# Patient Record
Sex: Male | Born: 1945 | Race: White | Hispanic: No | Marital: Single | Smoking: Current some day smoker
Health system: Southern US, Community
[De-identification: ages and names within clinical notes are randomized; demographics above are authoritative.]

## PROBLEM LIST (undated history)

## (undated) DIAGNOSIS — C801 Malignant (primary) neoplasm, unspecified: Secondary | ICD-10-CM

## (undated) DIAGNOSIS — R29898 Other symptoms and signs involving the musculoskeletal system: Secondary | ICD-10-CM

## (undated) DIAGNOSIS — I7121 Aneurysm of the ascending aorta, without rupture: Secondary | ICD-10-CM

## (undated) DIAGNOSIS — I10 Essential (primary) hypertension: Secondary | ICD-10-CM

## (undated) DIAGNOSIS — I5022 Chronic systolic (congestive) heart failure: Secondary | ICD-10-CM

## (undated) DIAGNOSIS — I4891 Unspecified atrial fibrillation: Secondary | ICD-10-CM

## (undated) DIAGNOSIS — J392 Other diseases of pharynx: Secondary | ICD-10-CM

## (undated) DIAGNOSIS — I429 Cardiomyopathy, unspecified: Secondary | ICD-10-CM

## (undated) DIAGNOSIS — I4892 Unspecified atrial flutter: Secondary | ICD-10-CM

## (undated) DIAGNOSIS — I712 Thoracic aortic aneurysm, without rupture: Secondary | ICD-10-CM

## (undated) DIAGNOSIS — I6521 Occlusion and stenosis of right carotid artery: Secondary | ICD-10-CM

## (undated) DIAGNOSIS — C61 Malignant neoplasm of prostate: Secondary | ICD-10-CM

## (undated) DIAGNOSIS — R1310 Dysphagia, unspecified: Secondary | ICD-10-CM

## (undated) DIAGNOSIS — G459 Transient cerebral ischemic attack, unspecified: Secondary | ICD-10-CM

## (undated) DIAGNOSIS — C760 Malignant neoplasm of head, face and neck: Secondary | ICD-10-CM

## (undated) DIAGNOSIS — IMO0002 Reserved for concepts with insufficient information to code with codable children: Secondary | ICD-10-CM

## (undated) HISTORY — DX: Cardiomyopathy, unspecified: I42.9

## (undated) HISTORY — DX: Malignant neoplasm of prostate: C61

## (undated) HISTORY — DX: Aneurysm of the ascending aorta, without rupture: I71.21

## (undated) HISTORY — DX: Transient cerebral ischemic attack, unspecified: G45.9

## (undated) HISTORY — DX: Chronic systolic (congestive) heart failure: I50.22

## (undated) HISTORY — DX: Dysphagia, unspecified: R13.10

## (undated) HISTORY — DX: Occlusion and stenosis of right carotid artery: I65.21

## (undated) HISTORY — DX: Malignant neoplasm of head, face and neck: C76.0

## (undated) HISTORY — DX: Other diseases of pharynx: J39.2

## (undated) HISTORY — DX: Other symptoms and signs involving the musculoskeletal system: R29.898

## (undated) HISTORY — DX: Thoracic aortic aneurysm, without rupture: I71.2

---

## 2000-12-09 DIAGNOSIS — C61 Malignant neoplasm of prostate: Secondary | ICD-10-CM

## 2000-12-09 HISTORY — PX: PROSTATECTOMY: SHX69

## 2000-12-09 HISTORY — DX: Malignant neoplasm of prostate: C61

## 2011-09-09 DIAGNOSIS — IMO0001 Reserved for inherently not codable concepts without codable children: Secondary | ICD-10-CM

## 2011-09-09 HISTORY — DX: Reserved for inherently not codable concepts without codable children: IMO0001

## 2014-10-25 ENCOUNTER — Encounter (HOSPITAL_COMMUNITY): Payer: Self-pay | Admitting: Emergency Medicine

## 2014-10-25 ENCOUNTER — Emergency Department (HOSPITAL_COMMUNITY): Payer: Medicare Other

## 2014-10-25 ENCOUNTER — Emergency Department (HOSPITAL_COMMUNITY)
Admission: EM | Admit: 2014-10-25 | Discharge: 2014-10-25 | Disposition: A | Discharge status: Home / Self Care | Payer: Medicare Other | Attending: Emergency Medicine | Admitting: Emergency Medicine

## 2014-10-25 DIAGNOSIS — Z859 Personal history of malignant neoplasm, unspecified: Secondary | ICD-10-CM | POA: Insufficient documentation

## 2014-10-25 DIAGNOSIS — I10 Essential (primary) hypertension: Secondary | ICD-10-CM | POA: Diagnosis not present

## 2014-10-25 DIAGNOSIS — Z79899 Other long term (current) drug therapy: Secondary | ICD-10-CM | POA: Insufficient documentation

## 2014-10-25 DIAGNOSIS — I4892 Unspecified atrial flutter: Secondary | ICD-10-CM | POA: Diagnosis not present

## 2014-10-25 DIAGNOSIS — Z880 Allergy status to penicillin: Secondary | ICD-10-CM | POA: Insufficient documentation

## 2014-10-25 DIAGNOSIS — R000 Tachycardia, unspecified: Secondary | ICD-10-CM | POA: Diagnosis present

## 2014-10-25 HISTORY — DX: Essential (primary) hypertension: I10

## 2014-10-25 HISTORY — DX: Unspecified atrial fibrillation: I48.91

## 2014-10-25 HISTORY — DX: Unspecified atrial flutter: I48.92

## 2014-10-25 HISTORY — DX: Reserved for concepts with insufficient information to code with codable children: IMO0002

## 2014-10-25 HISTORY — DX: Malignant (primary) neoplasm, unspecified: C80.1

## 2014-10-25 LAB — COMPREHENSIVE METABOLIC PANEL
ALT: 28 U/L (ref 0–53)
ANION GAP: 15 (ref 5–15)
AST: 34 U/L (ref 0–37)
Albumin: 3.6 g/dL (ref 3.5–5.2)
Alkaline Phosphatase: 79 U/L (ref 39–117)
BUN: 10 mg/dL (ref 6–23)
CHLORIDE: 100 meq/L (ref 96–112)
CO2: 24 mEq/L (ref 19–32)
CREATININE: 0.82 mg/dL (ref 0.50–1.35)
Calcium: 9.1 mg/dL (ref 8.4–10.5)
GFR calc Af Amer: >90 mL/min (ref >90)
GFR, EST NON AFRICAN AMERICAN: 89 mL/min — AB (ref >90)
GLUCOSE: 94 mg/dL (ref 70–99)
Potassium: 4.1 mEq/L (ref 3.7–5.3)
Sodium: 139 mEq/L (ref 137–147)
Total Bilirubin: 0.8 mg/dL (ref 0.3–1.2)
Total Protein: 6.7 g/dL (ref 6.0–8.3)

## 2014-10-25 LAB — CBC WITH DIFFERENTIAL/PLATELET
Basophils Absolute: 0 10*3/uL (ref 0.0–0.1)
Basophils Relative: 0 % (ref 0–1)
Eosinophils Absolute: 0.1 10*3/uL (ref 0.0–0.7)
Eosinophils Relative: 1 % (ref 0–5)
HEMATOCRIT: 45.1 % (ref 39.0–52.0)
HEMOGLOBIN: 15.5 g/dL (ref 13.0–17.0)
LYMPHS ABS: 0.8 10*3/uL (ref 0.7–4.0)
LYMPHS PCT: 13 % (ref 12–46)
MCH: 35.1 pg — ABNORMAL HIGH (ref 26.0–34.0)
MCHC: 34.4 g/dL (ref 30.0–36.0)
MCV: 102.3 fL — ABNORMAL HIGH (ref 78.0–100.0)
MONO ABS: 0.9 10*3/uL (ref 0.1–1.0)
MONOS PCT: 14 % — AB (ref 3–12)
NEUTROS ABS: 4.3 10*3/uL (ref 1.7–7.7)
Neutrophils Relative %: 72 % (ref 43–77)
Platelets: 127 10*3/uL — ABNORMAL LOW (ref 150–400)
RBC: 4.41 MIL/uL (ref 4.22–5.81)
RDW: 12.7 % (ref 11.5–15.5)
WBC: 6 10*3/uL (ref 4.0–10.5)

## 2014-10-25 LAB — I-STAT TROPONIN, ED: Troponin i, poc: 0.01 ng/mL (ref 0.00–0.08)

## 2014-10-25 MED ORDER — SODIUM CHLORIDE 0.9 % IV BOLUS (SEPSIS)
1000.0000 mL | Freq: Once | INTRAVENOUS | Status: DC
Start: 2014-10-25 — End: 2014-10-25

## 2014-10-25 MED ORDER — METOPROLOL TARTRATE 25 MG PO TABS: 25.0000 mg | ORAL_TABLET | Freq: Once | ORAL | Status: DC

## 2014-10-25 MED ORDER — METOPROLOL TARTRATE 25 MG PO TABS
12.5000 mg | ORAL_TABLET | Freq: Once | ORAL | Status: AC
  Administered 2014-10-25: 12.5 mg via ORAL
  Filled 2014-10-25: qty 1

## 2014-10-25 MED ORDER — METOPROLOL TARTRATE 25 MG PO TABS: 12.5000 mg | ORAL_TABLET | Freq: Every day | ORAL | Status: DC

## 2014-10-25 NOTE — Discharge Instructions (Signed)
Atrial Flutter °Atrial flutter is a heart rhythm that can cause the heart to beat very fast (tachycardia). It originates in the upper chambers of the heart (atria). In atrial flutter, the top chambers of the heart (atria) often beat much faster than the bottom chambers of the heart (ventricles). Atrial flutter has a regular "saw toothed" appearance in an EKG readout. An EKG is a test that records the electrical activity of the heart. Atrial flutter can cause the heart to beat up to 150 beats per minute (BPM). Atrial flutter can either be short lived (paroxysmal) or permanent.  °CAUSES  °Causes of atrial flutter can be many. Some of these include: °· Heart related issues: °¨ Heart attack (myocardial infarction). °¨ Heart failure. °¨ Heart valve problems. °¨ Poorly controlled high blood pressure (hypertension). °¨ After open heart surgery. °· Lung related issues: °¨ A blood clot in the lungs (pulmonary embolism). °¨ Chronic obstructive pulmonary disease (COPD). Medications used to treat COPD can attribute to atrial flutter. °· Other related causes: °¨ Hyperthyroidism. °¨ Caffeine. °¨ Some decongestant cold medications. °¨ Low electrolyte levels such as potassium or magnesium. °¨ Cocaine. °SYMPTOMS °· An awareness of your heart beating rapidly (palpitations). °· Shortness of breath. °· Chest pain. °· Low blood pressure (hypotension). °· Dizziness or fainting. °DIAGNOSIS  °Different tests can be performed to diagnose atrial flutter.  °· An EKG. °· Holter monitor. This is a 24-hour recording of your heart rhythm. You will also be given a diary. Write down all symptoms that you have and what you were doing at the time you experienced symptoms. °· Cardiac event monitor. This small device can be worn for up to 30 days. When you have heart symptoms, you will push a button on the device. This will then record your heart rhythm. °· Echocardiogram. This is an imaging test to look at your heart. Your caregiver will look at your  heart valves and the ventricles. °· Stress test. This test can help determine if the atrial flutter is related to exercise or if coronary artery disease is present. °· Laboratory studies will look at certain blood levels like: °¨ Complete blood count (CBC). °¨ Potassium. °¨ Magnesium. °¨ Thyroid function. °TREATMENT  °Treatment of atrial flutter varies. A combination of therapies may be used or sometimes atrial flutter may need only 1 type of treatment.  °Lab work: °If your blood work, such as your electrolytes (potassium, magnesium) or your thyroid function tests, are abnormal, your caregiver will treat them accordingly.  °Medication:  °There are several different types of medications that can convert your heart to a normal rhythm and prevent atrial flutter from reoccurring.  °Nonsurgical procedures: °Nonsurgical techniques may be used to control atrial flutter. Some examples include: °· Cardioversion. This technique uses either drugs or an electrical shock to restore a normal heart rhythm: °¨ Cardioversion drugs may be given through an intravenous (IV) line to help "reset" the heart rhythm. °¨ In electrical cardioversion, your caregiver shocks your heart with electrical energy. This helps to reset the heartbeat to a normal rhythm. °· Ablation. If atrial flutter is a persistent problem, an ablation may be needed. This procedure is done under mild sedation. High frequency radio-wave energy is used to destroy the area of heart tissue responsible for atrial flutter. °SEEK IMMEDIATE MEDICAL CARE IF:  °You have: °· Dizziness. °· Near fainting or fainting. °· Shortness of breath. °· Chest pain or pressure. °· Sudden nausea or vomiting. °· Profuse sweating. °If you have the above symptoms,   call your local emergency service immediately! Do not drive yourself to the hospital. °MAKE SURE YOU:  °· Understand these instructions. °· Will watch your condition. °· Will get help right away if you are not doing well or get  worse. °Document Released: 04/13/2009 Document Revised: 04/11/2014 Document Reviewed: 04/13/2009 °[employer]® Patient Information ©2015 [employer]. This information is not intended to replace advice given to you by your health care provider. Make sure you discuss any questions you have with your health care provider. ° °

## 2014-10-25 NOTE — ED Provider Notes (Signed)
CSN: [id number]     Arrival date & time 10/25/14  1515 History   First MD Initiated Contact with Patient 10/25/14 1517     Chief Complaint  Patient presents with  . Tachycardia     (Consider location/radiation/quality/duration/timing/severity/associated sxs/prior Treatment) Patient is a 68 y.o. male presenting with dizziness. The history is provided by the patient. No language interpreter was used.  Dizziness Quality:  Lightheadedness Severity:  Moderate Onset quality:  Gradual Duration:  3 days Timing:  Constant Progression:  Resolved Chronicity:  Recurrent Context: bending over   Relieved by:  None tried Worsened by:  Standing up Ineffective treatments:  None tried Associated symptoms: no blood in stool, no chest pain, no headaches, no palpitations, no shortness of breath, no vision changes and no weakness   Risk factors: no hx of vertigo   Risk factors comment:  Missed metoprolol x 3 days   Past Medical History  Diagnosis Date  . A-fib   . Atrial flutter   . Hypertension   . Cancer     behind L ear  . Radiation    History reviewed. No pertinent past surgical history. History reviewed. No pertinent family history. History  Substance Use Topics  . Smoking status: Never Smoker   . Smokeless tobacco: Never Used  . Alcohol Use: Not on file    Review of Systems  Respiratory: Negative for shortness of breath.   Cardiovascular: Negative for chest pain and palpitations.  Gastrointestinal: Negative for blood in stool.  Neurological: Positive for dizziness. Negative for headaches.      Allergies  Penicillins  Home Medications   Prior to Admission medications   Medication Sig Start Date End Date Taking? Authorizing Provider  metoprolol tartrate (LOPRESSOR) 25 MG tablet Take 25 mg by mouth 2 (two) times daily.   Yes Historical Provider, MD   BP 117/78 mmHg  Pulse 73  Temp(Src) 98.3 F (36.8 C) (Oral)  SpO2 99% Physical Exam  Constitutional: He is oriented  to person, place, and time. He appears well-developed and well-nourished. No distress.  HENT:  Head: Normocephalic and atraumatic.  Eyes: EOM are normal. Pupils are equal, round, and reactive to light.  Neck: No JVD present.  Cardiovascular: Normal rate, regular rhythm, normal heart sounds and intact distal pulses.   Pulmonary/Chest: Effort normal and breath sounds normal.  Abdominal: Soft. He exhibits no distension. There is no tenderness.  Musculoskeletal: He exhibits no edema.  Neurological: He is alert and oriented to person, place, and time.  Skin: Skin is warm.  Vitals reviewed.   ED Course  Procedures (including critical care time) Labs Review Labs Reviewed  CBC WITH DIFFERENTIAL - Abnormal; Notable for the following:    MCV 102.3 (*)    MCH 35.1 (*)    Platelets 127 (*)    Monocytes Relative 14 (*)    All other components within normal limits  COMPREHENSIVE METABOLIC PANEL - Abnormal; Notable for the following:    GFR calc non Af Amer 89 (*)    All other components within normal limits  Randolm Idol, ED    Imaging Review Dg Chest Port 1 View  10/25/2014   CLINICAL DATA:  68 year old male with syncope, shortness of breath, fever and productive cough.  EXAM: PORTABLE CHEST - 1 VIEW  COMPARISON:  None  FINDINGS: Metallic artifact overlies the chest in the formal multiple cardiac leads. Borderline cardiomegaly. Trace atherosclerotic calcification in the transverse aorta. No focal airspace opacity. Left retrocardiac region not well seen. No  evidence of pulmonary edema, pleural effusion or pneumothorax. No acute osseous abnormality.  IMPRESSION: Borderline cardiomegaly.  Aortic atherosclerosis.  No definite focal airspace consolidation. Recommend dedicated PA and lateral chest x-ray when the patient is able in order to clear the left retrocardiac region.   Electronically Signed   By: Jacqulynn Cadet M.D.   On: 10/25/2014 17:01     EKG Interpretation   Date/Time:   Tuesday October 25 2014 15:22:36 EST Ventricular Rate:  77 PR Interval:  207 QRS Duration: 94 QT Interval:  420 QTC Calculation: 475 R Axis:   -14 Text Interpretation:  Sinus rhythm Multiple premature complexes, vent   RSR' in V1 or V2, right VCD or RVH Borderline prolonged QT interval No  previous tracing Confirmed by POLLINA  MD, CHRISTOPHER [id number]) on  10/25/2014 3:41:52 PM      MDM   Final diagnoses:  Episodic atrial flutter    68 y/o male with h/o atrial flutter presenting with episode of dizziness causing him to fall forward. EKG by EMS showing atrial flutter w/ rate 150s. Now resolved to sinus rhythm. Pt now asymptomatic. No chest pain, SOB. Also denies cough, fever, sputum production. He did not hit head and caught self with hands. No wrist/hand pain. Well appearing. Labs reassuring. No recurrence of symptoms or tachycardia. He had been out of metoprolol x 3-4 days. Will give home dose here and provide refill. Will F/u with Cardiology.     Amparo Bristol, MD 10/25/14 0881  Orpah Greek, MD 10/26/14 [phone]

## 2014-10-25 NOTE — Care Management (Addendum)
ED CM received referral from [facility] concerning medication assistance. Reviewed record patient coverage is [employer], No PCP listed  In record. Met with patient at bedside, confirmed information. Patient states, he wants to know will he he received an ED bill or will his insurance company received this bill. Explained that bill can take over 30 days but, he can contact the account payable office for more detailed information on the billing process, and  discussed the process for finding a PCP with [facility].  Patient verbalized understanding and appreciation for the assistance. Updated Courtney, and no further CM needs identified.

## 2014-10-25 NOTE — ED Notes (Signed)
Per EMS pt was at grocery store felt really dizzy while he was walking and fell. Denies LOC & hitting head. EMS report run of SVT en route and converted w/o medicine. Pt has hx of afib/aflutter.

## 2014-10-25 NOTE — ED Provider Notes (Signed)
Patient presented to the ER with sudden onset of dizziness. Patient brought to the emergency department by ambulance. Patient had an episode of atrial fibrillation during transport, resolved prior to arrival. Patient now without symptoms.  Face to face Exam: HEENT - PERRLA Lungs - CTAB Heart - RRR, no M/R/G Abd - S/NT/ND Neuro - alert, oriented x3  Plan: Basic labs, monitor for recurrence of tachycardia.  Orpah Greek, MD 10/25/14 [phone]

## 2015-08-15 ENCOUNTER — Ambulatory Visit: Payer: Self-pay | Admitting: Cardiovascular Disease

## 2016-11-06 ENCOUNTER — Encounter (HOSPITAL_COMMUNITY): Payer: Self-pay | Admitting: Emergency Medicine

## 2016-11-06 ENCOUNTER — Ambulatory Visit (HOSPITAL_COMMUNITY)
Admission: EM | Admit: 2016-11-06 | Discharge: 2016-11-06 | Disposition: A | Discharge status: Home / Self Care | Payer: Medicare Other | Attending: Family Medicine | Admitting: Family Medicine

## 2016-11-06 DIAGNOSIS — H9201 Otalgia, right ear: Secondary | ICD-10-CM | POA: Diagnosis not present

## 2016-11-06 DIAGNOSIS — H6123 Impacted cerumen, bilateral: Secondary | ICD-10-CM

## 2016-11-06 DIAGNOSIS — K047 Periapical abscess without sinus: Secondary | ICD-10-CM

## 2016-11-06 MED ORDER — CLINDAMYCIN HCL 300 MG PO CAPS: 300.0000 mg | ORAL_CAPSULE | Freq: Three times a day (TID) | ORAL | 0 refills | Status: AC

## 2016-11-06 MED ORDER — ATORVASTATIN CALCIUM 20 MG PO TABS: 20.0000 mg | ORAL_TABLET | Freq: Every day | ORAL | 0 refills | Status: AC

## 2016-11-06 MED ORDER — IBUPROFEN 800 MG PO TABS: 800.0000 mg | ORAL_TABLET | Freq: Three times a day (TID) | ORAL | 0 refills | Status: DC

## 2016-11-06 MED ORDER — METOPROLOL SUCCINATE ER 25 MG PO TB24: 12.5000 mg | ORAL_TABLET | Freq: Every day | ORAL | 0 refills | Status: DC

## 2016-11-06 MED ORDER — LISINOPRIL 2.5 MG PO TABS: 2.5000 mg | ORAL_TABLET | Freq: Every day | ORAL | 0 refills | Status: DC

## 2016-11-06 MED ORDER — ATORVASTATIN CALCIUM 20 MG PO TABS: 20.0000 mg | ORAL_TABLET | Freq: Every day | ORAL | 0 refills | Status: DC

## 2016-11-06 NOTE — ED Triage Notes (Signed)
PT pulled his own tooth on the right side a few days ago. PT's hearing on right side has worsened since. PT also reports decreased hearing on left side. PT also report pain in ear

## 2016-11-06 NOTE — ED Provider Notes (Signed)
CSN: [id number]     Arrival date & time 11/06/16  1706 History   First MD Initiated Contact with Patient 11/06/16 1833     Chief Complaint  Patient presents with  . Ear Fullness   (Consider location/radiation/quality/duration/timing/severity/associated sxs/prior Treatment) Patient presents today for right ear pain, right ear feeling full and right side dental pain that radiates up. Patient pulled his own tooth 10 days ago because "it was getting loose" and noticed the ear pain came on 8 days ago. He also reports hearing loss and right ear feels full. Patient denies fever. He endorses difficulty hearing. He denies tinnitus.       Past Medical History:  Diagnosis Date  . A-fib ([place])   . Atrial flutter ([place])   . Cancer ([place])    behind L ear  . Hypertension   . Radiation    History reviewed. No pertinent surgical history. No family history on file. Social History  Substance Use Topics  . Smoking status: Current Some Day Smoker  . Smokeless tobacco: Never Used  . Alcohol use Yes     Comment: 2-3 drinks per day    Review of Systems  Constitutional: Negative for chills, fatigue and fever.  HENT: Positive for dental problem and ear pain. Negative for congestion, ear discharge and tinnitus.        +hearing loss  Respiratory: Negative for cough and shortness of breath.   Cardiovascular: Negative for chest pain, palpitations and leg swelling.  Gastrointestinal: Negative for abdominal pain, nausea and vomiting.  Neurological: Negative for dizziness and headaches.    Allergies  Fish allergy and Penicillins  Home Medications   Prior to Admission medications   Medication Sig Start Date End Date Taking? Authorizing Provider  atorvastatin (LIPITOR) 20 MG tablet Take 1 tablet (20 mg total) by mouth daily. 11/06/16 02/04/17  Barry Dienes, NP  b complex vitamins tablet Take 1 tablet by mouth daily.    Historical Provider, MD  clindamycin (CLEOCIN) 300 MG capsule Take 1 capsule (300 mg  total) by mouth 3 (three) times daily. 11/06/16 11/13/16  Barry Dienes, NP  clonazePAM (KLONOPIN) 0.5 MG tablet Take 0.5 mg by mouth 2 (two) times daily as needed for anxiety.    Historical Provider, MD  ibuprofen (ADVIL,MOTRIN) 800 MG tablet Take 1 tablet (800 mg total) by mouth 3 (three) times daily. 11/06/16   Barry Dienes, NP  lisinopril (ZESTRIL) 2.5 MG tablet Take 1 tablet (2.5 mg total) by mouth daily. 11/06/16 02/04/17  Barry Dienes, NP  metoprolol succinate (TOPROL-XL) 25 MG 24 hr tablet Take 0.5 tablets (12.5 mg total) by mouth daily. 11/06/16 02/04/17  Barry Dienes, NP  metoprolol tartrate (LOPRESSOR) 25 MG tablet Take 0.5 tablets (12.5 mg total) by mouth daily. 10/25/14   Amparo Bristol, MD   Meds Ordered and Administered this Visit  Medications - No data to display  BP (!) 106/54   Pulse 100   Temp 98.6 F (37 C) (Temporal)   Resp 16   Ht 5\' 10"  (1.778 m)   Wt 180 lb (81.6 kg)   SpO2 95%   BMI 25.83 kg/m  No data found.   Physical Exam  Constitutional: He appears well-developed and well-nourished.  HENT:  Has several missing teeth on right lower side. Right lower gumline is red, inflamed and tender to palpate. Has no facial swelling. Cerumen impaction noted bilaterally  Cardiovascular: Normal rate, regular rhythm and normal heart sounds.   Pulmonary/Chest: Effort normal and breath sounds normal.  Abdominal:  Soft. Bowel sounds are normal.  Nursing note and vitals reviewed.   Urgent Care Course   Clinical Course     Procedures (including critical care time)  Labs Review Labs Reviewed - No data to display  Imaging Review No results found.   MDM   1. Dental infection   2. Right ear pain   3. Bilateral impacted cerumen    1) We cleaned your left ear out but unable to clean your right ear out, this is why you feel the fullness in your ear. You need to see an ENT doctor for ear wax removal  2)  You ear pain is most likely coming from your dental pain. I doubt you have  an ear infection.   3) Start taking the antibiotic for the dental infection. Please follow up with your dentist. Take ibuprofen for pain.   4) All you other medications have been refilled per your request.     Barry Dienes, NP 11/06/16 1921

## 2016-11-06 NOTE — Discharge Instructions (Signed)
1) We cleaned your left ear out but unable to clean your right ear out, this is why you feel the fullness in your ear. You need to see an ENT doctor for ear wax removal  2)  You ear pain is most likely coming from your dental pain. I doubt you have an ear infection.   3) Start taking the antibiotic for the dental infection. Please follow up with your dentist  4) All you other medications have been refilled.

## 2016-11-25 ENCOUNTER — Ambulatory Visit (HOSPITAL_COMMUNITY): Payer: Self-pay | Admitting: Dentistry

## 2016-11-25 ENCOUNTER — Encounter (HOSPITAL_COMMUNITY): Payer: Self-pay | Admitting: Dentistry

## 2016-11-25 VITALS — BP 77/57 | HR 77 | Temp 97.5°F

## 2016-11-25 DIAGNOSIS — K0530 Chronic periodontitis, unspecified: Secondary | ICD-10-CM

## 2016-11-25 DIAGNOSIS — Z923 Personal history of irradiation: Secondary | ICD-10-CM | POA: Diagnosis not present

## 2016-11-25 DIAGNOSIS — H9210 Otorrhea, unspecified ear: Secondary | ICD-10-CM | POA: Diagnosis not present

## 2016-11-25 DIAGNOSIS — K0889 Other specified disorders of teeth and supporting structures: Secondary | ICD-10-CM

## 2016-11-25 DIAGNOSIS — K045 Chronic apical periodontitis: Secondary | ICD-10-CM

## 2016-11-25 DIAGNOSIS — K08409 Partial loss of teeth, unspecified cause, unspecified class: Secondary | ICD-10-CM

## 2016-11-25 DIAGNOSIS — K029 Dental caries, unspecified: Secondary | ICD-10-CM

## 2016-11-25 DIAGNOSIS — K06020 Generalized gingival recession, unspecified: Secondary | ICD-10-CM

## 2016-11-25 DIAGNOSIS — K1379 Other lesions of oral mucosa: Secondary | ICD-10-CM

## 2016-11-25 DIAGNOSIS — C801 Malignant (primary) neoplasm, unspecified: Secondary | ICD-10-CM

## 2016-11-25 DIAGNOSIS — Z8589 Personal history of malignant neoplasm of other organs and systems: Secondary | ICD-10-CM | POA: Diagnosis not present

## 2016-11-25 DIAGNOSIS — K083 Retained dental root: Secondary | ICD-10-CM

## 2016-11-25 DIAGNOSIS — K0401 Reversible pulpitis: Secondary | ICD-10-CM

## 2016-11-25 DIAGNOSIS — M264 Malocclusion, unspecified: Secondary | ICD-10-CM

## 2016-11-25 DIAGNOSIS — Z9221 Personal history of antineoplastic chemotherapy: Secondary | ICD-10-CM

## 2016-11-25 DIAGNOSIS — K036 Deposits [accretions] on teeth: Secondary | ICD-10-CM

## 2016-11-26 NOTE — Progress Notes (Signed)
DENTAL CONSULTATION  Date of Consultation:  11/25/2016 Patient Name:   Tyler Holland Date of Birth:   28-Jul-1946 Medical Record Number: [id number]  VITALS: BP (!) 77/57 (BP Location: Left Arm, Cuff Size: Normal)   Pulse 77   Temp 97.5 F (36.4 C) (Oral)   CHIEF COMPLAINT: Patient referred by Dr. Constance Holster for a dental consultation.   HPI: Tyler Holland is a 70 year old male referred by Dr. Constance Holster for evaluation of the lower right swelling.  Patient indicates that he does have a history of acute pulpitis symptoms involving the lower right molar area. Patient points to the area of #32, although there are no teeth noted clinically. This acute pain has been occurring off and on for the past several weeks. Patient indicates that it causes sharp pain that reaches intensity of 9 out of 10. Pain last for hours at a time. Patient indicates it is currently 2 out of 10 in intensity. Patient also gives a history of having pulled a lower right tooth by himself approximately 3-4 weeks ago. Prior to that, the patient indicates that he had three teeth pulled from the lower left quadrant approximately 4 months ago. This was with Dr. Jerold Coombe. Dr. Dale [place] office was contacted and this treatment was actually found to have been done on 10/05/2014.  Patient indicates that he no longer has partial dentures. Last partial dentures were worn approximately 5 years ago. 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 This was approximately 5 years ago. This was evaluated and treated with chemoradiation therapy in [place], [place]. Patient indicates that he had 7 weeks of radiation therapy with 2 cycles of chemotherapy.  Patient indicates that he was treated by Dr. Guinevere Scarlet (Medical Oncology) at the [facility] in [place], [place] and is associated with the [facility] center. Release of information was obtained today to attempt to find records for this head and neck cancer and the treatment provided.  PROBLEM LIST: Patient Active Problem List   Diagnosis Date Noted  . Otorrhea 11/28/2016  o  PMH: Past Medical History:  Diagnosis Date  . A-fib ([place])   . Atrial flutter ([place])   . Cancer ([place])    behind L ear  . Hypertension   . Prostate cancer ([place]) 2002  . Status post radiation 09/2011    PSH: Past Surgical History:  Procedure Laterality Date  . PROSTATECTOMY  2002    ALLERGIES: Allergies  Allergen Reactions  . Fish Allergy Swelling    Fresh water fish caused throat to swell (child)  . Penicillins Other (See Comments)    Unknown childhood allergic reaction    MEDICATIONS: Current Outpatient Prescriptions  Medication Sig Dispense Refill  . atorvastatin (LIPITOR) 20 MG tablet Take 1 tablet (20 mg total) by mouth daily. 90 tablet 0  . b complex vitamins tablet Take 1 tablet by mouth daily.    . clonazePAM (KLONOPIN) 0.5 MG tablet Take 0.5 mg by mouth 2 (two) times daily as needed for anxiety.    Marland Kitchen lisinopril (ZESTRIL) 2.5 MG tablet Take 1 tablet (2.5 mg total) by mouth daily. 90 tablet 0  . metoprolol succinate (TOPROL-XL) 25 MG 24 hr tablet Take 0.5 tablets (12.5 mg total) by mouth daily. 45 tablet 0  . ibuprofen (ADVIL,MOTRIN) 800 MG tablet Take 1 tablet (800 mg total)  by mouth 3 (three) times daily. (Patient not taking: Reported on 11/25/2016) 21 tablet 0  . metoprolol tartrate (LOPRESSOR) 25 MG tablet Take 0.5 tablets (12.5 mg total) by mouth daily. 30 tablet 0   No current facility-administered medications for this visit.     LABS: Lab Results  Component Value Date   WBC 6.0 10/25/2014   HGB 15.5 10/25/2014   HCT 45.1 10/25/2014   MCV 102.3 (H) 10/25/2014   PLT 127 (L) 10/25/2014      Component Value Date/Time   NA 139 10/25/2014 1531   K 4.1 10/25/2014 1531   CL 100 10/25/2014 1531   CO2 24  10/25/2014 1531   GLUCOSE 94 10/25/2014 1531   BUN 10 10/25/2014 1531   CREATININE 0.82 10/25/2014 1531   CALCIUM 9.1 10/25/2014 1531   GFRNONAA 89 (L) 10/25/2014 1531   GFRAA >90 10/25/2014 1531   No results found for: INR, PROTIME No results found for: PTT  SOCIAL HISTORY: Social History   Social History  . Marital status: Single    Spouse name: N/A  . Number of children: 0  . Years of education: N/A   Occupational History  . Not on file.   Social History Main Topics  . Smoking status: Current Some Day Smoker  . Smokeless tobacco: Never Used  . Alcohol use Yes     Comment: 2-3 drinks per day  . Drug use: No  . Sexual activity: Not on file   Other Topics Concern  . Not on file   Social History Narrative  . No narrative on file    FAMILY HISTORY: History reviewed. No pertinent family history.  REVIEW OF SYSTEMS: Reviewed with the patient has for history of present illness.  Psych: Patient indicates that he does have dental phobia.  DENTAL HISTORY: CHIEF COMPLAINT: Patient referred by Dr. Constance Holster for a dental consultation.   HPI: Tyler Holland is a 70 year old male referred by Dr. Constance Holster for evaluation of the lower right swelling.  Patient indicates that he does have a history of acute pulpitis symptoms involving the lower right molar area. Patient points to the area of #32, although there are no teeth noted clinically. This acute pain has been occurring off and on for the past several weeks. Patient indicates that it causes sharp pain that reaches intensity of 9 out of 10. Pain last for hours at a time. Patient indicates it is currently 2 out of 10 in intensity. Patient also gives a history of having pulled a lower right tooth by himself approximately 3-4 weeks ago. Prior to that, the patient indicates that he had three teeth pulled from the lower left quadrant approximately 4 months ago. This was with Dr. Jerold Coombe. Dr. Dale [place] office was contacted and this  treatment was actually found to have been done on 10/05/2014.  Patient indicates that he no longer has partial dentures. Last partial dentures were worn approximately 5 years ago. 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 This was approximately 5 years ago. This was evaluated and treated with chemoradiation therapy in [place], [place]. Patient indicates that he had 7 weeks of radiation therapy with 2 cycles of chemotherapy.  Patient indicates that he was treated by Dr. Guinevere Scarlet (Medical Oncology) at the [facility] in [place], [place] and is associated with the [facility]. Release of information was obtained today to attempt to find records for this head and neck cancer and the treatment provided.  DENTAL EXAMINATION: GENERAL: The patient well-developed, well-nourished male in no acute distress. HEAD AND NECK: There is a fullness to the right neck area with probable lymphadenopathy. No left neck lymphadenopathy is palpated. INTRAORAL EXAM: Patient has normal saliva. There appears to be an oral mass involving the lower right quadrant from area numbers 17 and extending anteriorly to the #24/25 area on the lingual. Patient indicates that this area was not biopsied by anyone recently. DENTITION: Patient is missing tooth numbers 1-3, 7,8,13-20, 24, and 30-32 clinically. There are retained root segments in the area of tooth numbers 9, 10, 12, 23, and 25. Patient was unable to stand steady for the orthopantogram. Patient would not tolerate the full series of intraoral radiographs today. PERIODONTAL: Patient has chronic periodontitis with plaque and calculus Q relations, generalized gingival recession, generalized tooth mobility as per dental charting form. There is moderate to severe bone loss noted. DENTAL CARIES/SUBOPTIMAL  RESTORATIONS: Patient has multiple dental caries noted as per dental charting form. ENDODONTIC: Patient has a history of acute pulpitis symptoms. Patient has multiple areas of periapical pathology and radiolucency. CROWN AND BRIDGE: There are no crown or bridge restorations noted. PROSTHODONTIC: Patient does not have partial dentures at this time. Patient does have a history of previous partial denture fabrication proximally 5 years ago. OCCLUSION: Patient has a poor occlusal scheme secondary to multiple missing teeth, multiple retained root segments, and lack of replacement of missing teeth with dental prostheses.  RADIOGRAPHIC INTERPRETATION: An orthopantogram was unable to be obtained. Patient would not allow full series of dental radiographs. 4 periapical radiographs were obtained. There are multiple missing teeth. There is moderate to severe bone loss. There multiple areas of periapical pathology and radiolucency. Multiple dental caries are noted. Multiple retained root segments are noted.   ASSESSMENTS: 1. Otorrhea with current evaluation and treatment by Dr. Constance Holster 2. History of head and neck cancer in 2012 by patient report. 3. Status post chemoradiation therapy by patient report in 2012.  4. History of acute pulpitis symptoms 5. Chronic apical periodontitis 6. Dental caries 7. Multiple retained root segments 8. Chronic periodontitis with bone loss 9. Tooth mobility 10. Gingival recession 11. Accretions  12. Multiple missing teeth 13. Malocclusion 14. Oral mass involving lower right quadrant.  PLAN/RECOMMENDATIONS: 1. I discussed the risks, benefits, and complications of various treatment options with the patient in relationship to his medical and dental conditions. We discussed various treatment options to include referral to [facility] for biopsy of oral mass, referral for CT scan as needed, no treatment, multiple extractions with alveoloplasty, pre-prosthetic surgery as indicated,  periodontal therapy, dental restorations, root canal therapy, crown and bridge therapy, implant therapy, and replacement of missing teeth as indicated. The patient currently wishes to think about his options. He currently is adamant about not proceeding with referral back to Dr. Constance Holster for the oral biopsy and CT scanning studies as needed.  Patient is aware that ideally patient needs extraction of remaining teeth with alveoloplasty in the operating room with general anesthesia. Patient is aware that previous radiation doses need to be known prior to these dental extractions. If water as scheduled, he will also need cardiac clearance. Patient alternatively can be referred to an oral surgeon for treatment as indicated. Patient currently refuses all these options at this time and will contact dental medicine when he is ready to proceed  with any kind of dental treatment.   2. Discussion of findings with medical team and coordination of future medical and dental care as needed.  I'll attempt to determine if Dr. Janeice Robinson office has sent for the previous head and neck cancer treatment records from Dr. Barrington Ellison in [place].  I spent in excess of  120 minutes during the conduct of this consultation and >50% of this time involved direct face-to-face encounter for counseling and/or coordination of the patient's care.    Lenn Cal, DDS

## 2016-11-26 NOTE — Patient Instructions (Signed)
The patient was instructed to contact Dental Medicine when he makes up his mind on the plan of care he wishes to proceed with.  Dr. Enrique Sack

## 2016-11-28 ENCOUNTER — Encounter (HOSPITAL_COMMUNITY): Payer: Self-pay | Admitting: Dentistry

## 2016-11-28 DIAGNOSIS — H9210 Otorrhea, unspecified ear: Secondary | ICD-10-CM | POA: Insufficient documentation

## 2016-12-09 DIAGNOSIS — G459 Transient cerebral ischemic attack, unspecified: Secondary | ICD-10-CM

## 2016-12-09 HISTORY — DX: Transient cerebral ischemic attack, unspecified: G45.9

## 2016-12-31 ENCOUNTER — Emergency Department (HOSPITAL_COMMUNITY): Payer: Medicare Other

## 2016-12-31 ENCOUNTER — Encounter (HOSPITAL_COMMUNITY): Payer: Self-pay | Admitting: Emergency Medicine

## 2016-12-31 ENCOUNTER — Inpatient Hospital Stay (HOSPITAL_COMMUNITY)
Admission: EM | Admit: 2016-12-31 | Discharge: 2017-01-08 | DRG: 069 | Disposition: A | Discharge status: Skilled Nursing Facility | Payer: Medicare Other | Attending: Internal Medicine | Admitting: Internal Medicine

## 2016-12-31 ENCOUNTER — Ambulatory Visit (HOSPITAL_COMMUNITY)
Admission: EM | Admit: 2016-12-31 | Discharge: 2016-12-31 | Disposition: A | Discharge status: Home / Self Care | Payer: Medicare Other | Attending: Family Medicine | Admitting: Family Medicine

## 2016-12-31 ENCOUNTER — Encounter (HOSPITAL_COMMUNITY): Payer: Self-pay | Admitting: *Deleted

## 2016-12-31 DIAGNOSIS — R1310 Dysphagia, unspecified: Secondary | ICD-10-CM

## 2016-12-31 DIAGNOSIS — Y92099 Unspecified place in other non-institutional residence as the place of occurrence of the external cause: Secondary | ICD-10-CM

## 2016-12-31 DIAGNOSIS — I959 Hypotension, unspecified: Secondary | ICD-10-CM | POA: Diagnosis present

## 2016-12-31 DIAGNOSIS — W19XXXA Unspecified fall, initial encounter: Secondary | ICD-10-CM | POA: Diagnosis present

## 2016-12-31 DIAGNOSIS — R531 Weakness: Secondary | ICD-10-CM

## 2016-12-31 DIAGNOSIS — Z880 Allergy status to penicillin: Secondary | ICD-10-CM

## 2016-12-31 DIAGNOSIS — E860 Dehydration: Secondary | ICD-10-CM | POA: Diagnosis present

## 2016-12-31 DIAGNOSIS — E871 Hypo-osmolality and hyponatremia: Secondary | ICD-10-CM | POA: Diagnosis present

## 2016-12-31 DIAGNOSIS — K0401 Reversible pulpitis: Secondary | ICD-10-CM | POA: Diagnosis present

## 2016-12-31 DIAGNOSIS — I10 Essential (primary) hypertension: Secondary | ICD-10-CM | POA: Diagnosis present

## 2016-12-31 DIAGNOSIS — I420 Dilated cardiomyopathy: Secondary | ICD-10-CM | POA: Diagnosis present

## 2016-12-31 DIAGNOSIS — Z66 Do not resuscitate: Secondary | ICD-10-CM | POA: Diagnosis present

## 2016-12-31 DIAGNOSIS — Z91013 Allergy to seafood: Secondary | ICD-10-CM

## 2016-12-31 DIAGNOSIS — R296 Repeated falls: Secondary | ICD-10-CM | POA: Diagnosis present

## 2016-12-31 DIAGNOSIS — R29898 Other symptoms and signs involving the musculoskeletal system: Secondary | ICD-10-CM

## 2016-12-31 DIAGNOSIS — I7121 Aneurysm of the ascending aorta, without rupture: Secondary | ICD-10-CM | POA: Diagnosis present

## 2016-12-31 DIAGNOSIS — I4891 Unspecified atrial fibrillation: Secondary | ICD-10-CM | POA: Diagnosis present

## 2016-12-31 DIAGNOSIS — J392 Other diseases of pharynx: Secondary | ICD-10-CM | POA: Diagnosis present

## 2016-12-31 DIAGNOSIS — I429 Cardiomyopathy, unspecified: Secondary | ICD-10-CM

## 2016-12-31 DIAGNOSIS — I471 Supraventricular tachycardia: Secondary | ICD-10-CM | POA: Diagnosis present

## 2016-12-31 DIAGNOSIS — G459 Transient cerebral ischemic attack, unspecified: Secondary | ICD-10-CM | POA: Diagnosis not present

## 2016-12-31 DIAGNOSIS — I6521 Occlusion and stenosis of right carotid artery: Secondary | ICD-10-CM | POA: Diagnosis present

## 2016-12-31 DIAGNOSIS — R251 Tremor, unspecified: Secondary | ICD-10-CM | POA: Diagnosis present

## 2016-12-31 DIAGNOSIS — Y92009 Unspecified place in unspecified non-institutional (private) residence as the place of occurrence of the external cause: Principal | ICD-10-CM

## 2016-12-31 DIAGNOSIS — I4892 Unspecified atrial flutter: Secondary | ICD-10-CM | POA: Diagnosis present

## 2016-12-31 DIAGNOSIS — F1020 Alcohol dependence, uncomplicated: Secondary | ICD-10-CM | POA: Diagnosis present

## 2016-12-31 DIAGNOSIS — E43 Unspecified severe protein-calorie malnutrition: Secondary | ICD-10-CM | POA: Diagnosis present

## 2016-12-31 DIAGNOSIS — R0989 Other specified symptoms and signs involving the circulatory and respiratory systems: Secondary | ICD-10-CM

## 2016-12-31 DIAGNOSIS — Z9221 Personal history of antineoplastic chemotherapy: Secondary | ICD-10-CM

## 2016-12-31 DIAGNOSIS — I110 Hypertensive heart disease with heart failure: Secondary | ICD-10-CM | POA: Diagnosis present

## 2016-12-31 DIAGNOSIS — I714 Abdominal aortic aneurysm, without rupture: Secondary | ICD-10-CM | POA: Diagnosis present

## 2016-12-31 DIAGNOSIS — Z23 Encounter for immunization: Secondary | ICD-10-CM

## 2016-12-31 DIAGNOSIS — I5043 Acute on chronic combined systolic (congestive) and diastolic (congestive) heart failure: Secondary | ICD-10-CM | POA: Diagnosis present

## 2016-12-31 DIAGNOSIS — I712 Thoracic aortic aneurysm, without rupture: Secondary | ICD-10-CM | POA: Diagnosis present

## 2016-12-31 DIAGNOSIS — K045 Chronic apical periodontitis: Secondary | ICD-10-CM | POA: Diagnosis present

## 2016-12-31 DIAGNOSIS — F17210 Nicotine dependence, cigarettes, uncomplicated: Secondary | ICD-10-CM | POA: Diagnosis present

## 2016-12-31 DIAGNOSIS — E785 Hyperlipidemia, unspecified: Secondary | ICD-10-CM | POA: Diagnosis present

## 2016-12-31 DIAGNOSIS — Z923 Personal history of irradiation: Secondary | ICD-10-CM

## 2016-12-31 DIAGNOSIS — C099 Malignant neoplasm of tonsil, unspecified: Secondary | ICD-10-CM | POA: Diagnosis present

## 2016-12-31 DIAGNOSIS — C760 Malignant neoplasm of head, face and neck: Secondary | ICD-10-CM

## 2016-12-31 DIAGNOSIS — C779 Secondary and unspecified malignant neoplasm of lymph node, unspecified: Secondary | ICD-10-CM | POA: Diagnosis present

## 2016-12-31 DIAGNOSIS — K029 Dental caries, unspecified: Secondary | ICD-10-CM | POA: Diagnosis present

## 2016-12-31 DIAGNOSIS — Z79899 Other long term (current) drug therapy: Secondary | ICD-10-CM

## 2016-12-31 DIAGNOSIS — Z8546 Personal history of malignant neoplasm of prostate: Secondary | ICD-10-CM

## 2016-12-31 LAB — COMPREHENSIVE METABOLIC PANEL
ALK PHOS: 75 U/L (ref 38–126)
ALT: 15 U/L — AB (ref 17–63)
AST: 22 U/L (ref 15–41)
Albumin: 3.2 g/dL — ABNORMAL LOW (ref 3.5–5.0)
Anion gap: 11 (ref 5–15)
BILIRUBIN TOTAL: 1.3 mg/dL — AB (ref 0.3–1.2)
BUN: 12 mg/dL (ref 6–20)
CALCIUM: 9.3 mg/dL (ref 8.9–10.3)
CO2: 26 mmol/L (ref 22–32)
CREATININE: 0.88 mg/dL (ref 0.61–1.24)
Chloride: 97 mmol/L — ABNORMAL LOW (ref 101–111)
GFR calc non Af Amer: >60 mL/min (ref >60)
Glucose, Bld: 87 mg/dL (ref 65–99)
Potassium: 4.3 mmol/L (ref 3.5–5.1)
Sodium: 134 mmol/L — ABNORMAL LOW (ref 135–145)
Total Protein: 6.8 g/dL (ref 6.5–8.1)

## 2016-12-31 LAB — CBC
HCT: 50.1 % (ref 39.0–52.0)
Hemoglobin: 17.1 g/dL — ABNORMAL HIGH (ref 13.0–17.0)
MCH: 34.2 pg — ABNORMAL HIGH (ref 26.0–34.0)
MCHC: 34.1 g/dL (ref 30.0–36.0)
MCV: 100.2 fL — ABNORMAL HIGH (ref 78.0–100.0)
PLATELETS: 174 10*3/uL (ref 150–400)
RBC: 5 MIL/uL (ref 4.22–5.81)
RDW: 12.9 % (ref 11.5–15.5)
WBC: 8.6 10*3/uL (ref 4.0–10.5)

## 2016-12-31 LAB — I-STAT TROPONIN, ED: Troponin i, poc: 0.02 ng/mL (ref 0.00–0.08)

## 2016-12-31 LAB — CK: Total CK: 80 U/L (ref 49–397)

## 2016-12-31 LAB — TROPONIN I: Troponin I: 0.03 ng/mL (ref <0.03)

## 2016-12-31 LAB — LIPASE, BLOOD: Lipase: 19 U/L (ref 11–51)

## 2016-12-31 LAB — I-STAT CG4 LACTIC ACID, ED: Lactic Acid, Venous: 1.76 mmol/L (ref 0.5–1.9)

## 2016-12-31 MED ORDER — SODIUM CHLORIDE 0.9 % IV BOLUS (SEPSIS)
1000.0000 mL | Freq: Once | INTRAVENOUS | Status: AC
  Administered 2016-12-31: 1000 mL via INTRAVENOUS

## 2016-12-31 MED ORDER — DILTIAZEM HCL 100 MG IV SOLR
5.0000 mg/h | Freq: Once | INTRAVENOUS | Status: AC
  Administered 2016-12-31: 5 mg/h via INTRAVENOUS
  Filled 2016-12-31: qty 100

## 2016-12-31 MED ORDER — DILTIAZEM HCL 25 MG/5ML IV SOLN
10.0000 mg | Freq: Once | INTRAVENOUS | Status: AC
  Administered 2016-12-31: 10 mg via INTRAVENOUS
  Filled 2016-12-31: qty 5

## 2016-12-31 MED ORDER — SODIUM CHLORIDE 0.9 % IV SOLN
INTRAVENOUS | Status: DC
  Administered 2017-01-01: 05:00:00 via INTRAVENOUS

## 2016-12-31 MED ORDER — IOPAMIDOL (ISOVUE-300) INJECTION 61%
INTRAVENOUS | Status: AC
  Administered 2016-12-31: 75 mL
  Filled 2016-12-31: qty 75

## 2016-12-31 MED ORDER — SODIUM CHLORIDE 0.9 % IV BOLUS (SEPSIS)
1000.0000 mL | Freq: Once | INTRAVENOUS | Status: AC
  Administered 2017-01-01: 1000 mL via INTRAVENOUS

## 2016-12-31 MED ORDER — SODIUM CHLORIDE 0.9 % IV BOLUS (SEPSIS): 1000.0000 mL | Freq: Once | INTRAVENOUS | Status: DC

## 2016-12-31 NOTE — ED Notes (Signed)
Pt returned to room A2 from [place]; pts friend/family member Antony Haste) updated per patients request

## 2016-12-31 NOTE — ED Triage Notes (Addendum)
The patient said he fell on Saturday twice.  He did not have a way to get to the doctors so he waited for his neice to bring him.  The patient said he tried to catch himself with his left hand and injured his wrist.  He rates his pain 2/10.  He went to his doctor and he suggested he come to the ED to be evaluated for hypotension.  The patient did say he did have diarrhea today.

## 2016-12-31 NOTE — Discharge Instructions (Signed)
Go to the emergency room for further evaluation and work up of your condition.

## 2016-12-31 NOTE — ED Notes (Signed)
Confirmed with Doctor Cardizem order.

## 2016-12-31 NOTE — ED Triage Notes (Addendum)
Patient states that he went to the dentist last week and BP was less than 90 so dentist appointment was canceled. States that on Saturday he fell twice, left leg gave out and he fell. Patient has multiple complaints during triage. BP at this time noted to be 105/65, patient is weak with walking but this is not new for patients. Patient does take BP medication. Does not have PCP.

## 2016-12-31 NOTE — ED Provider Notes (Signed)
CSN: [id number]     Arrival date & time 12/31/16  1116 History   First MD Initiated Contact with Patient 12/31/16 1245     Chief Complaint  Patient presents with  . Fall   (Consider location/radiation/quality/duration/timing/severity/associated sxs/prior Treatment) 71 year old male presents to clinic with chief complaint of low blood pressure and weakness. He states he had a dental appointment 10 days ago and his blood pressure at that time was 88/66. He has been weak and falling at home. He has not been drinking much water, denies recent illness, has been unable to care for himself. He does not have a primary care provider but has been taking various BP medications. He is weak when standing or sitting upright.   The history is provided by the patient.  Fall     Past Medical History:  Diagnosis Date  . A-fib ([place])   . Atrial flutter ([place])   . Cancer ([place])    behind L ear  . Hypertension   . Prostate cancer ([place]) 2002  . Status post radiation 09/2011   Past Surgical History:  Procedure Laterality Date  . PROSTATECTOMY  2002   History reviewed. No pertinent family history. Social History  Substance Use Topics  . Smoking status: Current Some Day Smoker  . Smokeless tobacco: Never Used  . Alcohol use Yes     Comment: 2-3 drinks per day    Review of Systems  Reason unable to perform ROS: as covered in HPI.  All other systems reviewed and are negative.   Allergies  Fish allergy and Penicillins  Home Medications   Prior to Admission medications   Medication Sig Start Date End Date Taking? Authorizing Provider  atorvastatin (LIPITOR) 20 MG tablet Take 1 tablet (20 mg total) by mouth daily. 11/06/16 02/04/17 Yes Barry Dienes, NP  lisinopril (ZESTRIL) 2.5 MG tablet Take 1 tablet (2.5 mg total) by mouth daily. 11/06/16 02/04/17 Yes Barry Dienes, NP  metoprolol succinate (TOPROL-XL) 25 MG 24 hr tablet Take 0.5 tablets (12.5 mg total) by mouth daily. 11/06/16 02/04/17 Yes Barry Dienes,  NP  b complex vitamins tablet Take 1 tablet by mouth daily.    Historical Provider, MD  clonazePAM (KLONOPIN) 0.5 MG tablet Take 0.5 mg by mouth 2 (two) times daily as needed for anxiety.    Historical Provider, MD  ibuprofen (ADVIL,MOTRIN) 800 MG tablet Take 1 tablet (800 mg total) by mouth 3 (three) times daily. Patient not taking: Reported on 11/25/2016 11/06/16   Barry Dienes, NP  metoprolol tartrate (LOPRESSOR) 25 MG tablet Take 0.5 tablets (12.5 mg total) by mouth daily. 10/25/14   Amparo Bristol, MD   Meds Ordered and Administered this Visit  Medications - No data to display  BP 106/65 (BP Location: Left Arm)   Pulse 69   Temp 98 F (36.7 C) (Oral)   Resp 17   SpO2 100%  No data found.   Physical Exam  Constitutional: He appears well-developed.  Ill, appearing unkempt and in poor health  HENT:  Head: Normocephalic and atraumatic.  Neck: No JVD present.  Cardiovascular: Normal rate, regular rhythm and normal heart sounds.   Pulmonary/Chest: Effort normal and breath sounds normal.  Abdominal: Soft. Bowel sounds are normal.  Neurological: He is alert.  Skin: Skin is dry. Capillary refill takes less than 2 seconds. There is pallor.  Psychiatric: He has a normal mood and affect.  Nursing note and vitals reviewed.   Urgent Care Course     Procedures (including  critical care time)  Labs Review Labs Reviewed - No data to display  Imaging Review No results found.   Visual Acuity Review  Right Eye Distance:   Left Eye Distance:   Bilateral Distance:    Right Eye Near:   Left Eye Near:    Bilateral Near:         MDM   1. Fall in home, initial encounter   2. Hypotension, unspecified hypotension type   I believe this patient exceeds scope and care of the urgent care and with consultation of Dr. Juventino Slovak recommend discharge to the emergency department for further evaluation and management.     Barnet Glasgow, NP 12/31/16 1310

## 2016-12-31 NOTE — ED Notes (Signed)
Patient transported to CT 

## 2016-12-31 NOTE — ED Notes (Signed)
Pt reminded that urine sample needed; urinal within reach

## 2017-01-01 ENCOUNTER — Observation Stay (HOSPITAL_BASED_OUTPATIENT_CLINIC_OR_DEPARTMENT_OTHER): Payer: Medicare Other

## 2017-01-01 ENCOUNTER — Observation Stay (HOSPITAL_COMMUNITY): Payer: Medicare Other

## 2017-01-01 ENCOUNTER — Encounter (HOSPITAL_COMMUNITY): Payer: Self-pay

## 2017-01-01 DIAGNOSIS — I110 Hypertensive heart disease with heart failure: Secondary | ICD-10-CM | POA: Diagnosis present

## 2017-01-01 DIAGNOSIS — G451 Carotid artery syndrome (hemispheric): Secondary | ICD-10-CM | POA: Diagnosis not present

## 2017-01-01 DIAGNOSIS — R531 Weakness: Secondary | ICD-10-CM | POA: Diagnosis present

## 2017-01-01 DIAGNOSIS — I6521 Occlusion and stenosis of right carotid artery: Secondary | ICD-10-CM

## 2017-01-01 DIAGNOSIS — E43 Unspecified severe protein-calorie malnutrition: Secondary | ICD-10-CM | POA: Diagnosis present

## 2017-01-01 DIAGNOSIS — I5043 Acute on chronic combined systolic (congestive) and diastolic (congestive) heart failure: Secondary | ICD-10-CM | POA: Diagnosis present

## 2017-01-01 DIAGNOSIS — I712 Thoracic aortic aneurysm, without rupture: Secondary | ICD-10-CM

## 2017-01-01 DIAGNOSIS — I471 Supraventricular tachycardia: Secondary | ICD-10-CM | POA: Diagnosis present

## 2017-01-01 DIAGNOSIS — R251 Tremor, unspecified: Secondary | ICD-10-CM | POA: Insufficient documentation

## 2017-01-01 DIAGNOSIS — C099 Malignant neoplasm of tonsil, unspecified: Secondary | ICD-10-CM | POA: Diagnosis present

## 2017-01-01 DIAGNOSIS — I4891 Unspecified atrial fibrillation: Secondary | ICD-10-CM

## 2017-01-01 DIAGNOSIS — R29898 Other symptoms and signs involving the musculoskeletal system: Secondary | ICD-10-CM

## 2017-01-01 DIAGNOSIS — E785 Hyperlipidemia, unspecified: Secondary | ICD-10-CM | POA: Diagnosis present

## 2017-01-01 DIAGNOSIS — I714 Abdominal aortic aneurysm, without rupture: Secondary | ICD-10-CM | POA: Diagnosis present

## 2017-01-01 DIAGNOSIS — I7121 Aneurysm of the ascending aorta, without rupture: Secondary | ICD-10-CM | POA: Diagnosis present

## 2017-01-01 DIAGNOSIS — I420 Dilated cardiomyopathy: Secondary | ICD-10-CM | POA: Diagnosis present

## 2017-01-01 DIAGNOSIS — K0401 Reversible pulpitis: Secondary | ICD-10-CM | POA: Diagnosis present

## 2017-01-01 DIAGNOSIS — R202 Paresthesia of skin: Secondary | ICD-10-CM

## 2017-01-01 DIAGNOSIS — W19XXXA Unspecified fall, initial encounter: Secondary | ICD-10-CM | POA: Diagnosis not present

## 2017-01-01 DIAGNOSIS — J392 Other diseases of pharynx: Secondary | ICD-10-CM | POA: Diagnosis present

## 2017-01-01 DIAGNOSIS — E860 Dehydration: Secondary | ICD-10-CM | POA: Diagnosis present

## 2017-01-01 DIAGNOSIS — G459 Transient cerebral ischemic attack, unspecified: Secondary | ICD-10-CM | POA: Diagnosis present

## 2017-01-01 DIAGNOSIS — F17210 Nicotine dependence, cigarettes, uncomplicated: Secondary | ICD-10-CM | POA: Diagnosis present

## 2017-01-01 DIAGNOSIS — I959 Hypotension, unspecified: Secondary | ICD-10-CM | POA: Insufficient documentation

## 2017-01-01 DIAGNOSIS — I484 Atypical atrial flutter: Secondary | ICD-10-CM | POA: Diagnosis not present

## 2017-01-01 DIAGNOSIS — C779 Secondary and unspecified malignant neoplasm of lymph node, unspecified: Secondary | ICD-10-CM | POA: Diagnosis present

## 2017-01-01 DIAGNOSIS — R200 Anesthesia of skin: Secondary | ICD-10-CM | POA: Insufficient documentation

## 2017-01-01 DIAGNOSIS — I10 Essential (primary) hypertension: Secondary | ICD-10-CM | POA: Diagnosis present

## 2017-01-01 DIAGNOSIS — R1310 Dysphagia, unspecified: Secondary | ICD-10-CM | POA: Diagnosis present

## 2017-01-01 DIAGNOSIS — Z66 Do not resuscitate: Secondary | ICD-10-CM | POA: Diagnosis present

## 2017-01-01 DIAGNOSIS — I4892 Unspecified atrial flutter: Secondary | ICD-10-CM | POA: Diagnosis present

## 2017-01-01 DIAGNOSIS — E871 Hypo-osmolality and hyponatremia: Secondary | ICD-10-CM | POA: Diagnosis present

## 2017-01-01 DIAGNOSIS — G458 Other transient cerebral ischemic attacks and related syndromes: Secondary | ICD-10-CM | POA: Diagnosis not present

## 2017-01-01 DIAGNOSIS — Z23 Encounter for immunization: Secondary | ICD-10-CM | POA: Diagnosis present

## 2017-01-01 DIAGNOSIS — I429 Cardiomyopathy, unspecified: Secondary | ICD-10-CM | POA: Diagnosis not present

## 2017-01-01 LAB — ECHOCARDIOGRAM COMPLETE
Ao-asc: 38 cm
CHL CUP DOP CALC LVOT VTI: 7.86 cm
CHL CUP MV DEC (S): 111
CHL CUP RV SYS PRESS: 25 mmHg
CHL CUP STROKE VOLUME: 42 mL
E decel time: 111 msec
EERAT: 12.91
FS: 18 % — AB (ref 28–44)
IV/PV OW: 0.94
LA ID, A-P, ES: 47 mm
LA vol A4C: 125 ml
LA vol: 142 mL
LADIAMINDEX: 2.35 cm/m2
LAVOLIN: 71 mL/m2
LEFT ATRIUM END SYS DIAM: 47 mm
LV PW d: 10.6 mm — AB (ref 0.6–1.1)
LV TDI E'LATERAL: 4.53
LV TDI E'MEDIAL: 3.44
LV dias vol index: 76 mL/m2
LV dias vol: 151 mL — AB (ref 62–150)
LV sys vol index: 55 mL/m2
LV sys vol: 109 mL — AB (ref 21–61)
LVEEAVG: 12.91
LVEEMED: 12.91
LVELAT: 4.53 cm/s
LVOT area: 4.52 cm2
LVOT peak vel: 50.9 cm/s
LVOTD: 24 mm
LVOTSV: 36 mL
Lateral S' vel: 8.04 cm/s
MVPKAVEL: 71.7 m/s
MVPKEVEL: 58.5 m/s
Reg peak vel: 207 cm/s
Simpson's disk: 28
TAPSE: 15.8 mm
TRMAXVEL: 207 cm/s

## 2017-01-01 LAB — MAGNESIUM: Magnesium: 1.3 mg/dL — ABNORMAL LOW (ref 1.7–2.4)

## 2017-01-01 LAB — CBC WITH DIFFERENTIAL/PLATELET
BASOS ABS: 0 10*3/uL (ref 0.0–0.1)
Basophils Relative: 0 %
EOS PCT: 1 %
Eosinophils Absolute: 0.1 10*3/uL (ref 0.0–0.7)
HCT: 45.2 % (ref 39.0–52.0)
Hemoglobin: 15.4 g/dL (ref 13.0–17.0)
Lymphocytes Relative: 11 %
Lymphs Abs: 0.8 10*3/uL (ref 0.7–4.0)
MCH: 34 pg (ref 26.0–34.0)
MCHC: 34.1 g/dL (ref 30.0–36.0)
MCV: 99.8 fL (ref 78.0–100.0)
MONO ABS: 0.5 10*3/uL (ref 0.1–1.0)
MONOS PCT: 6 %
Neutro Abs: 5.8 10*3/uL (ref 1.7–7.7)
Neutrophils Relative %: 82 %
PLATELETS: 152 10*3/uL (ref 150–400)
RBC: 4.53 MIL/uL (ref 4.22–5.81)
RDW: 12.8 % (ref 11.5–15.5)
WBC: 7.1 10*3/uL (ref 4.0–10.5)

## 2017-01-01 LAB — COMPREHENSIVE METABOLIC PANEL
ALBUMIN: 2.7 g/dL — AB (ref 3.5–5.0)
ALK PHOS: 60 U/L (ref 38–126)
ALT: 15 U/L — ABNORMAL LOW (ref 17–63)
AST: 21 U/L (ref 15–41)
Anion gap: 9 (ref 5–15)
BILIRUBIN TOTAL: 1 mg/dL (ref 0.3–1.2)
BUN: 10 mg/dL (ref 6–20)
CALCIUM: 8.3 mg/dL — AB (ref 8.9–10.3)
CO2: 23 mmol/L (ref 22–32)
Chloride: 101 mmol/L (ref 101–111)
Creatinine, Ser: 0.68 mg/dL (ref 0.61–1.24)
GFR calc Af Amer: >60 mL/min (ref >60)
GFR calc non Af Amer: >60 mL/min (ref >60)
GLUCOSE: 156 mg/dL — AB (ref 65–99)
Potassium: 3.5 mmol/L (ref 3.5–5.1)
Sodium: 133 mmol/L — ABNORMAL LOW (ref 135–145)
TOTAL PROTEIN: 5.5 g/dL — AB (ref 6.5–8.1)

## 2017-01-01 LAB — TROPONIN I
Troponin I: <0.03 ng/mL (ref <0.03)
Troponin I: <0.03 ng/mL (ref <0.03)

## 2017-01-01 LAB — LIPID PANEL
Cholesterol: 105 mg/dL (ref 0–200)
HDL: 37 mg/dL — AB (ref >40)
LDL CALC: 59 mg/dL (ref 0–99)
Total CHOL/HDL Ratio: 2.8 RATIO
Triglycerides: 47 mg/dL (ref <150)
VLDL: 9 mg/dL (ref 0–40)

## 2017-01-01 LAB — URINALYSIS, ROUTINE W REFLEX MICROSCOPIC
Bilirubin Urine: NEGATIVE
Glucose, UA: NEGATIVE mg/dL
Hgb urine dipstick: NEGATIVE
KETONES UR: 20 mg/dL — AB
Leukocytes, UA: NEGATIVE
Nitrite: NEGATIVE
PH: 5 (ref 5.0–8.0)
PROTEIN: NEGATIVE mg/dL
Specific Gravity, Urine: 1.045 — ABNORMAL HIGH (ref 1.005–1.030)

## 2017-01-01 LAB — HEPARIN LEVEL (UNFRACTIONATED)

## 2017-01-01 LAB — MRSA PCR SCREENING: MRSA by PCR: NEGATIVE

## 2017-01-01 LAB — T4, FREE: FREE T4: 1 ng/dL (ref 0.61–1.12)

## 2017-01-01 LAB — TSH: TSH: 2.81 u[IU]/mL (ref 0.350–4.500)

## 2017-01-01 MED ORDER — ACETAMINOPHEN 160 MG/5ML PO SOLN: 650.0000 mg | ORAL | Status: DC | PRN

## 2017-01-01 MED ORDER — PNEUMOCOCCAL VAC POLYVALENT 25 MCG/0.5ML IJ INJ
0.5000 mL | INJECTION | INTRAMUSCULAR | Status: AC
  Administered 2017-01-04: 0.5 mL via INTRAMUSCULAR
  Filled 2017-01-01: qty 0.5

## 2017-01-01 MED ORDER — METOPROLOL SUCCINATE ER 25 MG PO TB24
12.5000 mg | ORAL_TABLET | Freq: Every day | ORAL | Status: DC
  Administered 2017-01-01 – 2017-01-02 (×2): 12.5 mg via ORAL
  Filled 2017-01-01 (×2): qty 1

## 2017-01-01 MED ORDER — SODIUM CHLORIDE 0.9 % IV BOLUS (SEPSIS): 500.0000 mL | Freq: Once | INTRAVENOUS | Status: DC

## 2017-01-01 MED ORDER — ENOXAPARIN SODIUM 40 MG/0.4ML ~~LOC~~ SOLN
40.0000 mg | SUBCUTANEOUS | Status: DC
  Filled 2017-01-01: qty 0.4

## 2017-01-01 MED ORDER — DEXTROSE 5 % IV SOLN
5.0000 mg/h | INTRAVENOUS | Status: DC
  Administered 2017-01-01: 10 mg/h via INTRAVENOUS
  Administered 2017-01-01: 2.5 mg/h via INTRAVENOUS
  Administered 2017-01-01: 10 mg/h via INTRAVENOUS
  Administered 2017-01-02 – 2017-01-03 (×3): 5 mg/h via INTRAVENOUS
  Filled 2017-01-01 (×5): qty 100

## 2017-01-01 MED ORDER — ENSURE ENLIVE PO LIQD
237.0000 mL | Freq: Two times a day (BID) | ORAL | Status: DC
Start: 2017-01-02 — End: 2017-01-02
  Administered 2017-01-02: 237 mL via ORAL

## 2017-01-01 MED ORDER — AMIODARONE LOAD VIA INFUSION
150.0000 mg | Freq: Once | INTRAVENOUS | Status: AC
  Administered 2017-01-01: 150 mg via INTRAVENOUS
  Filled 2017-01-01: qty 83.34

## 2017-01-01 MED ORDER — ORAL CARE MOUTH RINSE
15.0000 mL | Freq: Two times a day (BID) | OROMUCOSAL | Status: DC
  Administered 2017-01-02 – 2017-01-06 (×9): 15 mL via OROMUCOSAL

## 2017-01-01 MED ORDER — GADOBENATE DIMEGLUMINE 529 MG/ML IV SOLN
15.0000 mL | Freq: Once | INTRAVENOUS | Status: AC | PRN
  Administered 2017-01-01: 15 mL via INTRAVENOUS

## 2017-01-01 MED ORDER — AMIODARONE HCL IN DEXTROSE 360-4.14 MG/200ML-% IV SOLN
30.0000 mg/h | INTRAVENOUS | Status: DC
  Administered 2017-01-01 – 2017-01-03 (×6): 30 mg/h via INTRAVENOUS
  Filled 2017-01-01 (×6): qty 200

## 2017-01-01 MED ORDER — ACETAMINOPHEN 325 MG PO TABS: 650.0000 mg | ORAL_TABLET | ORAL | Status: DC | PRN

## 2017-01-01 MED ORDER — AMIODARONE HCL IN DEXTROSE 360-4.14 MG/200ML-% IV SOLN
60.0000 mg/h | INTRAVENOUS | Status: AC
  Administered 2017-01-01 (×2): 60 mg/h via INTRAVENOUS
  Filled 2017-01-01 (×2): qty 200

## 2017-01-01 MED ORDER — CLONAZEPAM 0.5 MG PO TABS
0.5000 mg | ORAL_TABLET | Freq: Two times a day (BID) | ORAL | Status: DC | PRN
  Administered 2017-01-02 – 2017-01-03 (×3): 0.5 mg via ORAL
  Filled 2017-01-01 (×3): qty 1

## 2017-01-01 MED ORDER — HYDROCODONE-ACETAMINOPHEN 7.5-325 MG PO TABS
0.5000 | ORAL_TABLET | Freq: Four times a day (QID) | ORAL | Status: DC | PRN
  Administered 2017-01-04: 1 via ORAL
  Administered 2017-01-05: 0.5 via ORAL
  Administered 2017-01-05 – 2017-01-08 (×6): 1 via ORAL
  Filled 2017-01-01 (×8): qty 1

## 2017-01-01 MED ORDER — LIDOCAINE VISCOUS 2 % MT SOLN
15.0000 mL | OROMUCOSAL | Status: DC | PRN
  Administered 2017-01-03: 15 mL via OROMUCOSAL
  Filled 2017-01-01 (×5): qty 15

## 2017-01-01 MED ORDER — HEPARIN (PORCINE) IN NACL 100-0.45 UNIT/ML-% IJ SOLN
1350.0000 [IU]/h | INTRAMUSCULAR | Status: DC
  Administered 2017-01-01: 1050 [IU]/h via INTRAVENOUS
  Filled 2017-01-01 (×3): qty 250

## 2017-01-01 MED ORDER — ACETAMINOPHEN 650 MG RE SUPP: 650.0000 mg | RECTAL | Status: DC | PRN

## 2017-01-01 MED ORDER — ONDANSETRON HCL 4 MG PO TABS: 4.0000 mg | ORAL_TABLET | Freq: Four times a day (QID) | ORAL | Status: DC | PRN

## 2017-01-01 MED ORDER — SODIUM CHLORIDE 0.9 % IV BOLUS (SEPSIS)
500.0000 mL | Freq: Once | INTRAVENOUS | Status: AC
  Administered 2017-01-01: 500 mL via INTRAVENOUS

## 2017-01-01 MED ORDER — ATORVASTATIN CALCIUM 10 MG PO TABS
20.0000 mg | ORAL_TABLET | Freq: Every day | ORAL | Status: DC
  Administered 2017-01-01 – 2017-01-05 (×5): 20 mg via ORAL
  Filled 2017-01-01 (×6): qty 1

## 2017-01-01 MED ORDER — ONDANSETRON HCL 4 MG/2ML IJ SOLN: 4.0000 mg | Freq: Four times a day (QID) | INTRAMUSCULAR | Status: DC | PRN

## 2017-01-01 MED ORDER — SODIUM CHLORIDE 0.9 % IV SOLN
INTRAVENOUS | Status: DC
  Administered 2017-01-01 – 2017-01-02 (×3): via INTRAVENOUS

## 2017-01-01 MED ORDER — DILTIAZEM HCL 100 MG IV SOLR
5.0000 mg/h | INTRAVENOUS | Status: DC
  Filled 2017-01-01: qty 100

## 2017-01-01 NOTE — Progress Notes (Signed)
EEG Completed; Results Pending  

## 2017-01-01 NOTE — ED Notes (Signed)
Pt returned from MRI, SPO2 noted to be 85% on room air. Pt placed on 2L of O2 via nasal cannula and improved to 92%. BP also noted to be 95/73, Toprol not administered at this time.

## 2017-01-01 NOTE — Consult Note (Signed)
Cardiology Consult    Patient ID: Tyler Holland MRN: [id number], DOB/AGE: 1946/07/06   Admit date: 12/31/2016 Date of Consult: 01/01/2017  Primary Physician: No PCP Per Patient Reason for Consult: atrial tachycardia Primary Cardiologist: New Requesting Provider: Dr. 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 He presented with fall and left leg tremor, found to be in rapid atrial flutter.   History of Present Illness    [name] had 2 falls at home on Saturday 12/28/16, he had acute onset involuntary, uncontrollable left leg tremor that ultimately causes him to fall.  Both times, he has had increased difficulty getting back on his feet, reporting that it takes him 30 minutes to "recover".  He denies LOC or associated confusion/post-ictal state.  He was found to be in rapid atrial flutter, initially thought to be SVT. He was started on a diltiazem gtt with a 10mg  bolus, however it is documented that he developed so hypotension so this was stopped. There is no documented hypotension however.   He was then started on an amiodarone gtt, and continues to have rapid rates in the 130's. He denies chest pain, palpitations, and lightheadedness. He is ill appearing and somewhat lethargic.   He does have a documented history of atrial fib, however is not on any anticoagulation. He is a current every day smoker and daily drinker.   Past Medical History   Past Medical History:  Diagnosis Date  . A-fib ([place])   . Atrial flutter ([place])   . Cancer ([place])    behind L ear  . Hypertension   . Prostate cancer ([place]) 2002  . Status post radiation 09/2011    Past Surgical History:  Procedure Laterality Date  . PROSTATECTOMY  2002     Allergies  Allergies  Allergen Reactions  . Fish Allergy Swelling    Fresh water fish  caused throat to swell (child)  . Penicillins Other (See Comments)    Unknown childhood allergic reaction    Inpatient Medications    . atorvastatin  20 mg Oral Daily  . enoxaparin (LOVENOX) injection  40 mg Subcutaneous Q24H  . metoprolol succinate  12.5 mg Oral Daily    Family History    History reviewed. No pertinent family history.  Social History    Social History   Social History  . Marital status: Single    Spouse name: N/A  . Number of children: 0  . Years of education: N/A   Occupational History  . Not on file.   Social History Main Topics  . Smoking status: Current Some Day Smoker  . Smokeless tobacco: Never Used  . Alcohol use Yes     Comment: 2-3 drinks per day  . Drug use: No  . Sexual activity: Not on file   Other Topics Concern  . Not on file   Social History Narrative  . No narrative on file     Review of Systems    General:  No chills, fever, night sweats or weight changes.  Cardiovascular:  No chest pain, dyspnea on exertion, edema, orthopnea, palpitations, paroxysmal nocturnal dyspnea. Dermatological: No rash, lesions/masses Respiratory: No cough, dyspnea Urologic: No hematuria, dysuria Abdominal:   No nausea, vomiting, diarrhea, bright red blood per rectum, melena, or hematemesis Neurologic:  No visual changes, wkns, changes in mental status. All  other systems reviewed and are otherwise negative except as noted above.  Physical Exam    Blood pressure 97/74, pulse (!) 136, temperature 99.2 F (37.3 C), temperature source Rectal, resp. rate 16, SpO2 94 %.  General: Pleasant,lethargic NAD.  Psych: Normal affect. Neuro: Alert and oriented X 3. Moves all extremities spontaneously. HEENT: Missing many teeth.   Neck: Supple without bruits or JVD. Lungs:  Resp regular and unlabored, CTA. Heart: irregularly irregular rhythm. no s3, s4, or murmurs. Abdomen: Soft, non-tender, non-distended, BS + x 4.  Extremities: No clubbing, cyanosis or  edema. DP/PT/Radials 2+ and equal bilaterally.  Labs    Troponin [facility] Care Test)  Recent Labs  12/31/16 1915  TROPIPOC 0.02    Recent Labs  12/31/16 1850  CKTOTAL 80  TROPONINI 0.03*   Lab Results  Component Value Date   WBC 7.1 01/01/2017   HGB 15.4 01/01/2017   HCT 45.2 01/01/2017   MCV 99.8 01/01/2017   PLT 152 01/01/2017    Recent Labs Lab 12/31/16 1441  NA 134*  K 4.3  CL 97*  CO2 26  BUN 12  CREATININE 0.88  CALCIUM 9.3  PROT 6.8  BILITOT 1.3*  ALKPHOS 75  ALT 15*  AST 22  GLUCOSE 87     Radiology Studies    Dg Chest 2 View  Result Date: 12/31/2016 CLINICAL DATA:  Patient fell 5 days ago. For age trial fibrillation, atrial flutter and hypertension EXAM: CHEST  2 VIEW COMPARISON:  None. FINDINGS: Cardiomegaly with aortic atherosclerosis. No pneumonic consolidation. Bibasilar atelectasis and/or scarring left greater than right. No overt pulmonary edema or pneumothorax. Vascular calcifications are seen bilaterally along the axillary arteries. IMPRESSION: Cardiomegaly with aortic atherosclerosis. No acute pulmonary disease. Bibasilar atelectasis and/or scarring. Electronically Signed   By: Ashley Royalty M.D.   On: 12/31/2016 20:03   Dg Wrist Complete Left  Result Date: 12/31/2016 CLINICAL DATA:  Fall with pain.  Patient fell 5 days ago. EXAM: LEFT WRIST - COMPLETE 3+ VIEW COMPARISON:  None. FINDINGS: No fracture. No subluxation or dislocation. Degenerative changes are seen in the first carpometacarpal joint. IMPRESSION: 1. No acute findings. 2. Degenerative changes first carpometacarpal joint. Electronically Signed   By: Misty Stanley M.D.   On: 12/31/2016 20:01   Ct Head Wo Contrast  Result Date: 12/31/2016 CLINICAL DATA:  Fall 3 days ago.  Headache.  Initial encounter. EXAM: CT HEAD WITHOUT CONTRAST TECHNIQUE: Contiguous axial images were obtained from the base of the skull through the vertex without intravenous contrast. COMPARISON:  None. FINDINGS:  Brain: No evidence of acute infarction, hemorrhage, hydrocephalus, extra-axial collection or mass lesion/mass effect. Mild diffuse cerebral atrophy and chronic small vessel disease. Vascular: No hyperdense vessel or unexpected calcification. Skull: Normal. Negative for fracture or focal lesion. Sinuses/Orbits: Right mastoid effusion noted. Other: None. IMPRESSION: No acute intracranial abnormality. Mild cerebral atrophy and chronic small vessel disease. Right mastoid effusion. Electronically Signed   By: Earle Gell M.D.   On: 12/31/2016 20:36   Ct Soft Tissue Neck W Contrast  Addendum Date: 12/31/2016   ADDENDUM REPORT: 12/31/2016 21:45 ADDENDUM: Postobstructive RIGHT middle ear and mastoid effusions. Bone windows now submitted. Slight widening and irregularity of the RIGHT bony eustachian tube concerning for tumoral invasion. Electronically Signed   By: Elon Alas M.D.   On: 12/31/2016 21:45   Result Date: 12/31/2016 CLINICAL DATA:  Oral mass. Hypotensive, weakness. History of LEFT retroauricular cancer, prostate cancer. EXAM: CT NECK WITH CONTRAST TECHNIQUE: Multidetector CT imaging  of the neck was performed using the standard protocol following the bolus administration of intravenous contrast. CONTRAST:  75 cc  ISOVUE-300 IOPAMIDOL (ISOVUE-300) INJECTION 61% COMPARISON:  None. FINDINGS: Pharynx and larynx: Approximately 16 x 22 x 19 mm mass RIGHT nasopharyngeal/ upper palatine tonsillar mass effacing the RIGHT fossa of Rosenmller, possible invasion of the RIGHT cavernous sinus, incompletely assessed. Hypopharyngeal edema compatible with post radiation change narrowing the LEFT vallecula. Patent piriform sinuses. Asymmetric fullness LEFT palatine tonsil with LEFT parapharyngeal fat stranding. Amorphous LEFT pterygoid muscles. Salivary glands: Fat stranding bilateral submandibular space. Thyroid: Normal. Lymph nodes: Matted LEFT lateral pharyngeal lymph nodes. VASCULAR: Dense presumed hematoma RIGHT  internal carotid artery origin with occlusion of the RIGHT internal carotid artery to the included cavernous segment. Limited intracranial: Normal. Visualized orbits: Normal. Mastoids and visualized paranasal sinuses: Bilateral maxillary sinus mucosal retention cyst. RIGHT middle ear in mastoid effusion. Small LEFT mastoid effusion. Skeleton: Poor dentition with multiple dental caries, periapical lucencies with bony re- absorption of the LEFT greater than RIGHT alveolar ridge and LEFT angle of the mandible. Chronic deformity RIGHT club proximal clavicle. Multilevel severe degenerative changes cervical spine. Upper chest: Ascending aortic aneurysm at 4.2 cm with moderate calcific atherosclerosis. Other: Thickened platysma. IMPRESSION: Limited assessment due to lack of prior imaging. 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 Possible skullbase invasion would be better characterized on MRI neck with contrast. 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 **An incidental finding of potential clinical significance has been found. 4.2 cm ascending aortic aneurysm. Recommend annual imaging followup by CTA or MRA. This recommendation follows 2010 ACCF/AHA/AATS/ACR/ASA/SCA/SCAI/SIR/STS/SVM Guidelines for the Diagnosis and Management of Patients with Thoracic Aortic Disease. Circulation. 2010; [id number]** Acute findings discussed with and reconfirmed by PA CHRISTOPHER LAWYER on 12/31/2016 at 9:07 pm. Electronically Signed: By: Elon Alas M.D. On: 12/31/2016 21:09   Mr Jeri Cos [id number] Contrast  Result Date: 01/01/2017 CLINICAL DATA:  Initial evaluation for  transient left leg weakness, tremor. EXAM: MRI HEAD WITHOUT AND WITH CONTRAST TECHNIQUE: Multiplanar, multiecho pulse sequences of the brain and surrounding structures were obtained without and with intravenous contrast. CONTRAST:  15 cc of MultiHance. COMPARISON:  Prior CT from 12/31/2016. FINDINGS: Brain: Diffuse prominence of the CSF containing spaces is compatible with generalized cerebral atrophy. Patchy and confluent T2/FLAIR hyperintensity within the periventricular and deep white matter both cerebral hemispheres most compatible chronic small vessel ischemic disease, mild in nature. Mild chronic microvascular ischemic changes noted within the pons as well. No abnormal foci of restricted diffusion to suggest acute or subacute ischemia. Gray-white matter differentiation maintained. No evidence for chronic infarction. No evidence for acute or chronic intracranial hemorrhage. No mass lesion, midline shift, or mass effect. No hydrocephalus. No extra-axial fluid collection. Minimal serpiginous enhancement within the left occipital lobe favored to reflect a small DVA (series 23, image 14). No other abnormal enhancement. Pituitary gland and suprasellar region within normal limits. Vascular: Abnormal flow void within the right ICA and, compatible with previous identified right ICA occlusion. Major intracranial vascular flow voids otherwise maintained. Skull and upper cervical spine: Asymmetric fullness with enhancement at the right nasopharynx, compatible with previous identified nasopharyngeal mass. This measures approximately 15 x 27 mm on this exam (series 23, image  4). Finding concerning for possible recurrent or metastatic disease. There is asymmetric dural thickening with enhancement along the floor of the adjacent anteromedial left middle cranial fossa (series 24, image 21). Craniocervical junction within normal limits. Visualized upper cervical spine unremarkable. Bone marrow signal intensity normal. No scalp  soft tissue abnormality. Sinuses/Orbits: Globes and orbital soft tissues within normal limits. Scattered mucosal thickening within the maxillary sinuses and ethmoidal air cells. No air-fluid level to suggest active sinus infection. Right-sided mastoid effusion, likely postobstructive. Small left mastoid effusion noted as well. MRI NECK FINDINGS: Study moderately degraded by motion artifact. Previously identified right nasopharyngeal mass again seen. Overall, this lesion measures approximately 22 x 34 x 28 mm on this exam (AP by transverse by craniocaudad). Mass appears to extend into the petrous right temporal bone, and is intimately associated with the petrous right ICA (series 20, image 4). Subtle asymmetric dural thickening seen just superiorly at the floor of the left middle cranial fossa may reflect early skullbase invasion or possibly reactive changes (Series 22, image 16). Asymmetric enhancement within the right eustachian tube again concerning for possible tumoral invasion. Postobstructive right mastoid effusion noted. 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 Associated intimal enhancement, suggesting that this is acute in nature. Previously questioned left palatine tonsillar carcinoma is not clearly seen on this exam. The left tonsil itself is mildly prominent as compared to the right with relative partial effacement of the left parapharyngeal fat. However, no associated enhancement. 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 Mild asymmetric prominence with fullness within the adjacent right oropharyngeal mucosa. Inferiorly, remainder of the hypopharynx and supraglottic larynx unremarkable. True cords grossly normal. Subglottic airway clear. Changes related to probable osteo radio necrosis involving the mandible again  seen. Changes are worse within the right mandibular body as compared to the left. Suspected post radiation changes within the bilateral submandibular spaces, slightly greater on the left. No appreciable adenopathy identified within the neck. Thyroid gland is normal. Salivary glands including the parotid and submandibular glands within normal limits. Visualized upper mediastinum grossly unremarkable. Partially visualized lungs are grossly clear. Visualized osseous structures demonstrate no acute abnormality. Moderate multilevel degenerate spondylolysis noted within the visualized cervical spine, greatest at C6-7. IMPRESSION: MRI HEAD IMPRESSION: 1. No acute intracranial process identified. 2. Mild chronic microvascular ischemic disease. MRI NECK IMPRESSION: 1. 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 Mass involves the petrous aspect of the right temporal bone superiorly, and is intimately associated with the petrous right ICA. Subtle asymmetric dural thickening and enhancement within the adjacent left middle cranial fossa may reflect early skullbase/intracranial extension and/or reactive changes. 2. 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 3. Asymmetric prominence of the left palatine tonsil without associated enhancement, with asymmetric enhancement within the right palatine tonsil (see above discussion). Correlation with direct visualization recommended. 4. 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 Again, possible infection/osteomyelitis not excluded. Electronically Signed   By: Jeannine Boga M.D.   On: 01/01/2017 05:36   Mr Neck Soft Tissue Only W Wo Contrast  Result Date: 01/01/2017 CLINICAL DATA:  Initial evaluation for  transient left leg weakness, tremor. EXAM: MRI HEAD WITHOUT AND WITH CONTRAST TECHNIQUE: Multiplanar, multiecho pulse sequences of the brain and surrounding structures were obtained without and with intravenous contrast. CONTRAST:  15 cc of MultiHance. COMPARISON:  Prior CT from 12/31/2016. FINDINGS: Brain: Diffuse prominence of the CSF containing spaces is compatible with generalized cerebral atrophy. Patchy and confluent T2/FLAIR hyperintensity within the periventricular and deep white matter both cerebral hemispheres most compatible chronic small vessel ischemic disease, mild in nature. Mild chronic microvascular ischemic changes noted within the pons as well. No abnormal foci of restricted diffusion to suggest acute or subacute ischemia. Gray-white matter differentiation maintained. No evidence for chronic infarction. No evidence for acute or chronic intracranial hemorrhage. No mass lesion, midline shift, or mass effect. No hydrocephalus. No extra-axial fluid collection. Minimal serpiginous enhancement within the left occipital lobe favored to reflect a small DVA (series 23, image 14). No other abnormal enhancement. Pituitary gland and suprasellar region within normal limits. Vascular: Abnormal flow void within the right ICA and, compatible with previous identified right ICA occlusion. Major intracranial vascular flow voids otherwise maintained. Skull and upper cervical spine: Asymmetric fullness with enhancement at the right nasopharynx, compatible with previous identified nasopharyngeal mass. This measures approximately 15 x 27 mm on this exam (series 23, image 4). Finding concerning for possible recurrent or metastatic disease. There is asymmetric dural thickening with enhancement along the floor of the adjacent anteromedial left middle cranial fossa (series 24, image 21). Craniocervical junction within normal limits. Visualized upper cervical spine unremarkable. Bone marrow signal intensity normal. No scalp  soft tissue abnormality. Sinuses/Orbits: Globes and orbital soft tissues within normal limits. Scattered mucosal thickening within the maxillary sinuses and ethmoidal air cells. No air-fluid level to suggest active sinus infection. Right-sided mastoid effusion, likely postobstructive. Small left mastoid effusion noted as well. MRI NECK FINDINGS: Study moderately degraded by motion artifact. Previously identified right nasopharyngeal mass again seen. Overall, this lesion measures approximately 22 x 34 x 28 mm on this exam (AP by transverse by craniocaudad). Mass appears to extend into the petrous right temporal bone, and is intimately associated with the petrous right ICA (series 20, image 4). Subtle asymmetric dural thickening seen just superiorly at the floor of the left middle cranial fossa may reflect early skullbase invasion or possibly reactive changes (Series 22, image 16). Asymmetric enhancement within the right eustachian tube again concerning for possible tumoral invasion. Postobstructive right mastoid effusion noted. 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 Associated intimal enhancement, suggesting that this is acute in nature. Previously questioned left palatine tonsillar carcinoma is not clearly seen on this exam. The left tonsil itself is mildly prominent as compared to the right with relative partial effacement of the left parapharyngeal fat. However, no associated enhancement. 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 Mild asymmetric prominence with fullness within the adjacent right oropharyngeal mucosa. Inferiorly, remainder of the hypopharynx and supraglottic larynx unremarkable. True cords grossly normal. Subglottic airway clear. Changes related to probable osteo radio necrosis involving the mandible again  seen. Changes are worse within the right mandibular body as compared to the left. Suspected post radiation changes within the bilateral submandibular spaces, slightly greater on the left. No appreciable adenopathy identified within the neck. Thyroid gland is normal. Salivary glands including the parotid and submandibular glands within normal limits.  Visualized upper mediastinum grossly unremarkable. Partially visualized lungs are grossly clear. Visualized osseous structures demonstrate no acute abnormality. Moderate multilevel degenerate spondylolysis noted within the visualized cervical spine, greatest at C6-7. IMPRESSION: MRI HEAD IMPRESSION: 1. No acute intracranial process identified. 2. Mild chronic microvascular ischemic disease. MRI NECK IMPRESSION: 1. 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 Mass involves the petrous aspect of the right temporal bone superiorly, and is intimately associated with the petrous right ICA. Subtle asymmetric dural thickening and enhancement within the adjacent left middle cranial fossa may reflect early skullbase/intracranial extension and/or reactive changes. 2. 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 3. Asymmetric prominence of the left palatine tonsil without associated enhancement, with asymmetric enhancement within the right palatine tonsil (see above discussion). Correlation with direct visualization recommended. 4. 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 Again, possible infection/osteomyelitis not excluded. Electronically Signed   By: Jeannine Boga M.D.   On: 01/01/2017 05:36    EKG & Cardiac Imaging    EKG: Atrial flutter, rate 135.   Echocardiogram: pending.   Assessment &  Plan    1. Rapid atrial flutter: Patient started initially on diltiazem at 1900 last night but was stopped due to hypotension, however it looks like his BP was soft in the 100-120's. Started on an Amiodarone gtt. Still with rapid rates.   Will add Diltiazem gtt, no titration, just continuous rate at 5mg /hour.   This patients CHA2DS2-VASc Score and unadjusted Ischemic Stroke Rate (% per year) is equal to 0.6 % stroke rate/year from a score of 1 Above score calculated as 1 point each if present [CHF, HTN, DM, Vascular=MI/PAD/Aortic Plaque, Age if 65-74, or Male], 2 points each if present [Age > 75, or Stroke/TIA/TE]  Low CHA2DS2-VASc score of 1. No need for anticoagulation currently.   2. Leg tremor: EEG per primary team to rule out seizures.   3. Dehydration: Likely contributory to his rapid Aflutter. Getting hydration.   4. Postobstructive right middle ear and mastoid effusions: ENT consult.   Signed, Arbutus Leas, NP 01/01/2017, 10:18 AM Pager: [phone] Patient seen and examined and history reviewed. Agree with above findings and plan. 71 yo WM with history of palatine CA presents with recurrent falls. Found to be in atrial flutter with RVR rate 140. Patient is asymptomatic. History is limited due to patient's difficulty talking and currently getting an EEG. Patient reports to me that he had possibly an ablation procedure in the past while in [place]. He now also has a nasopharyngeal mass and occlusion of right ICA On exam he appears elderly and chronically ill. Missing dentition and gauze in his mouth. Lungs are clear. CV with tachy regular rhythm  Labs, Xrays, and Ecg reviewed  Impression: 1. Atrial flutter. RVR. Asymptomatic. Currently on IV amiodarone. Was on IV diltiazem but this was discontinued for soft BP. BP looks adequate to tolerate diltiazem. Will resume drip at 5 mg/hr (no bolus). Can titrate as BP allows. OK to continue as long as BP> 90. [place] vasc score of 1. I am  reluctant to recommend anticoagulation since we do not have clear definition of this nasopharyngeal mass/risk of bleeding. Will attempt to get records from [facility] concerning prior cardiac evaluation and treatment. Check Echo. 2. Nasopharyngeal mass. Possible recurrent/metastatic CA  3. Recurrent falls with leg tremors. EEG in progress. Neuro consult pending.    Peter Martinique, [place] 01/01/2017 10:45 AM

## 2017-01-01 NOTE — ED Notes (Signed)
Admitting at bedside 

## 2017-01-01 NOTE — Procedures (Signed)
ELECTROENCEPHALOGRAM REPORT  Date of Study: 01/01/2017  Patient's Name: Tyler Holland MRN: [id number] Date of Birth: Apr 04, 1946  Referring Provider: [name], MD  Clinical History: 71 year old gentleman history of head and neck cancer diagnosed in 2012 and treated in [place] with chemotherapy and radiation, history of prostate cancer, hypertension, atrial fibrillation/flutter presented to the ED after 2 falls at home with acute onset of uncontrollable left lower extremity tremor leading to his fall and significant weakness. In the ED patient noted to be in A. fib with RVR and borderline hypotension was initially on IV Cardizem and subsequently transitioned to IV amiodarone. 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  Medications:  Zofran, Toprol-XL, Norco, Cardizem, Klonopin, Liptior, amiodarane, TYlenol, Zestril  Technical Summary: A multichannel digital EEG recording measured by the international 10-20 system with electrodes applied with paste and impedances below 5000 ohms performed in our laboratory with EKG monitoring in an awake and drowsy patient.  Hyperventilation and photic stimulation were not performed.  The digital EEG was referentially recorded, reformatted, and digitally filtered in a variety of bipolar and referential montages for optimal display.    Description: The patient is awake and drowsy during the recording.  During maximal wakefulness, there is a symmetric, medium voltage 8 Hz posterior dominant rhythm that attenuates with eye opening.  The record is symmetric.  During drowsiness, there is an increase in theta slowing of the background.  Vertex waves and symmetric sleep spindles were not seen.  There were no epileptiform discharges or electrographic seizures seen.    EKG lead was unremarkable.  Impression: This awake and drowsy EEG is normal.    Clinical Correlation: A normal EEG does not exclude a clinical diagnosis of epilepsy.   If further clinical questions remain, prolonged EEG may be helpful.  Clinical correlation is advised.   Metta Clines, DO

## 2017-01-01 NOTE — ED Notes (Signed)
Occupational therapy at bedside.

## 2017-01-01 NOTE — ED Notes (Signed)
Cardiology at bedside.

## 2017-01-01 NOTE — ED Notes (Signed)
Computer issues in patient's room.  Secretary aware and she will be contacting IT.

## 2017-01-01 NOTE — ED Notes (Signed)
Patient transported to MRI 

## 2017-01-01 NOTE — ED Notes (Signed)
EEG called, will come do bedside EEG

## 2017-01-01 NOTE — ED Notes (Signed)
Heather from Dr. Arville Care, ENT office called this RN per Dr. [name] request (hospitalist). Dr. Constance Holster would like pt to follow-up outpatient in office after d/c from hospital, aware of pt's MRI results. Can contact w/ further questions at [phone]

## 2017-01-01 NOTE — Progress Notes (Signed)
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 Fish Allergy Swelling    Fresh water fish caused throat to swell (child)  . Penicillins Other (See Comments)    Unknown childhood allergic reaction    Patient Measurements:    Weight:  81.6 kg on 11/06/16  Vital Signs: BP: 110/86 (01/24 1230) Pulse Rate: 139 (01/24 1230)  Labs:  Recent Labs  12/31/16 1441 12/31/16 1850 01/01/17 0934  HGB 17.1*  --  15.4  HCT 50.1  --  45.2  PLT 174  --  152  CREATININE 0.88  --  0.68  CKTOTAL  --  80  --   TROPONINI  --  0.03* <0.03    CrCl cannot be calculated (Unknown ideal weight.).   Medical History: Past Medical History:  Diagnosis Date  . A-fib ([place])   . Atrial flutter ([place])   . Cancer ([place])    behind L ear  . Hypertension   . Prostate cancer ([place]) 2002  . Status post radiation 09/2011     Assessment:  71 yo M in ED.  Pharmacy consulted to dose heparin for TIA in the setting of acute RICA occlusion.  Pt also has atrial fibrilation, not on anticoagulants PTA.  D/w Dr. Shon Hale of neurology - use no bolus, low goal protocol. Wt  81.6 kg on 11/06/16. CBC WNL, creat WNL.   Goal of Therapy:  Heparin level 0.3 - 0.5 units/ml Monitor platelets by anticoagulation protocol: Yes    Plan:  No bolus per neuro protocol Start heparin drip at 1050 units/hr and check heparin level in 8 hrs Daily HL and CBC while on heparin  Eudelia Bunch, Pharm.D. [id number] 01/01/2017 1:27 PM

## 2017-01-01 NOTE — Consult Note (Signed)
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 History is obtained directly from the patient was a good historian.  The patient reports that on 12/28/16, he experienced 2 separate episodes where he had uncontrolled shaking of his left leg. Both of these occurred while he was walking around his apartment. He developed sudden jerking of his left leg significant enough that he fell to the ground. He denies any change in level of consciousness with these episodes. He states that he was unable to get up off of the ground afterwards, though says this is because he is weak all over. He is not specifically endorsing frank weakness in his left leg with these episodes. He has not had any further episodes since 12/28/16 and denies any similar episodes before.  Of note, he does report that for the past several months, he has had intermittent episodes of numbness in the left hand. These will last up to 2 hours and then resolve. He is not aware of any activities, movements, or positions that provoke this numbness. He states that he does not take aspirin or any other antiplatelet therapy. He is not on anticoagulation.  In the emergency department, he was noted to be in A. fib with RVR. He was started on a Cardizem drip but developed some hypotension. He was then transitioned to IV amiodarone. He had imaging of the head and neck including CT and MRI. These revealed no evidence of acute ischemia within the brain. However, he has occlusion of the right internal carotid artery which was felt to be acute. He was also noted to have a mass at the right skull base concerning for metastatic disease. EEG was ordered by the admitting physician and was  unremarkable.  Neurology consultation is now requested for further recommendations regarding these episodes of shaking in his left leg.  PMH:  Past Medical History:  Diagnosis Date  . A-fib ([place])   . Atrial flutter ([place])   . Cancer ([place])    behind L ear  . Hypertension   . Prostate cancer ([place]) 2002  . Status post radiation 09/2011    PSH:  Past Surgical History:  Procedure Laterality Date  . PROSTATECTOMY  2002    Family history: History reviewed. No pertinent family history.  Social history:  Social History   Social History  . Marital status: Single    Spouse name: N/A  . Number of children: 0  . Years of education: N/A   Occupational History  . Not on file.   Social History Main Topics  . Smoking status: Current Some Day Smoker  . Smokeless tobacco: Never Used  . Alcohol use Yes     Comment: 2-3 drinks per day  . Drug use: No  . Sexual activity: Not on file   Other Topics Concern  . Not on file   Social History Narrative  . No narrative on file    Current outpatient meds: Current Meds  Medication Sig  . atorvastatin (LIPITOR) 20 MG tablet Take 1 tablet (20 mg total) by mouth daily.  . clonazePAM (KLONOPIN) 0.5 MG tablet Take 0.5 mg by mouth 2 (two) times daily as needed for anxiety.  Marland Kitchen HYDROcodone-acetaminophen (NORCO) 7.5-325 MG tablet Take  0.5-1 tablets by mouth every 6 (six) hours as needed for pain.  Marland Kitchen lisinopril (ZESTRIL) 2.5 MG tablet Take 1 tablet (2.5 mg total) by mouth daily.  . metoprolol succinate (TOPROL-XL) 25 MG 24 hr tablet Take 0.5 tablets (12.5 mg total) by mouth daily.    Current inpatient meds:  Current Facility-Administered Medications  Medication Dose Route Frequency Provider Last Rate Last Dose  . 0.9 %  sodium chloride infusion   Intravenous Continuous [name], MD 125 mL/hr at 01/01/17 [phone]    . acetaminophen (TYLENOL) tablet 650 mg  650 mg Oral Q4H PRN [name], MD       Or  . acetaminophen (TYLENOL) solution  650 mg  650 mg Per Tube Q4H PRN [name], MD       Or  . acetaminophen (TYLENOL) suppository 650 mg  650 mg Rectal Q4H PRN [name], MD      . amiodarone (NEXTERONE PREMIX) 360-4.14 MG/200ML-% (1.8 mg/mL) IV infusion  30 mg/hr Intravenous Continuous [name], MD 16.7 mL/hr at 01/01/17 0948 30 mg/hr at 01/01/17 0948  . atorvastatin (LIPITOR) tablet 20 mg  20 mg Oral Daily [name], MD      . clonazePAM [facility]) tablet 0.5 mg  0.5 mg Oral BID PRN [name], MD      . diltiazem (CARDIZEM) 100 mg in dextrose 5 % 100 mL (1 mg/mL) infusion  5 mg/hr Intravenous Continuous Arbutus Leas, NP   Stopped at 01/01/17 1114  . enoxaparin (LOVENOX) injection 40 mg  40 mg Subcutaneous Q24H [name], MD      . HYDROcodone-acetaminophen [facility]) 7.5-325 MG per tablet 0.5-1 tablet  0.5-1 tablet Oral Q6H PRN [name], MD      . lidocaine (XYLOCAINE) 2 % viscous mouth solution 15 mL  15 mL Mouth/Throat Q3H PRN [name], MD      . metoprolol succinate (TOPROL-XL) 24 hr tablet 12.5 mg  12.5 mg Oral Daily [name], MD   12.5 mg at 01/01/17 0559  . ondansetron (ZOFRAN) tablet 4 mg  4 mg Oral Q6H PRN [name], MD       Or  . ondansetron [facility]) injection 4 mg  4 mg Intravenous Q6H PRN [name], MD       Current Outpatient Prescriptions  Medication Sig Dispense Refill  . atorvastatin (LIPITOR) 20 MG tablet Take 1 tablet (20 mg total) by mouth daily. 90 tablet 0  . clonazePAM (KLONOPIN) 0.5 MG tablet Take 0.5 mg by mouth 2 (two) times daily as needed for anxiety.    Marland Kitchen HYDROcodone-acetaminophen (NORCO) 7.5-325 MG tablet Take 0.5-1 tablets by mouth every 6 (six) hours as needed for pain.    Marland Kitchen lisinopril (ZESTRIL) 2.5 MG tablet Take 1 tablet (2.5 mg total) by mouth daily. 90 tablet 0  . metoprolol succinate (TOPROL-XL) 25 MG 24 hr tablet Take 0.5 tablets (12.5 mg total) by mouth daily. 45 tablet 0    Allergies: Allergies  Allergen Reactions  . Fish Allergy Swelling     Fresh water fish caused throat to swell (child)  . Penicillins Other (See Comments)    Unknown childhood allergic reaction    ROS: As per HPI. A full 14-point review of systems was performed and is otherwise notable for some discomfort in his mouth which is chronic. He states that he usually takes Vicodin at home for this pain.  PE:  BP 105/85   Pulse (!) 132   Temp 99.2 F (37.3 C) (  Rectal)   Resp 18   SpO2 96%   General: WD Caucasian man lying in the gurney. He complains of mild discomfort in his mouth and wants to Have some pain medication. He is otherwise in no acute distress. AAO x4. Speech is moderately dysarthric. No aphasia. Follows commands briskly. Affect is restricted. Comportment is normal.  HEENT: Normocephalic. Neck supple without LAD. MMM, OP clear. Dentition good. Sclerae anicteric. No conjunctival injection.  CV: Tachycardia, irregularly irregular, no murmur. No carotid bruit. Distal pulses 2+ and symmetric.  Lungs: CTAB.  Abdomen: Soft, non-distended, non-tender. Bowel sounds present x4.  Extremities: No C/C/E. Neuro:  CN: Pupils are equal and round. They are symmetrically reactive from 3-->2 mm. visual fields are full to confrontation. EOMI without nystagmus. There is some breakup of smooth pursuits. No reported diplopia. Facial sensation is intact to light touch. Face is symmetric at rest with normal strength and mobility. Hearing is intact to conversational voice. Palate elevates symmetrically and uvula is midline. Voice is normal in tone, pitch and quality. Bilateral SCM and trapezii are 5/5. Tongue is midline with normal bulk and mobility.  Motor: Normal bulk, tone, and strength with the exception of 4+/5 ankle plantar flexion on the left.. No tremor or other abnormal movements. No drift.  Sensation: Decreased to all modalities and a patchy manner over the left lower extremity. In addition, he has a stocking distribution loss of vibration bilaterally to just below the  knee.  DTRs: 2+, symmetric with absent ankle jerks bilaterally. Toes downgoing bilaterally. No pathologic reflexes.  Coordination: Finger-to-nose and heel-to-shin are without dysmetria. Finger taps are normal in amplitude and speed, no decrement.    Labs:  Lab Results  Component Value Date   WBC 7.1 01/01/2017   HGB 15.4 01/01/2017   HCT 45.2 01/01/2017   PLT 152 01/01/2017   GLUCOSE 156 (H) 01/01/2017   ALT 15 (L) 01/01/2017   AST 21 01/01/2017   NA 133 (L) 01/01/2017   K 3.5 01/01/2017   CL 101 01/01/2017   CREATININE 0.68 01/01/2017   BUN 10 01/01/2017   CO2 23 01/01/2017   TSH 2.810 01/01/2017   LFTs notable for albumin 2.7 Troponin 0.03--0.02--<0.03 Magnesium 1.3 TSH 2.810 Free T4 1.00 Urinalysis notable for specific gravity 1.045, ketones 20 mg/dL Lactic acid 1.76 CK 80  Imaging:  I have personally and independently reviewed MRI scan of the brain with and without contrast from 01/01/17. There is no restricted diffusion to suggest acute ischemic change. He has a moderate burden of chronic small vessel ischemic disease involving the bihemispheric white matter. Diffuse generalized atrophy is noted. The flow void in the right internal carotid artery is abnormal, suggesting reduced flow versus occlusion of this vessel. Cerebral artery flow voids appear to be normal. Of note is an area of enhancement in the right nasopharynx with some enhancement of the dura along the left middle cranial fossa, concerning for metastatic disease.  Other diagnostic studies:  TTE pending  Awake and drowsy EEG from 01/01/17 is normal.  Assessment and Plan:  1. Probable TIA: History is reported as concerning for limb shaking TIA. These have been associated with high-grade carotid lesions and carotid stenosis. In this case, his left lower extremity shaking would be consistent with occlusion of the right internal carotid artery. Imaging suggested acute occlusion of the right internal carotid artery,  making this less likely to be related to his previous radiation therapy. He will need dedicated vascular imaging to further characterize the extent of  this lesion. This will help guide management moving forward. Additional risk factors for TIA include atrial fibrillation, hypertension, age, and history of smoking. The patient indicates that he does not take any antiplatelet therapy or anticoagulation at baseline. While evaluation is underway, I would favor anti-coagulation with heparin drip given recurrent TIA in the setting of his carotid occlusion. Anticoagulation would also be reasonable given his underlying atrial fibrillation. If he is unable to be anticoagulated, would recommend aspirin for secondary stroke prevention. Transthoracic echocardiogram has been ordered and is pending at this time. I will add hemoglobin A1c and fasting lipids to complete his evaluation. Ensure tight control of glucose. Avoid hypotension in this setting to optimize blood flow in the right internal carotid territory.  2. Left lower extremity shaking: As noted, this is most suggestive of a limb shaking TIA in the setting of carotid occlusion. EEG was unremarkable. No role for antiepileptic medication at this time.  3. Intermittent left hand numbness: The patient reports he has had episodic numbness in the left hand for the past several months. These certainly could represent TIAs as well in the setting of his right internal carotid artery occlusion and atrial fibrillation. Management is as noted above.  4. Right internal carotid artery occlusion: We will need to determine if this artery is completely occluded or whether there is any flow which may be amenable to intervention. I will order MRA of the neck for further evaluation. In the meantime, I would favor anti-correlation with heparin drip as this appears to be symptomatic due to recurrent TIAs.  5. 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 We'll defer further management to primary attending.  This was discussed with the patient. He is in agreement with the plan as noted. He was given opportunity to ask questions and these were addressed to his satisfaction.  Thank you for this consultation. The stroke team will assume care of the patient being in 01/02/17. Please call with any additional questions or concerns.

## 2017-01-01 NOTE — ED Notes (Signed)
Tyler Holland, Phlebotomy, will be coming soon to draw labs.

## 2017-01-01 NOTE — Progress Notes (Signed)
Echocardiogram 2D Echocardiogram has been performed.  Tyler Holland 01/01/2017, 3:17 PM

## 2017-01-01 NOTE — Evaluation (Signed)
Occupational Therapy Evaluation Patient Details Name: Tyler Holland MRN: [id number] DOB: September 17, 1946 Today's Date: 01/01/2017    History of Present Illness  Tyler Holland is a 71 y.o. gentleman with a history of head and neck cancer (diagnosed in 2012), prostate cancer, HTN, and atrial fibrillation/flutter presented to ED wtih Left leg "quivering", weakness, recurrent falls x2. 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 Mass involves the petrous aspect of the right temporal bone superiorly,and is intimately associated with the petrous right ICA. Subtle asymmetric dural thickening and enhancement within the adjacent left middle cranial fossa may reflect early skullbase/intracranial extension and/or reactive changes. 2. 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   Clinical Impression   This 71 yo male admitted with above presents to acute OT with deficits below (see OT problem list) thus affecting his PLOF of Mod I with SPC per his report for mobility and basic ADLs as well as doing most of his IADLs. He will benefit from acute OT with follow up OT at SNF to work back towards PLOF.    Follow Up Recommendations  SNF    Equipment Recommendations  3 in 1 bedside commode;Tub/shower seat       Precautions / Restrictions Precautions Precautions: Fall Restrictions Weight Bearing Restrictions: No      Mobility Bed Mobility Overal bed mobility: Needs Assistance Bed Mobility: Rolling;Sidelying to Sit Rolling: Min assist Sidelying to sit: Min assist          Transfers Overall transfer level: Needs assistance Equipment used: Straight cane Transfers: Sit to/from Stand Sit to Stand: Min assist              Balance Overall balance assessment: Needs  assistance Sitting-balance support: No upper extremity supported;Feet supported Sitting balance-Leahy Scale: Good     Standing balance support: Single extremity supported Standing balance-Leahy Scale: Fair                              ADL Overall ADL's : Needs assistance/impaired Eating/Feeding: Independent;Sitting   Grooming: Set up;Supervision/safety;Sitting   Upper Body Bathing: Supervision/ safety;Set up;Sitting   Lower Body Bathing: Minimal assistance;Sit to/from stand   Upper Body Dressing : Supervision/safety;Set up;Sitting   Lower Body Dressing: Minimal assistance;Sit to/from stand   Toilet Transfer: Minimal assistance;Stand-pivot   Toileting- Clothing Manipulation and Hygiene: Minimal assistance;Sit to/from stand                         Pertinent Vitals/Pain Pain Assessment: Faces Faces Pain Scale: Hurts little more Pain Location: low back Pain Descriptors / Indicators: Aching;Sore Pain Intervention(s): Limited activity within patient's tolerance;Monitored during session;Repositioned     Hand Dominance Right   Extremity/Trunk Assessment Upper Extremity Assessment Upper Extremity Assessment: Overall WFL for tasks assessed           Communication Communication Communication: HOH   Cognition Arousal/Alertness: Awake/alert Behavior During Therapy: WFL for tasks assessed/performed Overall Cognitive Status: Within Functional Limits for tasks assessed                 General Comments: however pt did stay at home in pretty much in bed for 2 days prior to admission because he did not have anyone to bring him to the hosptial per chart  Home Living Family/patient expects to be discharged to:: Skilled nursing facility Living Arrangements: Alone   Type of Home: Apartment Home Access: Stairs to enter Entrance Stairs-Number of Steps: 7 Entrance Stairs-Rails: Right;Left;Can reach both Home Layout: One level      Bathroom Shower/Tub:  (takes tub baths for fear of falling in tub)   Bathroom Toilet: Standard     Home Equipment: Cane - single point          Prior Functioning/Environment Level of Independence: Independent                 OT Problem List: Decreased strength;Impaired balance (sitting and/or standing)   OT Treatment/Interventions: Self-care/ADL training;Balance training;DME and/or AE instruction;Patient/family education    OT Goals(Current goals can be found in the care plan section) Acute Rehab OT Goals Patient Stated Goal: worried about getting up steps at home OT Goal Formulation: With patient Time For Goal Achievement: 01/15/17 Potential to Achieve Goals: Good  OT Frequency: Min 2X/week   Barriers to D/C: Decreased caregiver support             End of Session Equipment Utilized During Treatment: Gait belt [facility]) Nurse Communication: Mobility status  Activity Tolerance: Patient tolerated treatment well Patient left: in bed;with call bell/phone within reach;with bed alarm set   Time: 1221-1258 OT Time Calculation (min): 37 min Charges:  OT General Charges $OT Visit: 1 Procedure OT Evaluation $OT Eval Moderate Complexity: 1 Procedure OT Treatments $Self Care/Home Management : 8-22 mins G-Codes: OT G-codes **NOT FOR INPATIENT CLASS** Functional Assessment Tool Used: clinical observation Functional Limitation: Self care Self Care Current Status [id number]): At least 20 percent but less than 40 percent impaired, limited or restricted Self Care Goal Status [id number]): At least 1 percent but less than 20 percent impaired, limited or restricted  Almon Register [id number] 01/01/2017, 2:14 PM

## 2017-01-01 NOTE — ED Provider Notes (Signed)
[facility] DEPT Provider Note   CSN: [id number] Arrival date & time: 12/31/16  1337     History   Chief Complaint Chief Complaint  Patient presents with  . Fall    The patient said he fell on Saturday twice.  He did not have a way to get to the doctors so he waited for his neice to bring him.  The patient said he tried to catch himself with his left hand and injured his wrist.  He rates his pain 2/10.    . Diarrhea    HPI Tyler Holland is a 71 y.o. male.  HPI Patient presents to the emergency department following a fall that occurred on Saturday.  The patient states he fell twice on Saturday.  He says during these episodes and his left leg started shaking and states that he fell but did not lose consciousness or hit his head.  He states he does have some pain to his left wrist.  The patient states that he laid in bed from Saturday until today.  He states that he called his niece to bring him to the hospital.  Patient initially went to urgent care and was sent to the emergency department due to low blood pressure, tachycardia.  She tells me that he saw someone 2 weeks ago.  He states that his blood pressure was low.  The patient states that he did not have any other injuries in the fall.  Patient states he has not been eating or drinking much since the fall due to the fact that he did not get out of bed. The patient denies chest pain, shortness of breath, headache,blurred vision, neck pain, fever, cough, weakness, numbness, dizziness, anorexia, edema, abdominal pain, nausea, vomiting, diarrhea, rash, back pain, dysuria, hematemesis, bloody stool, near syncope, or syncope. Past Medical History:  Diagnosis Date  . A-fib ([place])   . Atrial flutter ([place])   . 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 Hypertension   . Prostate cancer ([place]) 2002  . Status post radiation 09/2011    Patient Active Problem List   Diagnosis Date Noted  . Dehydration 12/31/2016  . Otorrhea 11/28/2016    Past  Surgical History:  Procedure Laterality Date  . PROSTATECTOMY  2002       Home Medications    Prior to Admission medications   Medication Sig Start Date End Date Taking? Authorizing Provider  atorvastatin (LIPITOR) 20 MG tablet Take 1 tablet (20 mg total) by mouth daily. 11/06/16 02/04/17 Yes Barry Dienes, NP  clonazePAM (KLONOPIN) 0.5 MG tablet Take 0.5 mg by mouth 2 (two) times daily as needed for anxiety.   Yes Historical Provider, MD  HYDROcodone-acetaminophen (NORCO) 7.5-325 MG tablet Take 0.5-1 tablets by mouth every 6 (six) hours as needed for pain. 11/13/16  Yes Historical Provider, MD  lisinopril (ZESTRIL) 2.5 MG tablet Take 1 tablet (2.5 mg total) by mouth daily. 11/06/16 02/04/17 Yes Barry Dienes, NP  metoprolol succinate (TOPROL-XL) 25 MG 24 hr tablet Take 0.5 tablets (12.5 mg total) by mouth daily. 11/06/16 02/04/17 Yes Barry Dienes, NP    Family History History reviewed. No pertinent family history.  Social History Social History  Substance Use Topics  . Smoking status: Current Some Day Smoker  . Smokeless tobacco: Never Used  . Alcohol use Yes     Comment: 2-3 drinks per day     Allergies   Fish allergy and Penicillins   Review of Systems Review of Systems All other systems  negative except as documented in the HPI. All pertinent positives and negatives as reviewed in the HPI.  Physical Exam Updated Vital Signs BP 111/86   Pulse (!) 144   Temp 99.2 F (37.3 C) (Rectal)   Resp 14   SpO2 98%   Physical Exam  Constitutional: He is oriented to person, place, and time. He appears well-developed and well-nourished. No distress.  HENT:  Head: Normocephalic and atraumatic.  Mouth/Throat:    Eyes: Pupils are equal, round, and reactive to light.  Neck: Normal range of motion. Neck supple.  Cardiovascular: Normal rate, regular rhythm and normal heart sounds.  Exam reveals no gallop and no friction rub.   No murmur heard. Pulmonary/Chest: Effort normal and breath  sounds normal. No respiratory distress. He has no wheezes.  Abdominal: Soft. Bowel sounds are normal. He exhibits no distension. There is no tenderness.  Lymphadenopathy:       Head (right side): Submandibular adenopathy present.       Head (left side): Submandibular adenopathy present.    He has cervical adenopathy.       Right cervical: Superficial cervical adenopathy present.       Left cervical: Superficial cervical adenopathy present.  Neurological: He is alert and oriented to person, place, and time. He exhibits normal muscle tone. Coordination normal.  Skin: Skin is warm and dry. Capillary refill takes less than 2 seconds. No rash noted. No erythema.  Psychiatric: He has a normal mood and affect. His behavior is normal.  Nursing note and vitals reviewed.    ED Treatments / Results  Labs (all labs ordered are listed, but only abnormal results are displayed) Labs Reviewed  COMPREHENSIVE METABOLIC PANEL - Abnormal; Notable for the following:       Result Value   Sodium 134 (*)    Chloride 97 (*)    Albumin 3.2 (*)    ALT 15 (*)    Total Bilirubin 1.3 (*)    All other components within normal limits  CBC - Abnormal; Notable for the following:    Hemoglobin 17.1 (*)    MCV 100.2 (*)    MCH 34.2 (*)    All other components within normal limits  TROPONIN I - Abnormal; Notable for the following:    Troponin I 0.03 (*)    All other components within normal limits  LIPASE, BLOOD  CK  URINALYSIS, ROUTINE W REFLEX MICROSCOPIC  I-STAT CG4 LACTIC ACID, ED  I-STAT TROPOININ, ED    EKG  EKG Interpretation  Date/Time:  Tuesday December 31 2016 18:44:03 EST Ventricular Rate:  144 PR Interval:    QRS Duration: 100 QT Interval:  355 QTC Calculation: 550 R Axis:   66 Text Interpretation:  rapid afib  Anteroseptal infarct, age indeterminate Prolonged QT interval Baseline wander in lead(s) V1 V2 Confirmed by YAO  MD, DAVID ([id number]) on 12/31/2016 7:20:35 PM       Radiology Dg  Chest 2 View  Result Date: 12/31/2016 CLINICAL DATA:  Patient fell 5 days ago. For age trial fibrillation, atrial flutter and hypertension EXAM: CHEST  2 VIEW COMPARISON:  None. FINDINGS: Cardiomegaly with aortic atherosclerosis. No pneumonic consolidation. Bibasilar atelectasis and/or scarring left greater than right. No overt pulmonary edema or pneumothorax. Vascular calcifications are seen bilaterally along the axillary arteries. IMPRESSION: Cardiomegaly with aortic atherosclerosis. No acute pulmonary disease. Bibasilar atelectasis and/or scarring. Electronically Signed   By: Ashley Royalty M.D.   On: 12/31/2016 20:03   Dg Wrist Complete Left  Result  Date: 12/31/2016 CLINICAL DATA:  Fall with pain.  Patient fell 5 days ago. EXAM: LEFT WRIST - COMPLETE 3+ VIEW COMPARISON:  None. FINDINGS: No fracture. No subluxation or dislocation. Degenerative changes are seen in the first carpometacarpal joint. IMPRESSION: 1. No acute findings. 2. Degenerative changes first carpometacarpal joint. Electronically Signed   By: Misty Stanley M.D.   On: 12/31/2016 20:01   Ct Head Wo Contrast  Result Date: 12/31/2016 CLINICAL DATA:  Fall 3 days ago.  Headache.  Initial encounter. EXAM: CT HEAD WITHOUT CONTRAST TECHNIQUE: Contiguous axial images were obtained from the base of the skull through the vertex without intravenous contrast. COMPARISON:  None. FINDINGS: Brain: No evidence of acute infarction, hemorrhage, hydrocephalus, extra-axial collection or mass lesion/mass effect. Mild diffuse cerebral atrophy and chronic small vessel disease. Vascular: No hyperdense vessel or unexpected calcification. Skull: Normal. Negative for fracture or focal lesion. Sinuses/Orbits: Right mastoid effusion noted. Other: None. IMPRESSION: No acute intracranial abnormality. Mild cerebral atrophy and chronic small vessel disease. Right mastoid effusion. Electronically Signed   By: Earle Gell M.D.   On: 12/31/2016 20:36   Ct Soft Tissue Neck W  Contrast  Addendum Date: 12/31/2016   ADDENDUM REPORT: 12/31/2016 21:45 ADDENDUM: Postobstructive RIGHT middle ear and mastoid effusions. Bone windows now submitted. Slight widening and irregularity of the RIGHT bony eustachian tube concerning for tumoral invasion. Electronically Signed   By: Elon Alas M.D.   On: 12/31/2016 21:45   Result Date: 12/31/2016 CLINICAL DATA:  Oral mass. Hypotensive, weakness. History of LEFT retroauricular cancer, prostate cancer. EXAM: CT NECK WITH CONTRAST TECHNIQUE: Multidetector CT imaging of the neck was performed using the standard protocol following the bolus administration of intravenous contrast. CONTRAST:  75 cc  ISOVUE-300 IOPAMIDOL (ISOVUE-300) INJECTION 61% COMPARISON:  None. FINDINGS: Pharynx and larynx: Approximately 16 x 22 x 19 mm mass RIGHT nasopharyngeal/ upper palatine tonsillar mass effacing the RIGHT fossa of Rosenmller, possible invasion of the RIGHT cavernous sinus, incompletely assessed. Hypopharyngeal edema compatible with post radiation change narrowing the LEFT vallecula. Patent piriform sinuses. Asymmetric fullness LEFT palatine tonsil with LEFT parapharyngeal fat stranding. Amorphous LEFT pterygoid muscles. Salivary glands: Fat stranding bilateral submandibular space. Thyroid: Normal. Lymph nodes: Matted LEFT lateral pharyngeal lymph nodes. VASCULAR: Dense presumed hematoma RIGHT internal carotid artery origin with occlusion of the RIGHT internal carotid artery to the included cavernous segment. Limited intracranial: Normal. Visualized orbits: Normal. Mastoids and visualized paranasal sinuses: Bilateral maxillary sinus mucosal retention cyst. RIGHT middle ear in mastoid effusion. Small LEFT mastoid effusion. Skeleton: Poor dentition with multiple dental caries, periapical lucencies with bony re- absorption of the LEFT greater than RIGHT alveolar ridge and LEFT angle of the mandible. Chronic deformity RIGHT club proximal clavicle. Multilevel  severe degenerative changes cervical spine. Upper chest: Ascending aortic aneurysm at 4.2 cm with moderate calcific atherosclerosis. Other: Thickened platysma. IMPRESSION: Limited assessment due to lack of prior imaging. 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 Possible skullbase invasion would be better characterized on MRI neck with contrast. 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 **An incidental finding of potential clinical significance has been found. 4.2 cm ascending aortic aneurysm. Recommend annual imaging followup by CTA or MRA. This recommendation follows 2010 ACCF/AHA/AATS/ACR/ASA/SCA/SCAI/SIR/STS/SVM Guidelines for the Diagnosis  and Management of Patients with Thoracic Aortic Disease. Circulation. 2010; [id number]** Acute findings discussed with and reconfirmed by PA Latosha Gaylord on 12/31/2016 at 9:07 pm. Electronically Signed: By: Elon Alas M.D. On: 12/31/2016 21:09    Procedures Procedures (including critical care time)  Medications Ordered in ED Medications  0.9 %  sodium chloride infusion (not administered)  sodium chloride 0.9 % bolus 1,000 mL (0 mLs Intravenous Stopped 12/31/16 2057)  diltiazem (CARDIZEM) injection 10 mg (10 mg Intravenous Given 12/31/16 1921)  iopamidol (ISOVUE-300) 61 % injection (75 mLs  Contrast Given 12/31/16 2007)  diltiazem (CARDIZEM) 100 mg in dextrose 5 % 100 mL (1 mg/mL) infusion (10 mg/hr Intravenous Rate/Dose Change 12/31/16 2346)  sodium chloride 0.9 % bolus 1,000 mL (1,000 mLs Intravenous New Bag/Given 01/01/17 0015)     Initial Impression / Assessment and Plan / ED Course    I have reviewed the triage vital signs and the nursing notes.  Pertinent labs & imaging results that were available during my care of the patient were reviewed by me and considered in my medical decision making (see chart for details).     CRITICAL CARE Performed by: Brent General Total critical care time:40 minutes Critical care time was exclusive of separately billable procedures and treating other patients. Critical care was necessary to treat or prevent imminent or life-threatening deterioration. Critical care was time spent personally by me on the following activities: development of treatment plan with patient and/or surrogate as well as nursing, discussions with consultants, evaluation of patient's response to treatment, examination of patient, obtaining history from patient or surrogate, ordering and performing treatments and interventions, ordering and review of laboratory studies, ordering and review of radiographic studies, pulse oximetry and re-evaluation of patient's condition.  Patient was given Cardizem bolus along with started on a Cardizem drip.  The drip had to be titrated multiple times during his ER visit.  I also spoke with the [facility] hospitalist to admit the patient also spoke with Dr. 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

## 2017-01-01 NOTE — Progress Notes (Signed)
PROGRESS NOTE    Korbin Leischner  [id number] DOB: Jul 03, 1946 DOA: 12/31/2016 PCP: No PCP Per Patient    Brief Narrative:  Patient is a 71 year old gentleman history of head and neck cancer diagnosed in 2012 and treated in [place] with chemotherapy and radiation, history of prostate cancer, hypertension, atrial fibrillation/flutter presented to the ED after 2 falls at home with acute onset of uncontrollable left lower extremity tremor leading to his fall and significant weakness. In the ED patient noted to be in A. fib with RVR and borderline hypotension was initially on IV Cardizem and subsequently transitioned to IV amiodarone. 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   Assessment & Plan:   Principal Problem:   Transient left leg weakness Active Problems:   Dehydration   Atrial fibrillation with RVR (HCC)   Nasopharyngeal mass   ICAO (internal carotid artery occlusion), right   Ascending aortic aneurysm (HCC)   #1 transient left leg tremors/weakness Questionable etiology. Concern for seizures. 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 Also noted was complete occlusion of the right ICA from the bifurcation of the circle of Willis possibly due to tomorrow related to prior therapy. Also concerning was also radionecrosis involving the mandible. Will get a EEG to rule out seizures as nasopharyngeal mass might be a focus for seizure activity. PT/OT. Consult with neurology for further evaluation and management.  #2 A. fib with RVR versus SVT( CHA2DS2VASC score 3) Patient noted on admission to be in A. fib with RVR and initially placed on a Cardizem drip which was discontinued secondary to hypotension and patient currently on amiodarone drip. Blood pressure is borderline with systolics in the  0000000. Patient currently on amiodarone drip. TSH within normal limits. Free T4 1. We'll cycle cardiac enzymes every 6 hours 3. Check a 2-D echo. Consult with cardiology for further evaluation and management.  #3 dehydration Increase IV fluid rate to normal saline at 1 25 mL per hour. Follow.  #4 postobstructive right middle ear and mastoid effusions Consult with ENT.  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 Concern for recurrent tumor versus metastatic disease. Patient with a prior history of head and neck cancer involving the left side of his head behind his ear approximately 5 years ago, status post chemoradiation in [place], [place]. Consult with ENT for further evaluation and management.  #6 acute complete occlusion of right ICA MRI of head and neck showing a acute appearing complete occlusion of the right ICA from bifurcation to the circle of Willis likely secondary to tumor versus related therapy. Consult with neurology.  #7 hypotension Likely secondary to hypovolemic hypotension versus cardiac etiology vs infetious etiology (unlikely as patient with no fever and normal white count) cycle cardiac enzymes every 6 hours 3. Repeat EKG. Urinalysis nitrite negative leukocytes negative. Chest x-ray is negative for any acute infiltrate. Will check a random cortisol level. Place on IV fluids. Follow.     DVT prophylaxis: Lovenox Code Status: DNR Family Communication: updated patient, no family present. Disposition Plan: Patient awaiting stepdown bed in the ED.Home vs SNF pending workup.   Consultants:   Cardiology pending  Procedures:   CT head 12/31/2016  CT soft tissue neck 12/31/2016  MRI head 01/01/2017  MRI neck 01/01/2017  Chest x-ray 12/31/2016  Plain films of the left  wrist 12/31/2016  Antimicrobials:   None   Subjective: Patient denies CP. No SOB. Patient denies any pain in his left leg. Patient stated usually has left leg tremors prior to  fall.  Objective: Vitals:   01/01/17 0830 01/01/17 0845 01/01/17 0945 01/01/17 0949  BP: 112/95 103/88 90/70 97/74   Pulse:  (!) 131 (!) 135 (!) 136  Resp: 19 19 20 16   Temp:      TempSrc:      SpO2: 95% 94% 93% 94%    Intake/Output Summary (Last 24 hours) at 01/01/17 0951 Last data filed at 01/01/17 0945  Gross per 24 hour  Intake          2543.42 ml  Output              350 ml  Net          2193.42 ml   There were no vitals filed for this visit.  Examination:  General exam: Appears calm and comfortable  Respiratory system: Clear to auscultation. Respiratory effort normal. Cardiovascular system: Tachycardia. No JVD, murmurs, rubs, gallops or clicks. No pedal edema. Gastrointestinal system: Abdomen is nondistended, soft and nontender. No organomegaly or masses felt. Normal bowel sounds heard. Central nervous system: Alert and oriented. No focal neurological deficits. Extremities: Symmetric 5 x 5 power. Skin: No rashes, lesions or ulcers Psychiatry: Judgement and insight appear normal. Mood & affect appropriate.     Data Reviewed: I have personally reviewed following labs and imaging studies  CBC:  Recent Labs Lab 12/31/16 1441  WBC 8.6  HGB 17.1*  HCT 50.1  MCV 100.2*  PLT [id number]   Basic Metabolic Panel:  Recent Labs Lab 12/31/16 1441  NA 134*  K 4.3  CL 97*  CO2 26  GLUCOSE 87  BUN 12  CREATININE 0.88  CALCIUM 9.3   GFR: CrCl cannot be calculated (Unknown ideal weight.). Liver Function Tests:  Recent Labs Lab 12/31/16 1441  AST 22  ALT 15*  ALKPHOS 75  BILITOT 1.3*  PROT 6.8  ALBUMIN 3.2*    Recent Labs Lab 12/31/16 1441  LIPASE 19   No results for input(s): AMMONIA in the last 168 hours. Coagulation Profile: No results for input(s): INR, PROTIME in the last 168 hours. Cardiac Enzymes:  Recent Labs Lab 12/31/16 1850  CKTOTAL 80  TROPONINI 0.03*   BNP (last 3 results) No results for input(s): PROBNP in the last 8760  hours. HbA1C: No results for input(s): HGBA1C in the last 72 hours. CBG: No results for input(s): GLUCAP in the last 168 hours. Lipid Profile: No results for input(s): CHOL, HDL, LDLCALC, TRIG, CHOLHDL, LDLDIRECT in the last 72 hours. Thyroid Function Tests:  Recent Labs  01/01/17 0631  TSH 2.810  FREET4 1.00   Anemia Panel: No results for input(s): VITAMINB12, FOLATE, FERRITIN, TIBC, IRON, RETICCTPCT in the last 72 hours. Sepsis Labs:  Recent Labs Lab 12/31/16 1917  LATICACIDVEN 1.76    No results found for this or any previous visit (from the past 240 hour(s)).       Radiology Studies: Dg Chest 2 View  Result Date: 12/31/2016 CLINICAL DATA:  Patient fell 5 days ago. For age trial fibrillation, atrial flutter and hypertension EXAM: CHEST  2 VIEW COMPARISON:  None. FINDINGS: Cardiomegaly with aortic atherosclerosis. No pneumonic consolidation. Bibasilar atelectasis and/or scarring left greater than right. No overt pulmonary edema or pneumothorax. Vascular calcifications are seen bilaterally along the axillary arteries. IMPRESSION: Cardiomegaly with aortic atherosclerosis. No acute pulmonary disease. Bibasilar  atelectasis and/or scarring. Electronically Signed   By: Ashley Royalty M.D.   On: 12/31/2016 20:03   Dg Wrist Complete Left  Result Date: 12/31/2016 CLINICAL DATA:  Fall with pain.  Patient fell 5 days ago. EXAM: LEFT WRIST - COMPLETE 3+ VIEW COMPARISON:  None. FINDINGS: No fracture. No subluxation or dislocation. Degenerative changes are seen in the first carpometacarpal joint. IMPRESSION: 1. No acute findings. 2. Degenerative changes first carpometacarpal joint. Electronically Signed   By: Misty Stanley M.D.   On: 12/31/2016 20:01   Ct Head Wo Contrast  Result Date: 12/31/2016 CLINICAL DATA:  Fall 3 days ago.  Headache.  Initial encounter. EXAM: CT HEAD WITHOUT CONTRAST TECHNIQUE: Contiguous axial images were obtained from the base of the skull through the vertex  without intravenous contrast. COMPARISON:  None. FINDINGS: Brain: No evidence of acute infarction, hemorrhage, hydrocephalus, extra-axial collection or mass lesion/mass effect. Mild diffuse cerebral atrophy and chronic small vessel disease. Vascular: No hyperdense vessel or unexpected calcification. Skull: Normal. Negative for fracture or focal lesion. Sinuses/Orbits: Right mastoid effusion noted. Other: None. IMPRESSION: No acute intracranial abnormality. Mild cerebral atrophy and chronic small vessel disease. Right mastoid effusion. Electronically Signed   By: Earle Gell M.D.   On: 12/31/2016 20:36   Ct Soft Tissue Neck W Contrast  Addendum Date: 12/31/2016   ADDENDUM REPORT: 12/31/2016 21:45 ADDENDUM: Postobstructive RIGHT middle ear and mastoid effusions. Bone windows now submitted. Slight widening and irregularity of the RIGHT bony eustachian tube concerning for tumoral invasion. Electronically Signed   By: Elon Alas M.D.   On: 12/31/2016 21:45   Result Date: 12/31/2016 CLINICAL DATA:  Oral mass. Hypotensive, weakness. History of LEFT retroauricular cancer, prostate cancer. EXAM: CT NECK WITH CONTRAST TECHNIQUE: Multidetector CT imaging of the neck was performed using the standard protocol following the bolus administration of intravenous contrast. CONTRAST:  75 cc  ISOVUE-300 IOPAMIDOL (ISOVUE-300) INJECTION 61% COMPARISON:  None. FINDINGS: Pharynx and larynx: Approximately 16 x 22 x 19 mm mass RIGHT nasopharyngeal/ upper palatine tonsillar mass effacing the RIGHT fossa of Rosenmller, possible invasion of the RIGHT cavernous sinus, incompletely assessed. Hypopharyngeal edema compatible with post radiation change narrowing the LEFT vallecula. Patent piriform sinuses. Asymmetric fullness LEFT palatine tonsil with LEFT parapharyngeal fat stranding. Amorphous LEFT pterygoid muscles. Salivary glands: Fat stranding bilateral submandibular space. Thyroid: Normal. Lymph nodes: Matted LEFT lateral  pharyngeal lymph nodes. VASCULAR: Dense presumed hematoma RIGHT internal carotid artery origin with occlusion of the RIGHT internal carotid artery to the included cavernous segment. Limited intracranial: Normal. Visualized orbits: Normal. Mastoids and visualized paranasal sinuses: Bilateral maxillary sinus mucosal retention cyst. RIGHT middle ear in mastoid effusion. Small LEFT mastoid effusion. Skeleton: Poor dentition with multiple dental caries, periapical lucencies with bony re- absorption of the LEFT greater than RIGHT alveolar ridge and LEFT angle of the mandible. Chronic deformity RIGHT club proximal clavicle. Multilevel severe degenerative changes cervical spine. Upper chest: Ascending aortic aneurysm at 4.2 cm with moderate calcific atherosclerosis. Other: Thickened platysma. IMPRESSION: Limited assessment due to lack of prior imaging. 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 Possible skullbase invasion would be better characterized on MRI neck with contrast. 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 **An incidental finding of potential clinical significance  has been found. 4.2 cm ascending aortic aneurysm. Recommend annual imaging followup by CTA or MRA. This recommendation follows 2010 ACCF/AHA/AATS/ACR/ASA/SCA/SCAI/SIR/STS/SVM Guidelines for the Diagnosis and Management of Patients with Thoracic Aortic Disease. Circulation. 2010; [id number]** Acute findings discussed with and reconfirmed by PA CHRISTOPHER LAWYER on 12/31/2016 at 9:07 pm. Electronically Signed: By: Elon Alas M.D. On: 12/31/2016 21:09   Mr Jeri Cos [id number]  Contrast  Result Date: 01/01/2017 CLINICAL DATA:  Initial evaluation for transient left leg weakness, tremor. EXAM: MRI HEAD WITHOUT AND WITH CONTRAST TECHNIQUE: Multiplanar, multiecho pulse sequences of the brain and surrounding structures were obtained without and with intravenous contrast. CONTRAST:  15 cc of MultiHance. COMPARISON:  Prior CT from 12/31/2016. FINDINGS: Brain: Diffuse prominence of the CSF containing spaces is compatible with generalized cerebral atrophy. Patchy and confluent T2/FLAIR hyperintensity within the periventricular and deep white matter both cerebral hemispheres most compatible chronic small vessel ischemic disease, mild in nature. Mild chronic microvascular ischemic changes noted within the pons as well. No abnormal foci of restricted diffusion to suggest acute or subacute ischemia. Gray-white matter differentiation maintained. No evidence for chronic infarction. No evidence for acute or chronic intracranial hemorrhage. No mass lesion, midline shift, or mass effect. No hydrocephalus. No extra-axial fluid collection. Minimal serpiginous enhancement within the left occipital lobe favored to reflect a small DVA (series 23, image 14). No other abnormal enhancement. Pituitary gland and suprasellar region within normal limits. Vascular: Abnormal flow void within the right ICA and, compatible with previous identified right ICA occlusion. Major intracranial vascular flow voids otherwise maintained. Skull and upper cervical spine: Asymmetric fullness with enhancement at the right nasopharynx, compatible with previous identified nasopharyngeal mass. This measures approximately 15 x 27 mm on this exam (series 23, image 4). Finding concerning for possible recurrent or metastatic disease. There is asymmetric dural thickening with enhancement along the floor of the adjacent anteromedial left middle cranial fossa (series 24, image 21). Craniocervical junction within normal limits. Visualized upper  cervical spine unremarkable. Bone marrow signal intensity normal. No scalp soft tissue abnormality. Sinuses/Orbits: Globes and orbital soft tissues within normal limits. Scattered mucosal thickening within the maxillary sinuses and ethmoidal air cells. No air-fluid level to suggest active sinus infection. Right-sided mastoid effusion, likely postobstructive. Small left mastoid effusion noted as well. MRI NECK FINDINGS: Study moderately degraded by motion artifact. Previously identified right nasopharyngeal mass again seen. Overall, this lesion measures approximately 22 x 34 x 28 mm on this exam (AP by transverse by craniocaudad). Mass appears to extend into the petrous right temporal bone, and is intimately associated with the petrous right ICA (series 20, image 4). Subtle asymmetric dural thickening seen just superiorly at the floor of the left middle cranial fossa may reflect early skullbase invasion or possibly reactive changes (Series 22, image 16). Asymmetric enhancement within the right eustachian tube again concerning for possible tumoral invasion. Postobstructive right mastoid effusion noted. 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 Associated intimal enhancement, suggesting that this is acute in nature. Previously questioned left palatine tonsillar carcinoma is not clearly seen on this exam. The left tonsil itself is mildly prominent as compared to the right with relative partial effacement of the left parapharyngeal fat. However, no associated enhancement. 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 Mild asymmetric prominence with fullness within the adjacent right  oropharyngeal mucosa. Inferiorly, remainder of the hypopharynx and supraglottic larynx unremarkable. True cords grossly normal. Subglottic airway clear.  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 Changes are worse within the right mandibular body as compared to the left. Suspected post radiation changes within the bilateral submandibular spaces, slightly greater on the left. No appreciable adenopathy identified within the neck. Thyroid gland is normal. Salivary glands including the parotid and submandibular glands within normal limits. Visualized upper mediastinum grossly unremarkable. Partially visualized lungs are grossly clear. Visualized osseous structures demonstrate no acute abnormality. Moderate multilevel degenerate spondylolysis noted within the visualized cervical spine, greatest at C6-7. IMPRESSION: MRI HEAD IMPRESSION: 1. No acute intracranial process identified. 2. Mild chronic microvascular ischemic disease. MRI NECK IMPRESSION: 1. 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 Mass involves the petrous aspect of the right temporal bone superiorly, and is intimately associated with the petrous right ICA. Subtle asymmetric dural thickening and enhancement within the adjacent left middle cranial fossa may reflect early skullbase/intracranial extension and/or reactive changes. 2. 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 3. Asymmetric prominence of the left palatine tonsil without associated enhancement, with asymmetric enhancement within the right palatine tonsil (see above discussion). Correlation with direct visualization recommended. 4. 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 Again, possible infection/osteomyelitis not excluded. Electronically Signed   By: Jeannine Boga M.D.   On: 01/01/2017 05:36   Mr Neck Soft Tissue Only W Wo  Contrast  Result Date: 01/01/2017 CLINICAL DATA:  Initial evaluation for transient left leg weakness, tremor. EXAM: MRI HEAD WITHOUT AND WITH CONTRAST TECHNIQUE: Multiplanar, multiecho pulse sequences of the brain and surrounding structures were obtained without and with intravenous contrast. CONTRAST:  15 cc of MultiHance. COMPARISON:  Prior CT from 12/31/2016. FINDINGS: Brain: Diffuse prominence of the CSF containing spaces is compatible with generalized cerebral atrophy. Patchy and confluent T2/FLAIR hyperintensity within the periventricular and deep white matter both cerebral hemispheres most compatible chronic small vessel ischemic disease, mild in nature. Mild chronic microvascular ischemic changes noted within the pons as well. No abnormal foci of restricted diffusion to suggest acute or subacute ischemia. Gray-white matter differentiation maintained. No evidence for chronic infarction. No evidence for acute or chronic intracranial hemorrhage. No mass lesion, midline shift, or mass effect. No hydrocephalus. No extra-axial fluid collection. Minimal serpiginous enhancement within the left occipital lobe favored to reflect a small DVA (series 23, image 14). No other abnormal enhancement. Pituitary gland and suprasellar region within normal limits. Vascular: Abnormal flow void within the right ICA and, compatible with previous identified right ICA occlusion. Major intracranial vascular flow voids otherwise maintained. Skull and upper cervical spine: Asymmetric fullness with enhancement at the right nasopharynx, compatible with previous identified nasopharyngeal mass. This measures approximately 15 x 27 mm on this exam (series 23, image 4). Finding concerning for possible recurrent or metastatic disease. There is asymmetric dural thickening with enhancement along the floor of the adjacent anteromedial left middle cranial fossa (series 24, image 21). Craniocervical junction within normal limits. Visualized upper  cervical spine unremarkable. Bone marrow signal intensity normal. No scalp soft tissue abnormality. Sinuses/Orbits: Globes and orbital soft tissues within normal limits. Scattered mucosal thickening within the maxillary sinuses and ethmoidal air cells. No air-fluid level to suggest  active sinus infection. Right-sided mastoid effusion, likely postobstructive. Small left mastoid effusion noted as well. MRI NECK FINDINGS: Study moderately degraded by motion artifact. Previously identified right nasopharyngeal mass again seen. Overall, this lesion measures approximately 22 x 34 x 28 mm on this exam (AP by transverse by craniocaudad). Mass appears to extend into the petrous right temporal bone, and is intimately associated with the petrous right ICA (series 20, image 4). Subtle asymmetric dural thickening seen just superiorly at the floor of the left middle cranial fossa may reflect early skullbase invasion or possibly reactive changes (Series 22, image 16). Asymmetric enhancement within the right eustachian tube again concerning for possible tumoral invasion. Postobstructive right mastoid effusion noted. 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 Associated intimal enhancement, suggesting that this is acute in nature. Previously questioned left palatine tonsillar carcinoma is not clearly seen on this exam. The left tonsil itself is mildly prominent as compared to the right with relative partial effacement of the left parapharyngeal fat. However, no associated enhancement. 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 Mild asymmetric prominence with fullness within the adjacent right oropharyngeal mucosa. Inferiorly, remainder of the hypopharynx and supraglottic larynx unremarkable. True cords grossly normal. Subglottic airway clear.  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 Changes are worse within the right mandibular body as compared to the left. Suspected post radiation changes within the bilateral submandibular spaces, slightly greater on the left. No appreciable adenopathy identified within the neck. Thyroid gland is normal. Salivary glands including the parotid and submandibular glands within normal limits. Visualized upper mediastinum grossly unremarkable. Partially visualized lungs are grossly clear. Visualized osseous structures demonstrate no acute abnormality. Moderate multilevel degenerate spondylolysis noted within the visualized cervical spine, greatest at C6-7. IMPRESSION: MRI HEAD IMPRESSION: 1. No acute intracranial process identified. 2. Mild chronic microvascular ischemic disease. MRI NECK IMPRESSION: 1. 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 Mass involves the petrous aspect of the right temporal bone superiorly, and is intimately associated with the petrous right ICA. Subtle asymmetric dural thickening and enhancement within the adjacent left middle cranial fossa may reflect early skullbase/intracranial extension and/or reactive changes. 2. 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 3. Asymmetric prominence of the left palatine tonsil without associated enhancement, with asymmetric enhancement within the right palatine tonsil (see above discussion). Correlation with direct visualization recommended. 4. 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 Again, possible infection/osteomyelitis not excluded. Electronically Signed   By: Jeannine Boga M.D.   On: 01/01/2017 05:36        Scheduled Meds: .  atorvastatin  20 mg Oral Daily  . metoprolol succinate  12.5 mg Oral Daily   Continuous Infusions: . sodium chloride 125 mL/hr at 01/01/17 0949  . amiodarone Stopped (01/01/17 [id number])   Followed by  . amiodarone 30 mg/hr (01/01/17 0948)     LOS: 0 days    Time spent: [place], MD [facility] Pager [phone]  If 7PM-7AM, please contact night-coverage [email or web address] Password [facility] 01/01/2017, 9:51 AM

## 2017-01-01 NOTE — H&P (Addendum)
History and Physical    Tyler Holland [id number] DOB: [birth year] DOA: 12/31/2016  PCP: No PCP Per Patient  ENT: Dr. Constance Holster  Patient coming from: Home  Chief Complaint: Left leg "quivering", weakness, recurrent fall  HPI: Tyler Holland is a 71 y.o. gentleman with a history of head and neck cancer (diagnosed in 2012, treated in [place] with chemotherapy and radiation, details in dental clinic visit progress note from 11/25/16 by Dr. Enrique Sack), prostate cancer, HTN, and atrial fibrillation/flutter (CHADS-Vasc score of 3, he is not anticoagulated) who presents to the ED for evaluation after having two falls at home on Saturday.  The patient reports that he has had two distinct episodes of acute onset, involuntary, uncontrollable left leg tremor that ultimately causes him to fall.  Both times, he has had increased difficulty getting back on his feet, reporting that it takes him 30 minutes to "recover".  He denies LOC or associated confusion/post-ictal state.  He has had intermittent numbness and weakness in his left sided extremities as well.  The second episode was associated with stool incontinence.  He reports increased weakness and fatigue since the weekend.  He has been unable to get out of bed.  He has not been eating.  He has had intermittent palpitations and shortness of breath, but he denies chest pain.  He reports using antibiotic eardrops as prescribed three weeks ago for a right ear infection.  He was referred to Dr. Enrique Sack by Dr. Constance Holster for evaluation of right jaw pain and swelling.  ED Course: The patient was found to be in atrial fibrillation with RVR (though 12 lead EKG appears very regular to me).  He received cardizem 10mg  IV bolus x one before being started on the continuous infusion.  The ED attending was concerned about dehydration, and the patient received a 1L NS bolus.  Head CT showed a right mastoid effusion; otherwise, it was negative for acute process.  The ED attending  added on CT of the neck (soft tissue) with contrast.  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 --16 x 22 x 19 mm RIGHT skullbase mass, suspicious for metastatic disease, less likely second nasopharyngeal neoplasm. Possible skullbase invasion would be better characterized on MRI neck with contrast. 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 --Postobstructive RIGHT middle ear and mastoid effusions.  Slight widening and irregularity of the RIGHT bony eustachian tube concerning for tumoral invasion. --Incidental finding of a 4.2 cm ascending aortic aneurysm with moderate calcific atherosclerosis  Hospitalist asked to admit for further evaluation of LLE weakness and management of afib with RVR.  He also need additional MRI imaging for probable metastatic head and neck cancer.  Review of Systems: As per HPI otherwise 10 point review of systems negative.    Past Medical History:  Diagnosis Date  . A-fib ([place])   . Atrial flutter ([place])   . Cancer ([place])    behind L ear  . Hypertension   . Prostate cancer ([place]) 2002  . Status post radiation 09/2011    Past Surgical History:  Procedure Laterality Date  . PROSTATECTOMY  2002     reports that he has been smoking.  He has never used smokeless tobacco. He reports that he drinks alcohol. He reports that he does not use drugs. Active tobacco and  EtOH use.  Denies illicit drug use.  Allergies  Allergen Reactions  . Fish Allergy Swelling    Fresh water fish caused throat to swell (child)  . Penicillins Other (See Comments)    Unknown childhood allergic reaction    History reviewed. No pertinent family history. He denies any known family  history of malignancy.  Prior to Admission medications   Medication Sig Start Date End Date Taking? Authorizing Provider  atorvastatin (LIPITOR) 20 MG tablet Take 1 tablet (20 mg total) by mouth daily. 11/06/16 02/04/17 Yes Barry Dienes, NP  clonazePAM (KLONOPIN) 0.5 MG tablet Take 0.5 mg by mouth 2 (two) times daily as needed for anxiety.   Yes Historical Provider, MD  HYDROcodone-acetaminophen (NORCO) 7.5-325 MG tablet Take 0.5-1 tablets by mouth every 6 (six) hours as needed for pain. 11/13/16  Yes Historical Provider, MD  lisinopril (ZESTRIL) 2.5 MG tablet Take 1 tablet (2.5 mg total) by mouth daily. 11/06/16 02/04/17 Yes Barry Dienes, NP  metoprolol succinate (TOPROL-XL) 25 MG 24 hr tablet Take 0.5 tablets (12.5 mg total) by mouth daily. 11/06/16 02/04/17 Yes Barry Dienes, NP    Physical Exam: Vitals:   01/01/17 0100 01/01/17 0115 01/01/17 0130 01/01/17 0145  BP: 115/96 125/94 (!) 134/103 134/85  Pulse: (!) 143 (!) 144 (!) 143 (!) 144  Resp: 20 17 17 14   Temp:      TempSrc:      SpO2: 95% 94% 96% 95%      Constitutional: NAD, calm, comfortable, difficult historian Vitals:   01/01/17 0100 01/01/17 0115 01/01/17 0130 01/01/17 0145  BP: 115/96 125/94 (!) 134/103 134/85  Pulse: (!) 143 (!) 144 (!) 143 (!) 144  Resp: 20 17 17 14   Temp:      TempSrc:      SpO2: 95% 94% 96% 95%   Eyes: PERRL, lids and conjunctivae normal ENMT: Mucous membranes are moist. Posterior pharynx clear of any exudate or lesions. Poor dentition, missing several teeth. Neck: abnormal appearance, post radiation changes, no obvious external masses.   Respiratory: clear to auscultation listening anteriorly.  No wheezing.  Normal respiratory effort. No accessory muscle use.  Cardiovascular: Tachycardic.  No murmurs / rubs / gallops. No extremity edema. 2+ pedal pulses. GI: abdomen is soft and compressible.  No distention.  No tenderness.  No masses palpated.  Bowel sounds are present. Musculoskeletal:  No joint  deformity in upper and lower extremities. Good ROM, no contractures. Normal muscle tone.  Skin: no rashes, warm and dry Neurologic: CN 2-12 grossly intact. Sensation intact, Strength symmetric bilaterally, 5/5  Psychiatric: Normal judgment and insight. Alert and oriented x 3. Flat affect.    Labs on Admission: I have personally reviewed following labs and imaging studies  CBC:  Recent Labs Lab 12/31/16 1441  WBC 8.6  HGB 17.1*  HCT 50.1  MCV 100.2*  PLT [id number]   Basic Metabolic Panel:  Recent Labs Lab 12/31/16 1441  NA 134*  K 4.3  CL 97*  CO2 26  GLUCOSE 87  BUN 12  CREATININE 0.88  CALCIUM 9.3   GFR: CrCl cannot be calculated (Unknown ideal weight.). Liver Function Tests:  Recent Labs Lab 12/31/16 1441  AST 22  ALT 15*  ALKPHOS 75  BILITOT 1.3*  PROT 6.8  ALBUMIN 3.2*    Recent Labs Lab 12/31/16 1441  LIPASE 19   Cardiac Enzymes:  Recent Labs Lab 12/31/16 1850  CKTOTAL 80  TROPONINI 0.03*       Component Value Date/Time  COLORURINE YELLOW 01/01/2017 0054   APPEARANCEUR CLEAR 01/01/2017 0054   LABSPEC 1.045 (H) 01/01/2017 0054   PHURINE 5.0 01/01/2017 [place] 01/01/2017 0054   HGBUR NEGATIVE 01/01/2017 0054   BILIRUBINUR NEGATIVE 01/01/2017 0054   KETONESUR 20 (A) 01/01/2017 0054   PROTEINUR NEGATIVE 01/01/2017 0054   NITRITE NEGATIVE 01/01/2017 0054   LEUKOCYTESUR NEGATIVE 01/01/2017 0054   Sepsis Labs:  Lactic acid level 1.76.  Radiological Exams on Admission: Dg Chest 2 View  Result Date: 12/31/2016 CLINICAL DATA:  Patient fell 5 days ago. For age trial fibrillation, atrial flutter and hypertension EXAM: CHEST  2 VIEW COMPARISON:  None. FINDINGS: Cardiomegaly with aortic atherosclerosis. No pneumonic consolidation. Bibasilar atelectasis and/or scarring left greater than right. No overt pulmonary edema or pneumothorax. Vascular calcifications are seen bilaterally along the axillary arteries. IMPRESSION: Cardiomegaly  with aortic atherosclerosis. No acute pulmonary disease. Bibasilar atelectasis and/or scarring. Electronically Signed   By: Ashley Royalty M.D.   On: 12/31/2016 20:03   Dg Wrist Complete Left  Result Date: 12/31/2016 CLINICAL DATA:  Fall with pain.  Patient fell 5 days ago. EXAM: LEFT WRIST - COMPLETE 3+ VIEW COMPARISON:  None. FINDINGS: No fracture. No subluxation or dislocation. Degenerative changes are seen in the first carpometacarpal joint. IMPRESSION: 1. No acute findings. 2. Degenerative changes first carpometacarpal joint. Electronically Signed   By: Misty Stanley M.D.   On: 12/31/2016 20:01   Ct Head Wo Contrast  Result Date: 12/31/2016 CLINICAL DATA:  Fall 3 days ago.  Headache.  Initial encounter. EXAM: CT HEAD WITHOUT CONTRAST TECHNIQUE: Contiguous axial images were obtained from the base of the skull through the vertex without intravenous contrast. COMPARISON:  None. FINDINGS: Brain: No evidence of acute infarction, hemorrhage, hydrocephalus, extra-axial collection or mass lesion/mass effect. Mild diffuse cerebral atrophy and chronic small vessel disease. Vascular: No hyperdense vessel or unexpected calcification. Skull: Normal. Negative for fracture or focal lesion. Sinuses/Orbits: Right mastoid effusion noted. Other: None. IMPRESSION: No acute intracranial abnormality. Mild cerebral atrophy and chronic small vessel disease. Right mastoid effusion. Electronically Signed   By: Earle Gell M.D.   On: 12/31/2016 20:36   Ct Soft Tissue Neck W Contrast  Addendum Date: 12/31/2016   ADDENDUM REPORT: 12/31/2016 21:45 ADDENDUM: Postobstructive RIGHT middle ear and mastoid effusions. Bone windows now submitted. Slight widening and irregularity of the RIGHT bony eustachian tube concerning for tumoral invasion. Electronically Signed   By: Elon Alas M.D.   On: 12/31/2016 21:45   Result Date: 12/31/2016 CLINICAL DATA:  Oral mass. Hypotensive, weakness. History of LEFT retroauricular cancer,  prostate cancer. EXAM: CT NECK WITH CONTRAST TECHNIQUE: Multidetector CT imaging of the neck was performed using the standard protocol following the bolus administration of intravenous contrast. CONTRAST:  75 cc  ISOVUE-300 IOPAMIDOL (ISOVUE-300) INJECTION 61% COMPARISON:  None. FINDINGS: Pharynx and larynx: Approximately 16 x 22 x 19 mm mass RIGHT nasopharyngeal/ upper palatine tonsillar mass effacing the RIGHT fossa of Rosenmller, possible invasion of the RIGHT cavernous sinus, incompletely assessed. Hypopharyngeal edema compatible with post radiation change narrowing the LEFT vallecula. Patent piriform sinuses. Asymmetric fullness LEFT palatine tonsil with LEFT parapharyngeal fat stranding. Amorphous LEFT pterygoid muscles. Salivary glands: Fat stranding bilateral submandibular space. Thyroid: Normal. Lymph nodes: Matted LEFT lateral pharyngeal lymph nodes. VASCULAR: Dense presumed hematoma RIGHT internal carotid artery origin with occlusion of the RIGHT internal carotid artery to the included cavernous segment. Limited intracranial: Normal. Visualized orbits: Normal. Mastoids and visualized paranasal sinuses: Bilateral maxillary sinus mucosal retention  cyst. RIGHT middle ear in mastoid effusion. Small LEFT mastoid effusion. Skeleton: Poor dentition with multiple dental caries, periapical lucencies with bony re- absorption of the LEFT greater than RIGHT alveolar ridge and LEFT angle of the mandible. Chronic deformity RIGHT club proximal clavicle. Multilevel severe degenerative changes cervical spine. Upper chest: Ascending aortic aneurysm at 4.2 cm with moderate calcific atherosclerosis. Other: Thickened platysma. IMPRESSION: Limited assessment due to lack of prior imaging. 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 16 x 22 x 19 mm RIGHT skullbase mass, suspicious for metastatic disease, less  likely second nasopharyngeal neoplasm. Possible skullbase invasion would be better characterized on MRI neck with contrast. 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 **An incidental finding of potential clinical significance has been found. 4.2 cm ascending aortic aneurysm. Recommend annual imaging followup by CTA or MRA. This recommendation follows 2010 ACCF/AHA/AATS/ACR/ASA/SCA/SCAI/SIR/STS/SVM Guidelines for the Diagnosis and Management of Patients with Thoracic Aortic Disease. Circulation. 2010; [id number]** Acute findings discussed with and reconfirmed by PA CHRISTOPHER LAWYER on 12/31/2016 at 9:07 pm. Electronically Signed: By: Elon Alas M.D. On: 12/31/2016 21:09    EKG: Independently reviewed. Tachycardic but QRS interval appears regular.  I do not see a "sawtooth" pattern but I do not see distinct P wave either.   Assessment/Plan Principal Problem:   Transient left leg weakness Active Problems:   Dehydration   Atrial fibrillation with RVR (HCC)   Nasopharyngeal mass   ICAO (internal carotid artery occlusion), right   Ascending aortic aneurysm ([facility])      Transient left leg tremor, weakness.  Differential includes seizure, TIA.  Stroke unlikely based on description of symptoms.  Case discussed briefly with neurology consultant but formal consult request has not been made.  --Plan is to pursue MRI of brain with and without contrast to look for abnormalities, potential seizure focus.  Call neurology for acute findings. --EEG likely to be low yield in this case; not ordered at this time --Seizure precautions --Neurochecks --Telemetry monitoring  Refractory tachycardia, initially identified as a fib with RVR, patient has a history of same.  Will discuss with cardiology on call. --Cardizem drip turned off due to relative hypotension --Patient  currently off the monitor because he is in MRI --Plan to give home dose of metoprolol upon return from MRI and an additional 500cc of  NS (for a total of 2.5L).  Urine shows ketones so I still suspect he could be dry and or demonstrating rebound tachycardia after missing home medications for several days. --If heart rate remains refractory, will need to discuss further interventions with cardiology. --Fortunately, he is hemodynamically stable, so there is no need for urgent cardioversion. --Anticoagulation HELD at this point due to pending neuro evaluation.  Will need to be addressed in the AM. --Check troponin, TFTs  History of head and neck cancer, new right sided nasopharyngeal mass, likely recurrent/metastatic disease --MRI neck pending for further evaluation --Will need ENT, oncology referrals (inpatient vs outpatient?)  Incidental finding of 4.2 cm aneurysm of ascending aorta, acute occlusion of internal carotid artery (?secondary to metastatic head and neck cancer) --Will need to discuss with vascular surgery in the morning     DVT prophylaxis: SCDs until MRI brain is done Code Status: DNR Family Communication: Patient alone in the ED at time of admission. Disposition Plan: Expect he will go home  at discharge. Consults called: NONE Admission status: Place in observation, stepdown unit due to need for cardizem infusion for atrial fibrillation.   TIME SPENT: 70 minutes   Eber Jones MD [facility] Pager [phone]  If 7PM-7AM, please contact night-coverage [email or web address] Password [email or web address]  01/01/2017, 2:11 AM

## 2017-01-01 NOTE — ED Notes (Signed)
Junie Panning, Cardiology NP aware BP 101/72. Verbal order to still start cardizem at 2.5mg /hr and reevaluate to determine if rate can be increased to 5mg /hr

## 2017-01-02 ENCOUNTER — Inpatient Hospital Stay (HOSPITAL_COMMUNITY): Payer: Medicare Other

## 2017-01-02 DIAGNOSIS — G458 Other transient cerebral ischemic attacks and related syndromes: Secondary | ICD-10-CM

## 2017-01-02 DIAGNOSIS — G459 Transient cerebral ischemic attack, unspecified: Secondary | ICD-10-CM | POA: Diagnosis present

## 2017-01-02 DIAGNOSIS — I429 Cardiomyopathy, unspecified: Secondary | ICD-10-CM

## 2017-01-02 LAB — CBC
HCT: 43.2 % (ref 39.0–52.0)
Hemoglobin: 14.4 g/dL (ref 13.0–17.0)
MCH: 33.3 pg (ref 26.0–34.0)
MCHC: 33.3 g/dL (ref 30.0–36.0)
MCV: 100 fL (ref 78.0–100.0)
PLATELETS: 156 10*3/uL (ref 150–400)
RBC: 4.32 MIL/uL (ref 4.22–5.81)
RDW: 12.9 % (ref 11.5–15.5)
WBC: 6.3 10*3/uL (ref 4.0–10.5)

## 2017-01-02 LAB — HEMOGLOBIN A1C
Hgb A1c MFr Bld: 5.5 % (ref 4.8–5.6)
MEAN PLASMA GLUCOSE: 111 mg/dL

## 2017-01-02 LAB — BASIC METABOLIC PANEL
ANION GAP: 8 (ref 5–15)
BUN: 5 mg/dL — ABNORMAL LOW (ref 6–20)
CALCIUM: 8.5 mg/dL — AB (ref 8.9–10.3)
CO2: 25 mmol/L (ref 22–32)
CREATININE: 0.57 mg/dL — AB (ref 0.61–1.24)
Chloride: 101 mmol/L (ref 101–111)
GFR calc Af Amer: >60 mL/min (ref >60)
GLUCOSE: 104 mg/dL — AB (ref 65–99)
Potassium: 3.4 mmol/L — ABNORMAL LOW (ref 3.5–5.1)
Sodium: 134 mmol/L — ABNORMAL LOW (ref 135–145)

## 2017-01-02 LAB — MAGNESIUM: Magnesium: 1.3 mg/dL — ABNORMAL LOW (ref 1.7–2.4)

## 2017-01-02 MED ORDER — ACETAMINOPHEN 650 MG RE SUPP: 650.0000 mg | RECTAL | Status: DC | PRN

## 2017-01-02 MED ORDER — CLOPIDOGREL BISULFATE 75 MG PO TABS
75.0000 mg | ORAL_TABLET | Freq: Every day | ORAL | Status: DC
  Administered 2017-01-02: 75 mg via ORAL

## 2017-01-02 MED ORDER — GADOBENATE DIMEGLUMINE 529 MG/ML IV SOLN
20.0000 mL | Freq: Once | INTRAVENOUS | Status: AC
  Administered 2017-01-02: 20 mL via INTRAVENOUS

## 2017-01-02 MED ORDER — ACETAMINOPHEN 325 MG PO TABS: 650.0000 mg | ORAL_TABLET | ORAL | Status: DC | PRN

## 2017-01-02 MED ORDER — MAGNESIUM SULFATE 4 GM/100ML IV SOLN
4.0000 g | Freq: Once | INTRAVENOUS | Status: AC
  Administered 2017-01-02: 4 g via INTRAVENOUS
  Filled 2017-01-02: qty 100

## 2017-01-02 MED ORDER — DIGOXIN 125 MCG PO TABS
0.2500 mg | ORAL_TABLET | Freq: Every day | ORAL | Status: DC
  Administered 2017-01-02 – 2017-01-08 (×7): 0.25 mg via ORAL
  Filled 2017-01-02: qty 2
  Filled 2017-01-02 (×2): qty 1
  Filled 2017-01-02 (×2): qty 2
  Filled 2017-01-02 (×2): qty 1

## 2017-01-02 MED ORDER — STROKE: EARLY STAGES OF RECOVERY BOOK
Freq: Once | Status: AC
  Administered 2017-01-02: 08:00:00
  Filled 2017-01-02: qty 1

## 2017-01-02 MED ORDER — LIDOCAINE VISCOUS 2 % MT SOLN
15.0000 mL | OROMUCOSAL | Status: DC | PRN
  Filled 2017-01-02: qty 15

## 2017-01-02 MED ORDER — ENOXAPARIN SODIUM 40 MG/0.4ML ~~LOC~~ SOLN: 40.0000 mg | Freq: Every day | SUBCUTANEOUS | Status: DC

## 2017-01-02 MED ORDER — METOPROLOL SUCCINATE ER 25 MG PO TB24
25.0000 mg | ORAL_TABLET | Freq: Every day | ORAL | Status: DC
  Administered 2017-01-03: 25 mg via ORAL
  Filled 2017-01-02: qty 1

## 2017-01-02 MED ORDER — SENNOSIDES-DOCUSATE SODIUM 8.6-50 MG PO TABS
1.0000 | ORAL_TABLET | Freq: Every evening | ORAL | Status: DC | PRN
  Administered 2017-01-03: 1 via ORAL
  Filled 2017-01-02: qty 1

## 2017-01-02 MED ORDER — ACETAMINOPHEN 160 MG/5ML PO SOLN: 650.0000 mg | ORAL | Status: DC | PRN

## 2017-01-02 MED ORDER — ASPIRIN EC 81 MG PO TBEC: 81.0000 mg | DELAYED_RELEASE_TABLET | Freq: Every day | ORAL | Status: DC

## 2017-01-02 MED ORDER — APIXABAN 5 MG PO TABS
5.0000 mg | ORAL_TABLET | Freq: Two times a day (BID) | ORAL | Status: DC
  Administered 2017-01-02 – 2017-01-08 (×13): 5 mg via ORAL
  Filled 2017-01-02 (×13): qty 1

## 2017-01-02 MED ORDER — POTASSIUM CHLORIDE CRYS ER 20 MEQ PO TBCR
40.0000 meq | EXTENDED_RELEASE_TABLET | Freq: Once | ORAL | Status: AC
  Administered 2017-01-02: 40 meq via ORAL
  Filled 2017-01-02: qty 2

## 2017-01-02 NOTE — Progress Notes (Addendum)
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 History is obtained directly from the patient was a good historian.  The patient reports that on 12/28/16, he experienced 2 separate episodes where he had uncontrolled shaking of his left leg. Both of these occurred while he was walking around his apartment. He developed sudden jerking of his left leg significant enough that he fell to the ground. He denies any change in level of consciousness with these episodes. He states that he was unable to get up off of the ground afterwards, though says this is because he is weak all over. He is not specifically endorsing frank weakness in his left leg with these episodes. He has not had any further episodes since 12/28/16 and denies any similar episodes before.  Of note, he does report that for the past several months, he has had intermittent episodes of numbness in the left hand. These will last up to 2 hours and then resolve. He is not aware of any activities, movements, or positions that provoke this numbness. He states that he does not take aspirin or any other antiplatelet therapy. He is not on anticoagulation.  In the emergency department, he was noted to be in A. fib with RVR. He was started on a Cardizem drip but developed some hypotension. He was then transitioned to IV amiodarone. He had imaging of the head and neck including CT and MRI. These revealed no evidence of acute ischemia within the brain. However, he has occlusion of the right internal carotid artery which was felt to be acute. He was also noted to have a mass at the right skull base concerning for metastatic disease. EEG was ordered by the admitting physician and was unremarkable.  Neurology consultation is now requested for further recommendations  regarding these episodes of shaking in his left leg.   SUBJECTIVE (INTERVAL HISTORY) His  therapist is at the bedside.  He walking with a walker and assistance.Overall he feels his condition is stable.  MRA of the neck shows complete occlusion of the right internal carotid artery close to its origin. 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 with post radiation changes   OBJECTIVE Temp:  [97.4 F (36.3 C)-98.2 F (36.8 C)] 97.4 F (36.3 C) (01/25 0737) Pulse Rate:  [50-149] 70 (01/25 0737) Cardiac Rhythm: Atrial fibrillation (01/24 2306) Resp:  [12-36] 12 (01/25 0737) BP: (82-146)/(62-130) 127/105 (01/25 0737) SpO2:  [87 %-100 %] 94 % (01/25 0737) Weight:  [88.5 kg (195 lb)] 88.5 kg (195 lb) (01/24 1700)  CBC:   Recent Labs Lab 01/01/17 0934 01/02/17 0250  WBC 7.1 6.3  NEUTROABS 5.8  --   HGB 15.4 14.4  HCT 45.2 43.2  MCV 99.8 100.0  PLT 152 [id number]    Basic Metabolic Panel:   Recent Labs Lab 12/31/16 1441 01/01/17 0934  NA 134* 133*  K 4.3 3.5  CL 97* 101  CO2 26 23  GLUCOSE 87 156*  BUN 12 10  CREATININE 0.88 0.68  CALCIUM 9.3 8.3*  MG  --  1.3*    Lipid Panel:     Component Value Date/Time   CHOL 105 01/01/2017 1507   TRIG 47 01/01/2017 1507   HDL 37 (L) 01/01/2017 1507   CHOLHDL 2.8 01/01/2017 1507  VLDL 9 01/01/2017 1507   LDLCALC 59 01/01/2017 1507   HgbA1c:  Lab Results  Component Value Date   HGBA1C 5.5 01/01/2017   Urine Drug Screen: No results found for: LABOPIA, COCAINSCRNUR, LABBENZ, AMPHETMU, THCU, LABBARB    IMAGING  Dg Chest 2 View 12/31/2016 Cardiomegaly with aortic atherosclerosis. No acute pulmonary disease. Bibasilar atelectasis and/or scarring.    Dg Wrist Complete Left 12/31/2016 1. No acute findings.  2. Degenerative changes first carpometacarpal joint.   Ct Head Wo Contrast 12/31/2016 No acute intracranial abnormality. Mild cerebral atrophy and chronic small  vessel disease. Right mastoid effusion.    Ct Soft Tissue Neck W Contrast 12/31/2016   Postobstructive RIGHT middle ear and mastoid effusions. Bone windows now submitted.  Slight widening and irregularity of the RIGHT bony eustachian tube concerning for tumoral invasion.  Limited assessment due to lack of prior imaging.  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  Possible skullbase invasion would be better characterized on MRI neck with contrast. 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  An incidental finding of potential clinical significance has been found. 4.2 cm ascending aortic aneurysm. Recommend annual imaging followup by CTA or MRA.    Mr Tyler Holland Contrast  01/01/2017   MRI HEAD  1. No acute intracranial process identified. 2. Mild chronic microvascular ischemic disease.    MRI NECK Soft Tissue Only W Wo Contrast 1. 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 Mass involves the petrous aspect of the right temporal bone superiorly, and is intimately associated with the petrous right ICA. Subtle asymmetric dural thickening and enhancement within the adjacent left middle cranial fossa may reflect early skullbase/intracranial extension and/or reactive changes.  2. 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  3. Asymmetric prominence of the left palatine tonsil without  associated enhancement, with asymmetric enhancement within the right palatine tonsil (see above discussion). Correlation with direct visualization recommended.  4. 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 Again, possible infection/osteomyelitis not excluded.    MRA neck w/wo 1. Right ICA occlusion beginning at the bulb. The nonenhancing lumen is not collapsed, suggesting recent occlusion. 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 Patient has changes of head and neck radiotherapy, and obscured ICA stenosis may contribute. Right cavernous ICA reconstitution; limited circle-of-Willis collateral flow due to small communicating arteries. 2. Bilateral advanced vertebral ostial stenoses. EEG 01/01/2017 Description: The patient is awake and drowsy during the recording.  During maximal wakefulness, there is a symmetric, medium voltage 8 Hz posterior dominant rhythm that attenuates with eye opening.  The record is symmetric.  During drowsiness, there is an increase in theta slowing of the background.  Vertex waves and symmetric sleep spindles were not seen.  There were no epileptiform discharges or electrographic seizures seen.   EKG lead was unremarkable. Impression: This awake and drowsy EEG is normal.   Clinical Correlation: A normal EEG does not exclude a clinical diagnosis of epilepsy.  If  further clinical questions remain, prolonged EEG may be helpful.  Clinical correlation is advised.  2DEcho : LVEF 25-30%, normal wall thickness and cavity size, global   hypokinesis with more severe inferior hypokinesis to akinesis,   elevated LV filling pressure, dilated aortic root to 4.3 cm, mild   to moderate MR, severe biatrial enlargement, mild TR  PHYSICAL EXAM Pleasant elderly Caucasian male not in distress.on nasal canula oxygen. . Afebrile. Head is nontraumatic. Neck is supple without bruit.    Cardiac exam no  murmur or gallop. Lungs are clear to auscultation. Distal pulses are well felt.  Neurological Exam ;  Awake  Alert oriented x 3. Normal speech and language.eye movements full without nystagmus.fundi were not visualized. Vision acuity and fields appear normal. Hearing is normal. Palatal movements are normal. Face symmetric. Tongue midline. Normal strength, tone, reflexes and coordination. Normal sensation. Gait is cautious and using a walker but not ataxic     ASSESSMENT/PLAN Tyler Holland is a 71 y.o. 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  He did not receive IV t-PA due to late presentation.  Possible TIA vs seizure:  Non-dominant - embolic secondary to atrial fibrillation.  Resultant  no focal deficits  MRI - No acute intracranial process identified.  MRA - pending  EEG - normal  Carotid Doppler - in progress - unable to cancel  2D Echo - pending  LDL - pending  HgbA1c - pending  VTE prophylaxis - IV heparin and SCDs Diet NPO time specified  No antithrombotic prior to admission, now on heparin IV  Patient counseled to be compliant with his antithrombotic medications  Ongoing aggressive stroke risk factor management  Therapy recommendations: pending  Disposition: Pending  Hypertension  Somewhat low blood pressures  Permissive hypertension (OK if < 220/120) but gradually normalize in 5-7 days  Long-term BP goal normotensive  Hyperlipidemia  Home meds:  Lipitor 20 mg daily resumed in hospital  LDL pending, goal < 70  Continue statin at discharge    Other Stroke Risk Factors  Advanced age  Cigarette smoker - advised to stop smoking  ETOH use, advised to drink no more than 1 - 2 drinks per day  Overweight, Body mass index is 27.98  kg/m., recommend weight loss, diet and exercise as appropriate   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  Atrial fibrillation   Other Active Problems  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 He presented with 2 transient episodes of left lower extremity shaking in the setting of what appears to be acute right carotid occlusion likely limb shaking TIA. MRI shows no acute infarct in ED shows no definite seizure activity. Patient also has new-onset atrial fibrillation hence recommend anticoagulation with IV heparin and switched to eliquis. ENT/oncology evaluation for skull base metastasis and tumor avoid hypotension and keep systolic blood pressure greater than 120 .discuss  with Dr. Grandville Silos. Greater than 50% time during this 35 minute visit was spent on counseling and coordination of care about TIA, carotid occlusion, AFIB and answering questions   Antony Contras, MD Medical Director [facility] Pager: [phone] 01/02/2017 1:19 PM   To contact Stroke Continuity provider, please refer to [email or web address] After hours, contact General Neurology

## 2017-01-02 NOTE — Evaluation (Addendum)
Speech Language Pathology Evaluation Patient Details Name: Tyler Holland MRN: [id number] DOB: Dec 28, 1945 Today's Date: 01/02/2017 Time: [id number] SLP Time Calculation (min) (ACUTE ONLY): 27 min  Problem List:  Patient Active Problem List   Diagnosis Date Noted  . TIA (transient ischemic attack): Probable 01/02/2017  . Hypomagnesemia 01/02/2017  . Cardiomyopathy ([place]) 01/02/2017  . Right carotid artery occlusion   . Atrial fibrillation with RVR ([place]) 01/01/2017  . 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 ICAO (internal carotid artery occlusion), right 01/01/2017  . Transient left leg weakness 01/01/2017  . Ascending aortic aneurysm ([place]) 01/01/2017  . Left leg weakness   . Hypotension   . Hemispheric carotid artery syndrome   . Episode of shaking   . Numbness and tingling in left hand   . Carotid occlusion, right   . Dehydration 12/31/2016  . Otorrhea 11/28/2016   Past Medical History:  Past Medical History:  Diagnosis Date  . A-fib ([place])   . Atrial flutter ([place])   . Cancer ([place])    behind L ear  . Hypertension   . Prostate cancer ([place]) 2002  . Status post radiation 09/2011   Past Surgical History:  Past Surgical History:  Procedure Laterality Date  . PROSTATECTOMY  2002   HPI:  71 y.o. gentleman with a history of head and neck cancer (diagnosed in 2012, treated in [place] with chemotherapy and radiation, details in dental clinic visit progress note from 11/25/16 by Dr. Enrique Sack), prostate cancer, HTN, and atrial fibrillation/flutter (CHADS-Vasc score of 3, he is not anticoagulated) who presents to the ED on 01/01/17 for evaluation after having two falls at home on Saturday.  The patient reports that he has had two distinct episodes of acute onset, involuntary, uncontrollable left leg tremor that ultimately causes him to fall.  Both times, he has had increased difficulty getting back on his feet, reporting that it takes him 30 minutes to "recover".  He denies LOC or associated  confusion/post-ictal state.  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   Assessment / Plan / Recommendation Clinical Impression   Pt appears to exhibit cognitive/linguistic skills which are [facility] for tasks assessed during this session including, but not limited to multi-step directives, attention tasks, memory, and orientation.  Pt unable to complete writing/reading tasks d/t not having glasses available; pt able to state deficits in ADL's related to po intake d/t nasopharyngeal mass/poor dentition d/t post-chemo effects which affect mastication and currently with self-care needs.  Discussed possible ALF needs and pt is reluctant, but partially agreed to a change if indicated. Speech was 100% intelligible in conversation, but was slightly impacted by a combination of missing dentition/poor condition/oral pain.  Pt passed stroke swallow screen (with exception of solids d/t pain).  Requesting Dysphagia 1 (puree) diet; No ST f/u indicated at this time.    SLP Assessment  Patient does not need any further Speech Language Pathology Services    Follow Up Recommendations  None    Frequency and Duration   n/a        SLP Evaluation Cognition  Overall Cognitive Status: No family/caregiver present to determine baseline cognitive functioning Arousal/Alertness: Awake/alert Orientation Level: Oriented X4 Memory: Appears intact Awareness: Appears intact Problem Solving: Appears intact Safety/Judgment:  Appears intact       Comprehension  Auditory Comprehension Overall Auditory Comprehension: Appears within functional limits for tasks assessed Yes/No Questions: Within Functional  Limits Commands: Within Functional Limits Conversation: Complex Visual Recognition/Discrimination Discrimination: Not tested Reading Comprehension Reading Status: Unable to assess (comment) (glasses unavailable)    Expression Expression Primary Mode of Expression: Verbal Verbal Expression Overall Verbal Expression: Appears within functional limits for tasks assessed Level of Generative/Spontaneous Verbalization: Conversation Repetition: No impairment Naming: No impairment Pragmatics: No impairment Non-Verbal Means of Communication: Not applicable Written Expression Dominant Hand: Right Written Expression: Unable to assess (comment)   Oral / Motor  Oral Motor/Sensory Function Overall Oral Motor/Sensory Function: Within functional limits Motor Speech Overall Motor Speech: Appears within functional limits for tasks assessed Respiration: Within functional limits Phonation: Normal Resonance: Within functional limits Articulation: Within functional limitis Intelligibility: Intelligible Motor Planning: Witnin functional limits Motor Speech Errors: Not applicable Interfering Components: Inadequate dentition             Functional Assessment Tool Used:  (NOMS) Functional Limitations: Other Speech Language Pathology Other Speech-Language Pathology Functional Limitation [id number]): 0 percent impaired, limited or restricted         ADAMS,PAT, M.S., CCC-SLP 01/02/2017, 3:39 PM

## 2017-01-02 NOTE — Progress Notes (Signed)
ANTICOAGULATION CONSULT NOTE - Initial Consult  Pharmacy Consult for apixaban Indication: atrial fibrillation  Allergies  Allergen Reactions  . Fish Allergy Swelling    Fresh water fish caused throat to swell (child)  . Penicillins Other (See Comments)    Unknown childhood allergic reaction    Patient Measurements: Height: 5\' 10"  (177.8 cm) Weight: 195 lb (88.5 kg) IBW/kg (Calculated) : 73  Vital Signs: Temp: 98 F (36.7 C) (01/25 1102) Temp Source: Oral (01/25 1102) BP: 108/75 (01/25 1102) Pulse Rate: 76 (01/25 1102)  Labs:  Recent Labs  12/31/16 1441  12/31/16 1850 01/01/17 0934 01/01/17 1516 01/01/17 2119 01/02/17 0250  HGB 17.1*  --   --  15.4  --   --  14.4  HCT 50.1  --   --  45.2  --   --  43.2  PLT 174  --   --  152  --   --  156  HEPARINUNFRC  --   --   --   --   --  <0.10*  --   CREATININE 0.88  --   --  0.68  --   --   --   CKTOTAL  --   --  80  --   --   --   --   TROPONINI  --   < > 0.03* <0.03 <0.03 <0.03  --   < > = values in this interval not displayed.  Estimated Creatinine Clearance: 96.3 mL/min (by C-G formula based on SCr of 0.68 mg/dL). Clearance stable   Medical History: Past Medical History:  Diagnosis Date  . A-fib ([place])   . Atrial flutter ([place])   . Cancer ([place])    behind L ear  . Hypertension   . Prostate cancer ([place]) 2002  . Status post radiation 09/2011     Assessment: Patient has a hx of AFib w/o PTA anticoagulation admitted for TIA with total occlusion of the RICA. Pt is <80 yo, >60 kg, <1.5 SCr therefore 5 mg dose is appropriate.   Plan:  Initiate apixaban 5 mg BID Monitor for s/sx bleeding  Cheral Almas, PharmD Candidate 01/02/2017,11:34 AM

## 2017-01-02 NOTE — Progress Notes (Addendum)
PROGRESS NOTE    Tyler Holland  [id number] DOB: September 14, 1946 DOA: 12/31/2016 PCP: No PCP Per Patient    Brief Narrative:  Patient is a 71 year old gentleman history of head and neck cancer diagnosed in 2012 and treated in [place] with chemotherapy and radiation, history of prostate cancer, hypertension, atrial fibrillation/flutter presented to the ED after 2 falls at home with acute onset of uncontrollable left lower extremity tremor leading to his fall and significant weakness. In the ED patient noted to be in A. fib with RVR and borderline hypotension was initially on IV Cardizem and subsequently transitioned to IV amiodarone. 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 2-D echo done with a EF of 25-30% with global hypokinesis. EEG was negative. Concern for probable TIA and a such stroke workup underway.   Assessment & Plan:   Principal Problem:   TIA (transient ischemic attack): Probable Active Problems:   Atrial fibrillation with RVR (HCC)   ICAO (internal carotid artery occlusion), right   Dehydration   Nasopharyngeal mass   Transient left leg weakness   Ascending aortic aneurysm (HCC)   Hypomagnesemia   Cardiomyopathy (HCC)   Right carotid artery occlusion   #1 probable TIA transient left leg tremors/weakness Patient had presented with left leg tremors/weakness. Likely secondary to probable TIA in the setting of high-grade carotid lesion/carotid stenosis. MRI of the head with an acute occlusion of the right ICA. Patient with risk factors for TIA including A. fib, hypertension, age, history of tobacco use and not on any antiplatelet agents or anticoagulation prior to admission. EEG done was negative. MRA of the head and neck pending. 2-D echo with a cardiomyopathy and a EF of 25-30% without any source of emboli. Carotid Dopplers pending. Fasting lipid panel with LDL of 59. Continue Lipitor. Patient currently on IV heparin. PT/OT. Neurology following and  appreciate input and recommendations.  #2 A. fib with RVR versus SVT( CHA2DS2VASC score 3) Patient noted on admission to be in A. fib with RVR and initially placed on a Cardizem drip which was discontinued secondary to hypotension and patient on amiodarone drip. TSH within normal limits. Free T4 1. Cardiac enzymes negative 3. 2-D echo with a EF of 25-30% with global hypokinesis with more severe inferior hypokinesis to akinesis, elevated left ventricular filling pressure, dilated aortic root 4.3 cm, mild-to-moderate MR, severe biatrial enlargement, mild TR, RVSP 32 mmHg, dilated IVC. Patient currently on amiodarone drip and a Cardizem drip at 5mg /h, oral Toprol with improvement with rate control. Patient has been seen in consult by cardiology and cardiology following. Patient on IV heparin secondary to probable TIA and acute occlusion of right ICA. Cardiology following and appreciate input and recommendations.  #3 dehydration Continue IV fluids at 75 mL per hour.   #4 postobstructive right middle ear and mastoid effusions Consulted with ENT, however per nursing note have been from Dr. Constance Holster, ENTs office had called and recommended outpatient follow-up in the office. It is noted that ENT was aware of patient's MRI results.  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 Concern for recurrent tumor versus metastatic disease. Patient with a prior history of head and neck cancer involving the left side of his head behind his ear approximately 5 years ago, status post chemoradiation in [place], [place]. Consulted with ENT, however per nursing note have been from Dr. Constance Holster, ENTs office had called and recommended outpatient follow-up in the office. It is noted that ENT was aware of patient's  MRI results.   #6 acute complete occlusion of right ICA MRI of head and neck showing a acute appearing complete occlusion of the right ICA from bifurcation to the circle of Willis likely secondary to tumor  versus related therapy. Due to concern for probable TIA as patient had presented with left leg tremors and weakness MRA of the neck has been ordered per Neurology recommendations. Continue IV heparin drip. Avoid hypotension. Neurology following and appreciate input and recommendations.   #7 hypotension Likely secondary to hypovolemic hypotension versus cardiac etiology vs infetious etiology (unlikely as patient with no fever and normal white count). Cardiac enzymes negative 3. 2-D echo with a cardiomyopathy with a EF of 25-30%. Urinalysis nitrite negative leukocytes negative. Chest x-ray is negative for any acute infiltrate. Will check a random cortisol level. Decrease IV fluids to 75 mL per hour. Follow.   #8 cardiomyopathy 2-D echo with a EF of 25-30% with global hypokinesis with more severe inferior hypokinesis to akinesis, elevated left ventricular filling pressure, dilated aortic root to 4.3 cm, mild-to-moderate MR, severe biatrial enlargement, mild TR, RVSP 32 mm of hematocrit, dilated IVC. Patient currently on IV heparin secondary to acute occlusion of the right ICA. Patient also on IV Cardizem and IV amiodarone for atrial fibrillation. Fasting lipid panel with LDL of 59. Check a hemoglobin A1c. Continue Lipitor. Patient on oral Toprol-XL per cardiology. Cardiology following and appreciate input and recommendations.    DVT prophylaxis: Heparin Code Status: DNR Family Communication: updated patient, no family present. Disposition Plan: Home vs SNF pending workup.   Consultants:   Cardiology Dr. Martinique 01/01/2017>>>> Dr. Irven Shelling patient  Neurology: Dr. Shon Hale 01/01/2017  Procedures:   CT head 12/31/2016  CT soft tissue neck 12/31/2016  MRI head 01/01/2017  MRI neck 01/01/2017  Chest x-ray 12/31/2016  Plain films of the left wrist 12/31/2016  2-D echo 01/01/2017  EEG 01/01/2017  Antimicrobials:   None   Subjective: Patient complaining of mouth pain/tooth pain asking  for Viscous Lidocaine. Patient denies CP. No SOB. Patient denies any pain or tremors in his left leg.  Objective: Vitals:   01/02/17 0610 01/02/17 0630 01/02/17 0700 01/02/17 0737  BP: 98/82 113/86 113/89 (!) 127/105  Pulse:  67 78 70  Resp: 15 15 17 12   Temp:    97.4 F (36.3 C)  TempSrc:    Oral  SpO2: (!) 87% 94% 91% 94%  Weight:      Height:        Intake/Output Summary (Last 24 hours) at 01/02/17 0927 Last data filed at 01/02/17 0600  Gross per 24 hour  Intake          3886.62 ml  Output              800 ml  Net          3086.62 ml   Filed Weights   01/01/17 1700  Weight: 88.5 kg (195 lb)    Examination:  General exam: Appears calm and comfortable  Respiratory system: Clear to auscultation anterior lung fields. Respiratory effort normal. Cardiovascular system: Irregularly irregular. No JVD, murmurs, rubs, gallops or clicks. No pedal edema. Gastrointestinal system: Abdomen is nondistended, soft and nontender. No organomegaly or masses felt. Normal bowel sounds heard. Central nervous system: Alert and oriented. No focal neurological deficits. Extremities: Symmetric 5 x 5 power. Skin: No rashes, lesions or ulcers Psychiatry: Judgement and insight appear normal. Mood & affect appropriate.     Data Reviewed: I have personally reviewed following labs  and imaging studies  CBC:  Recent Labs Lab 12/31/16 1441 01/01/17 0934 01/02/17 0250  WBC 8.6 7.1 6.3  NEUTROABS  --  5.8  --   HGB 17.1* 15.4 14.4  HCT 50.1 45.2 43.2  MCV 100.2* 99.8 100.0  PLT 174 152 [id number]   Basic Metabolic Panel:  Recent Labs Lab 12/31/16 1441 01/01/17 0934  NA 134* 133*  K 4.3 3.5  CL 97* 101  CO2 26 23  GLUCOSE 87 156*  BUN 12 10  CREATININE 0.88 0.68  CALCIUM 9.3 8.3*  MG  --  1.3*   GFR: Estimated Creatinine Clearance: 96.3 mL/min (by C-G formula based on SCr of 0.68 mg/dL). Liver Function Tests:  Recent Labs Lab 12/31/16 1441 01/01/17 0934  AST 22 21  ALT 15* 15*    ALKPHOS 75 60  BILITOT 1.3* 1.0  PROT 6.8 5.5*  ALBUMIN 3.2* 2.7*    Recent Labs Lab 12/31/16 1441  LIPASE 19   No results for input(s): AMMONIA in the last 168 hours. Coagulation Profile: No results for input(s): INR, PROTIME in the last 168 hours. Cardiac Enzymes:  Recent Labs Lab 12/31/16 1850 01/01/17 0934 01/01/17 1516 01/01/17 2119  CKTOTAL 80  --   --   --   TROPONINI 0.03* <0.03 <0.03 <0.03   BNP (last 3 results) No results for input(s): PROBNP in the last 8760 hours. HbA1C:  Recent Labs  01/01/17 1507  HGBA1C 5.5   CBG: No results for input(s): GLUCAP in the last 168 hours. Lipid Profile:  Recent Labs  01/01/17 1507  CHOL 105  HDL 37*  LDLCALC 59  TRIG 47  CHOLHDL 2.8   Thyroid Function Tests:  Recent Labs  01/01/17 0631  TSH 2.810  FREET4 1.00   Anemia Panel: No results for input(s): VITAMINB12, FOLATE, FERRITIN, TIBC, IRON, RETICCTPCT in the last 72 hours. Sepsis Labs:  Recent Labs Lab 12/31/16 1917  LATICACIDVEN 1.76    Recent Results (from the past 240 hour(s))  MRSA PCR Screening     Status: None   Collection Time: 01/01/17  5:10 PM  Result Value Ref Range Status   MRSA by PCR NEGATIVE NEGATIVE Final    Comment:        The GeneXpert MRSA Assay (FDA approved for NASAL specimens only), is one component of a comprehensive MRSA colonization surveillance program. It is not intended to diagnose MRSA infection nor to guide or monitor treatment for MRSA infections.          Radiology Studies: Dg Chest 2 View  Result Date: 12/31/2016 CLINICAL DATA:  Patient fell 5 days ago. For age trial fibrillation, atrial flutter and hypertension EXAM: CHEST  2 VIEW COMPARISON:  None. FINDINGS: Cardiomegaly with aortic atherosclerosis. No pneumonic consolidation. Bibasilar atelectasis and/or scarring left greater than right. No overt pulmonary edema or pneumothorax. Vascular calcifications are seen bilaterally along the axillary  arteries. IMPRESSION: Cardiomegaly with aortic atherosclerosis. No acute pulmonary disease. Bibasilar atelectasis and/or scarring. Electronically Signed   By: Ashley Royalty M.D.   On: 12/31/2016 20:03   Dg Wrist Complete Left  Result Date: 12/31/2016 CLINICAL DATA:  Fall with pain.  Patient fell 5 days ago. EXAM: LEFT WRIST - COMPLETE 3+ VIEW COMPARISON:  None. FINDINGS: No fracture. No subluxation or dislocation. Degenerative changes are seen in the first carpometacarpal joint. IMPRESSION: 1. No acute findings. 2. Degenerative changes first carpometacarpal joint. Electronically Signed   By: Misty Stanley M.D.   On: 12/31/2016 20:01   Ct Head  Wo Contrast  Result Date: 12/31/2016 CLINICAL DATA:  Fall 3 days ago.  Headache.  Initial encounter. EXAM: CT HEAD WITHOUT CONTRAST TECHNIQUE: Contiguous axial images were obtained from the base of the skull through the vertex without intravenous contrast. COMPARISON:  None. FINDINGS: Brain: No evidence of acute infarction, hemorrhage, hydrocephalus, extra-axial collection or mass lesion/mass effect. Mild diffuse cerebral atrophy and chronic small vessel disease. Vascular: No hyperdense vessel or unexpected calcification. Skull: Normal. Negative for fracture or focal lesion. Sinuses/Orbits: Right mastoid effusion noted. Other: None. IMPRESSION: No acute intracranial abnormality. Mild cerebral atrophy and chronic small vessel disease. Right mastoid effusion. Electronically Signed   By: Earle Gell M.D.   On: 12/31/2016 20:36   Ct Soft Tissue Neck W Contrast  Addendum Date: 12/31/2016   ADDENDUM REPORT: 12/31/2016 21:45 ADDENDUM: Postobstructive RIGHT middle ear and mastoid effusions. Bone windows now submitted. Slight widening and irregularity of the RIGHT bony eustachian tube concerning for tumoral invasion. Electronically Signed   By: Elon Alas M.D.   On: 12/31/2016 21:45   Result Date: 12/31/2016 CLINICAL DATA:  Oral mass. Hypotensive, weakness. History of  LEFT retroauricular cancer, prostate cancer. EXAM: CT NECK WITH CONTRAST TECHNIQUE: Multidetector CT imaging of the neck was performed using the standard protocol following the bolus administration of intravenous contrast. CONTRAST:  75 cc  ISOVUE-300 IOPAMIDOL (ISOVUE-300) INJECTION 61% COMPARISON:  None. FINDINGS: Pharynx and larynx: Approximately 16 x 22 x 19 mm mass RIGHT nasopharyngeal/ upper palatine tonsillar mass effacing the RIGHT fossa of Rosenmller, possible invasion of the RIGHT cavernous sinus, incompletely assessed. Hypopharyngeal edema compatible with post radiation change narrowing the LEFT vallecula. Patent piriform sinuses. Asymmetric fullness LEFT palatine tonsil with LEFT parapharyngeal fat stranding. Amorphous LEFT pterygoid muscles. Salivary glands: Fat stranding bilateral submandibular space. Thyroid: Normal. Lymph nodes: Matted LEFT lateral pharyngeal lymph nodes. VASCULAR: Dense presumed hematoma RIGHT internal carotid artery origin with occlusion of the RIGHT internal carotid artery to the included cavernous segment. Limited intracranial: Normal. Visualized orbits: Normal. Mastoids and visualized paranasal sinuses: Bilateral maxillary sinus mucosal retention cyst. RIGHT middle ear in mastoid effusion. Small LEFT mastoid effusion. Skeleton: Poor dentition with multiple dental caries, periapical lucencies with bony re- absorption of the LEFT greater than RIGHT alveolar ridge and LEFT angle of the mandible. Chronic deformity RIGHT club proximal clavicle. Multilevel severe degenerative changes cervical spine. Upper chest: Ascending aortic aneurysm at 4.2 cm with moderate calcific atherosclerosis. Other: Thickened platysma. IMPRESSION: Limited assessment due to lack of prior imaging. 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 16 x 22 x 19 mm RIGHT skullbase mass, suspicious for  metastatic disease, less likely second nasopharyngeal neoplasm. Possible skullbase invasion would be better characterized on MRI neck with contrast. 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 **An incidental finding of potential clinical significance has been found. 4.2 cm ascending aortic aneurysm. Recommend annual imaging followup by CTA or MRA. This recommendation follows 2010 ACCF/AHA/AATS/ACR/ASA/SCA/SCAI/SIR/STS/SVM Guidelines for the Diagnosis and Management of Patients with Thoracic Aortic Disease. Circulation. 2010; [id number]** Acute findings discussed with and reconfirmed by PA CHRISTOPHER LAWYER on 12/31/2016 at 9:07 pm. Electronically Signed: By: Elon Alas M.D. On: 12/31/2016 21:09   Mr Jeri Cos [id number] Contrast  Result Date: 01/01/2017 CLINICAL DATA:  Initial evaluation for transient left leg weakness, tremor. EXAM: MRI HEAD WITHOUT AND WITH CONTRAST TECHNIQUE: Multiplanar, multiecho  pulse sequences of the brain and surrounding structures were obtained without and with intravenous contrast. CONTRAST:  15 cc of MultiHance. COMPARISON:  Prior CT from 12/31/2016. FINDINGS: Brain: Diffuse prominence of the CSF containing spaces is compatible with generalized cerebral atrophy. Patchy and confluent T2/FLAIR hyperintensity within the periventricular and deep white matter both cerebral hemispheres most compatible chronic small vessel ischemic disease, mild in nature. Mild chronic microvascular ischemic changes noted within the pons as well. No abnormal foci of restricted diffusion to suggest acute or subacute ischemia. Gray-white matter differentiation maintained. No evidence for chronic infarction. No evidence for acute or chronic intracranial hemorrhage. No mass lesion, midline shift, or mass effect. No hydrocephalus. No extra-axial fluid collection. Minimal serpiginous  enhancement within the left occipital lobe favored to reflect a small DVA (series 23, image 14). No other abnormal enhancement. Pituitary gland and suprasellar region within normal limits. Vascular: Abnormal flow void within the right ICA and, compatible with previous identified right ICA occlusion. Major intracranial vascular flow voids otherwise maintained. Skull and upper cervical spine: Asymmetric fullness with enhancement at the right nasopharynx, compatible with previous identified nasopharyngeal mass. This measures approximately 15 x 27 mm on this exam (series 23, image 4). Finding concerning for possible recurrent or metastatic disease. There is asymmetric dural thickening with enhancement along the floor of the adjacent anteromedial left middle cranial fossa (series 24, image 21). Craniocervical junction within normal limits. Visualized upper cervical spine unremarkable. Bone marrow signal intensity normal. No scalp soft tissue abnormality. Sinuses/Orbits: Globes and orbital soft tissues within normal limits. Scattered mucosal thickening within the maxillary sinuses and ethmoidal air cells. No air-fluid level to suggest active sinus infection. Right-sided mastoid effusion, likely postobstructive. Small left mastoid effusion noted as well. MRI NECK FINDINGS: Study moderately degraded by motion artifact. Previously identified right nasopharyngeal mass again seen. Overall, this lesion measures approximately 22 x 34 x 28 mm on this exam (AP by transverse by craniocaudad). Mass appears to extend into the petrous right temporal bone, and is intimately associated with the petrous right ICA (series 20, image 4). Subtle asymmetric dural thickening seen just superiorly at the floor of the left middle cranial fossa may reflect early skullbase invasion or possibly reactive changes (Series 22, image 16). Asymmetric enhancement within the right eustachian tube again concerning for possible tumoral invasion.  Postobstructive right mastoid effusion noted. 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 Associated intimal enhancement, suggesting that this is acute in nature. Previously questioned left palatine tonsillar carcinoma is not clearly seen on this exam. The left tonsil itself is mildly prominent as compared to the right with relative partial effacement of the left parapharyngeal fat. However, no associated enhancement. 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 Mild asymmetric prominence with fullness within the adjacent right oropharyngeal mucosa. Inferiorly, remainder of the hypopharynx and supraglottic larynx unremarkable. True cords grossly normal. Subglottic airway clear. 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 Changes are worse within the right mandibular body as compared to the left. Suspected post radiation changes within the bilateral submandibular spaces, slightly greater on the left. No appreciable adenopathy identified within the neck. Thyroid gland is normal. Salivary glands including the parotid and submandibular glands within normal limits. Visualized upper mediastinum grossly unremarkable. Partially visualized lungs are grossly clear. Visualized osseous structures demonstrate  no acute abnormality. Moderate multilevel degenerate spondylolysis noted within the visualized cervical spine, greatest at C6-7. IMPRESSION: MRI HEAD IMPRESSION: 1. No acute intracranial process identified. 2. Mild chronic microvascular ischemic disease. MRI NECK IMPRESSION: 1. 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 Mass involves the petrous aspect of  the right temporal bone superiorly, and is intimately associated with the petrous right ICA. Subtle asymmetric dural thickening and enhancement within the adjacent left middle cranial fossa may reflect early skullbase/intracranial extension and/or reactive changes. 2. 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 3. Asymmetric prominence of the left palatine tonsil without associated enhancement, with asymmetric enhancement within the right palatine tonsil (see above discussion). Correlation with direct visualization recommended. 4. 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 Again, possible infection/osteomyelitis not excluded. Electronically Signed   By: Jeannine Boga M.D.   On: 01/01/2017 05:36   Mr Neck Soft Tissue Only W Wo Contrast  Result Date: 01/01/2017 CLINICAL DATA:  Initial evaluation for transient left leg weakness, tremor. EXAM: MRI HEAD WITHOUT AND WITH CONTRAST TECHNIQUE: Multiplanar, multiecho pulse sequences of the brain and surrounding structures were obtained without and with intravenous contrast. CONTRAST:  15 cc of MultiHance. COMPARISON:  Prior CT from 12/31/2016. FINDINGS: Brain: Diffuse prominence of the CSF containing spaces is compatible with generalized cerebral atrophy. Patchy and confluent T2/FLAIR hyperintensity within the periventricular and deep white matter both cerebral hemispheres most compatible chronic small vessel ischemic disease, mild in nature. Mild chronic microvascular ischemic changes noted within the pons as well. No abnormal foci of restricted diffusion to suggest acute or subacute ischemia. Gray-white matter differentiation maintained. No evidence for chronic infarction. No evidence for acute or chronic intracranial hemorrhage. No mass lesion, midline shift, or mass effect. No hydrocephalus. No extra-axial fluid collection. Minimal  serpiginous enhancement within the left occipital lobe favored to reflect a small DVA (series 23, image 14). No other abnormal enhancement. Pituitary gland and suprasellar region within normal limits. Vascular: Abnormal flow void within the right ICA and, compatible with previous identified right ICA occlusion. Major intracranial vascular flow voids otherwise maintained. Skull and upper cervical spine: Asymmetric fullness with enhancement at the right nasopharynx, compatible with previous identified nasopharyngeal mass. This measures approximately 15 x 27 mm on this exam (series 23, image 4). Finding concerning for possible recurrent or metastatic disease. There is asymmetric dural thickening with enhancement along the floor of the adjacent anteromedial left middle cranial fossa (series 24, image 21). Craniocervical junction within normal limits. Visualized upper cervical spine unremarkable. Bone marrow signal intensity normal. No scalp soft tissue abnormality. Sinuses/Orbits: Globes and orbital soft tissues within normal limits. Scattered mucosal thickening within the maxillary sinuses and ethmoidal air cells. No air-fluid level to suggest active sinus infection. Right-sided mastoid effusion, likely postobstructive. Small left mastoid effusion noted as well. MRI NECK FINDINGS: Study moderately degraded by motion artifact. Previously identified right nasopharyngeal mass again seen. Overall, this lesion measures approximately 22 x 34 x 28 mm on this exam (AP by transverse by craniocaudad). Mass appears to extend into the petrous right temporal bone, and is intimately associated with the petrous right ICA (series 20, image 4). Subtle asymmetric dural thickening seen just superiorly at the floor of the left middle cranial fossa may reflect early skullbase invasion or  possibly reactive changes (Series 22, image 16). Asymmetric enhancement within the right eustachian tube again concerning for possible tumoral invasion.  Postobstructive right mastoid effusion noted. 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 Associated intimal enhancement, suggesting that this is acute in nature. Previously questioned left palatine tonsillar carcinoma is not clearly seen on this exam. The left tonsil itself is mildly prominent as compared to the right with relative partial effacement of the left parapharyngeal fat. However, no associated enhancement. 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 Mild asymmetric prominence with fullness within the adjacent right oropharyngeal mucosa. Inferiorly, remainder of the hypopharynx and supraglottic larynx unremarkable. True cords grossly normal. Subglottic airway clear. 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 Changes are worse within the right mandibular body as compared to the left. Suspected post radiation changes within the bilateral submandibular spaces, slightly greater on the left. No appreciable adenopathy identified within the neck. Thyroid gland is normal. Salivary glands including the parotid and submandibular glands within normal limits. Visualized upper mediastinum grossly unremarkable. Partially visualized lungs are grossly clear. Visualized osseous structures demonstrate no acute abnormality. Moderate multilevel degenerate spondylolysis noted within the visualized cervical spine, greatest at C6-7. IMPRESSION: MRI HEAD IMPRESSION: 1. No acute intracranial process identified. 2. Mild chronic microvascular ischemic disease. MRI NECK IMPRESSION: 1. 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 Mass involves the petrous aspect of  the right temporal bone superiorly, and is intimately associated with the petrous right ICA. Subtle asymmetric dural thickening and enhancement within the adjacent left middle cranial fossa may reflect early skullbase/intracranial extension and/or reactive changes. 2. 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 3. Asymmetric prominence of the left palatine tonsil without associated enhancement, with asymmetric enhancement within the right palatine tonsil (see above discussion). Correlation with direct visualization recommended. 4. 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 Again, possible infection/osteomyelitis not excluded. Electronically Signed   By: Jeannine Boga M.D.   On: 01/01/2017 05:36        Scheduled Meds: . atorvastatin  20 mg Oral Daily  . feeding supplement (ENSURE ENLIVE)  237 mL Oral BID BM  . mouth rinse  15 mL Mouth Rinse BID  . metoprolol succinate  12.5 mg Oral Daily  . pneumococcal 23 valent vaccine  0.5 mL Intramuscular Tomorrow-1000   Continuous Infusions: . sodium chloride 100 mL/hr at 01/02/17 0755  . amiodarone 30 mg/hr (01/02/17 0500)  . diltiazem (CARDIZEM) infusion 5 mg/hr (01/02/17 0617)  . heparin 1,350 Units/hr (01/02/17 0042)     LOS: 1 day    Time spent: Gerrard, MD [facility] Pager [phone]  If 7PM-7AM, please contact night-coverage [email or web address] Password [facility] 01/02/2017, 9:27 AM

## 2017-01-02 NOTE — Evaluation (Signed)
Physical Therapy Evaluation Patient Details Name: Eleazer Barquero MRN: [id number] DOB: 1946-12-02 Today's Date: 01/02/2017   History of Present Illness   Ethanalexander Widmark is a 71 y.o. gentleman with a history of head and neck cancer (diagnosed in 2012), prostate cancer, HTN, and atrial fibrillation/flutter presented to ED wtih Left leg "quivering", weakness, recurrent falls x2. 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  Clinical Impression  Pt admitted with above diagnosis and presents to PT with functional limitations due to deficits listed below (See PT problem list). Pt needs skilled PT to maximize independence and safety to allow discharge to ST-SNF prior to return home. Pt without focal weakness but has generalized weakness and decr balance.      Follow Up Recommendations SNF    Equipment Recommendations   (to be assessed)    Recommendations for Other Services       Precautions / Restrictions Precautions Precautions: Fall Restrictions Weight Bearing Restrictions: No      Mobility  Bed Mobility Overal bed mobility: Needs Assistance Bed Mobility: Supine to Sit     Supine to sit: Min assist     General bed mobility comments: Assist to elevate trunk into sitting. Incr time and verbal cues.  Transfers Overall transfer level: Needs assistance Equipment used: Rolling walker (2 wheeled) Transfers: Sit to/from Stand Sit to Stand: Min assist         General transfer comment: Assist to bring hips up and for balance  Ambulation/Gait Ambulation/Gait assistance: Min assist Ambulation Distance (Feet): 75 Feet Assistive device: Rolling walker (2 wheeled) Gait Pattern/deviations: Step-through pattern;Decreased step length -  left;Decreased step length - right Gait velocity: decr Gait velocity interpretation: Below normal speed for age/gender General Gait Details: Unsteady, slow gait requiring assist for balance  Stairs            Wheelchair Mobility    Modified Rankin (Stroke Patients Only) Modified Rankin (Stroke Patients Only) Pre-Morbid Rankin Score: No significant disability Modified Rankin: Moderately severe disability     Balance Overall balance assessment: Needs assistance Sitting-balance support: No upper extremity supported;Feet supported Sitting balance-Leahy Scale: Good     Standing balance support: Single extremity supported Standing balance-Leahy Scale: Poor Standing balance comment: UE support                             Pertinent Vitals/Pain Pain Assessment: No/denies pain    Home Living Family/patient expects to be discharged to:: Private residence Living Arrangements: Alone   Type of Home: Apartment Home Access: Stairs to enter Entrance Stairs-Rails: Right;Left;Can reach both Entrance Stairs-Number of Steps: 7 Home Layout: One level Home Equipment: Cane - single point      Prior Function Level of Independence: Independent               Hand Dominance   Dominant Hand: Right    Extremity/Trunk Assessment   Upper Extremity Assessment Upper Extremity Assessment: Defer to OT evaluation    Lower Extremity Assessment Lower Extremity Assessment: Generalized weakness       Communication   Communication: HOH  Cognition Arousal/Alertness: Awake/alert Behavior During Therapy: Flat affect Overall Cognitive Status: No family/caregiver present to determine baseline cognitive functioning Area of Impairment: Problem solving  Problem Solving: Slow processing      General Comments      Exercises     Assessment/Plan    PT Assessment Patient needs continued PT services  PT Problem List Decreased strength;Decreased activity  tolerance;Decreased balance;Decreased mobility;Decreased cognition;Decreased knowledge of use of DME          PT Treatment Interventions DME instruction;Gait training;Functional mobility training;Therapeutic activities;Therapeutic exercise;Stair training;Balance training;Cognitive remediation;Patient/family education    PT Goals (Current goals can be found in the Care Plan section)  Acute Rehab PT Goals Patient Stated Goal: not stated PT Goal Formulation: With patient Time For Goal Achievement: 01/16/17 Potential to Achieve Goals: Good    Frequency Min 3X/week   Barriers to discharge Inaccessible home environment;Decreased caregiver support lives alone and has stairs to enter    Co-evaluation               End of Session Equipment Utilized During Treatment: Gait belt Activity Tolerance: Patient tolerated treatment well Patient left: in chair;with call bell/phone within reach;with chair alarm set Nurse Communication: Mobility status         Time: [id number] PT Time Calculation (min) (ACUTE ONLY): 22 min   Charges:   PT Evaluation $PT Eval Moderate Complexity: 1 Procedure     PT G CodesShary Decamp [facility] 01-22-17, 11:48 AM Suanne Marker PT [phone]

## 2017-01-02 NOTE — Consult Note (Addendum)
Reason for Consult: A. Flutter with RVR. Caridomyopathy.  Ref MD: Wonda Olds  PCP: None   Subjective:   I had last seen the patient only once in August 2016 with no further follow-up.  Patient admitted with uncontrollable left leg jerking movements, generalized weakness, found to be in atrial flutter with rapid ventricular response in the emergency room.  He was admitted for evaluation of TIA.  Initial CT angiogram of the head and neck suggested acute occlusion of the right ICA and also noted to have a metastatic mass in the right skull base.  MRI of the head did not reveal any acute infarct however did confirm right nasopharyngeal mass with possible metastatic disease involving the right temporal bone.  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  Cardiology has been consulted to manage atrial flutter with RVR.  Patient this morning continues to complain of generalized weakness.  He has not had any further episodes of jerky movements of his lower extremities.  ROS: Generalized weakness present, chronic cough present, shortness of breath present.  Denies seizure or loss of consciousness.  Denies recent weight changes.  Denies bowel or bladder disturbances.  Other systems negative.  Social history: Smokes one half pack of cigarettes a day, drinks 3-4 ounces of vodka a day.  No history of illicit drug use. Single lives by himself.  Family history: No history of premature coronary artery disease or diabetes in the family.  Allergies  Allergen Reactions  . Fish Allergy Swelling    Fresh water fish caused throat to swell (child)  . Penicillins Other (See Comments)    Unknown childhood allergic reaction   Current Meds  Medication Sig  . atorvastatin (LIPITOR) 20 MG tablet Take 1 tablet (20 mg total) by mouth  daily.  . clonazePAM (KLONOPIN) 0.5 MG tablet Take 0.5 mg by mouth 2 (two) times daily as needed for anxiety.  Marland Kitchen HYDROcodone-acetaminophen (NORCO) 7.5-325 MG tablet Take 0.5-1 tablets by mouth every 6 (six) hours as needed for pain.  Marland Kitchen lisinopril (ZESTRIL) 2.5 MG tablet Take 1 tablet (2.5 mg total) by mouth daily.  . metoprolol succinate (TOPROL-XL) 25 MG 24 hr tablet Take 0.5 tablets (12.5 mg total) by mouth daily.     Objective:  Vital Signs in the last 24 hours: Temp:  [97.4 F (36.3 C)-98.2 F (36.8 C)] 98.1 F (36.7 C) (01/25 1615) Pulse Rate:  [42-134] 62 (01/25 1615) Resp:  [12-36] 20 (01/25 1102) BP: (82-146)/(62-130) 101/68 (01/25 1615) SpO2:  [87 %-96 %] 94 % (01/25 1615)  Intake/Output from previous day: 01/24 0701 - 01/25 0700 In: 3886.6 [I.V.:3386.6; IV Piggyback:500] Out: 1150 [Urine:1150]  Physical Exam:  Blood pressure 101/68, pulse 62, temperature 98.1 F (36.7 C), temperature source Oral, resp. rate 20, height 5\' 10"  (1.778 m), weight 88.5 kg (195 lb), SpO2 94 %.   General appearance: alert, cooperative, appears older than stated age, fatigued and no distress Eyes: negative findings: lids and lashes normal and conjunctivae and sclerae normal Neck: no carotid bruit, no JVD, thyroid not enlarged, symmetric, no tenderness/mass/nodules and Hard skin of the neck from prior RT and ? mass submandibular region right Neck: JVP - normal, carotids 2+= without bruits Resp: clear to auscultation bilaterally Chest wall: no tenderness Cardio: regular rate and rhythm, S1, S2 normal, no murmur, click, rub or gallop and tahycardia present GI: soft,  non-tender; bowel sounds normal; no masses,  no organomegaly Extremities: extremities normal, atraumatic, no cyanosis or edema  Lab Results: BMP  Recent Labs  12/31/16 1441 01/01/17 0934 01/02/17 1046  NA 134* 133* 134*  K 4.3 3.5 3.4*  CL 97* 101 101  CO2 26 23 25   GLUCOSE 87 156* 104*  BUN 12 10 5*  CREATININE 0.88  0.68 0.57*  CALCIUM 9.3 8.3* 8.5*  GFRNONAA >60 >60 >60  GFRAA >60 >60 >60   CMP     Component Value Date/Time   NA 134 (L) 01/02/2017 1046   K 3.4 (L) 01/02/2017 1046   CL 101 01/02/2017 1046   CO2 25 01/02/2017 1046   GLUCOSE 104 (H) 01/02/2017 1046   BUN 5 (L) 01/02/2017 1046   CREATININE 0.57 (L) 01/02/2017 1046   CALCIUM 8.5 (L) 01/02/2017 1046   PROT 5.5 (L) 01/01/2017 0934   ALBUMIN 2.7 (L) 01/01/2017 0934   AST 21 01/01/2017 0934   ALT 15 (L) 01/01/2017 0934   ALKPHOS 60 01/01/2017 0934   BILITOT 1.0 01/01/2017 0934   GFRNONAA >60 01/02/2017 1046   GFRAA >60 01/02/2017 1046    CBC  Recent Labs Lab 01/01/17 0934 01/02/17 0250  WBC 7.1 6.3  RBC 4.53 4.32  HGB 15.4 14.4  HCT 45.2 43.2  PLT 152 156  MCV 99.8 100.0  MCH 34.0 33.3  MCHC 34.1 33.3  RDW 12.8 12.9  LYMPHSABS 0.8  --   MONOABS 0.5  --   EOSABS 0.1  --   BASOSABS 0.0  --     HEMOGLOBIN A1C Lab Results  Component Value Date   HGBA1C 5.5 01/01/2017   MPG 111 01/01/2017    Cardiac Panel (last 3 results)  Recent Labs  12/31/16 1850 01/01/17 0934 01/01/17 1516 01/01/17 2119  CKTOTAL 80  --   --   --   TROPONINI 0.03* <0.03 <0.03 <0.03    Recent Labs  01/01/17 0631  TSH 2.810   Lipid Panel     Component Value Date/Time   CHOL 105 01/01/2017 1507   TRIG 47 01/01/2017 1507   HDL 37 (L) 01/01/2017 1507   CHOLHDL 2.8 01/01/2017 1507   VLDL 9 01/01/2017 1507   LDLCALC 59 01/01/2017 1507      Recent Labs  12/31/16 1441 01/01/17 0934  PROT 6.8 5.5*  ALBUMIN 3.2* 2.7*  AST 22 21  ALT 15* 15*  ALKPHOS 75 60  BILITOT 1.3* 1.0   Scheduled Meds: . apixaban  5 mg Oral BID  . atorvastatin  20 mg Oral Daily  . mouth rinse  15 mL Mouth Rinse BID  . metoprolol succinate  12.5 mg Oral Daily  . pneumococcal 23 valent vaccine  0.5 mL Intramuscular Tomorrow-1000   Continuous Infusions: . sodium chloride 75 mL/hr at 01/02/17 0914  . amiodarone 30 mg/hr (01/02/17 1614)  .  diltiazem (CARDIZEM) infusion 5 mg/hr (01/02/17 0617)   PRN Meds:.acetaminophen **OR** acetaminophen (TYLENOL) oral liquid 160 mg/5 mL **OR** acetaminophen, clonazePAM, HYDROcodone-acetaminophen, lidocaine, ondansetron **OR** ondansetron (ZOFRAN) IV, senna-docusate   Imaging: Imaging results have been reviewed  Cardiac Studies:  EKG 01/01/2017: Atrial flutter with 2:1 conduction at the rate of 135 bpm.  No evidence of ischemia.  Normal QT interval.  Echocardiogram 01/02/2016: Normal LV size, marked global hypokinesis, severe LV systolic dysfunction, EF [id number] with grade 2 diastolic dysfunction.  Mild aortic root dilatation at 43 mm.  Mild to moderate MR.  Mild RV dilatation.  Moderate to back percent.  Moderately reduced RV systolic function.  Mild pulmonary hypertension.  PA pressure 32 mmHg.  IVC dilated with blunted respiratory response.  Echocardiogram 04/2014(not my office, but outside) : Left ventricular size is mildly increased.  Wall thickness is mildly increased.  LVEF 50-55%.  Borderline global hypokinesis.  Mitral annulus is mildly calcified.  Leaflets are mildly thickened.  Systolic bowing without prolapse.  Mild, eccentric regurgitation.  Left atrium mildly dilated.  Carotid artery duplex 01/02/2017: Right ICA demonstrates nonocclusive disease, mild plaque left ICA.  Antegrade bilateral vertebral artery flow.  Out patient Abdominal aortic duplex 08/16/2015: Moderate dilatation of the abdominal aorta is noted in the mid and distal aorta. Diffuse plaque noted in the distal aorta. An abdominal aortic aneurysm measuring 4.28 x 4.38 x 4.48 cm is seen.  Assessment/Plan:  1.  Atrial flutter with 2:1 conduction.  Presently on IV heparin and now transitioned to Eliquis. History of ablation in 05/12/2012 in [place]. CHA2DS2-VASCScore: Risk Score  6,  Yearly risk of stroke  9.8.  2.  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 3.  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 Told to have normal coronary arteries in 2014 in [place].  May be related to atrial flutter with RVR, alcohol may be contributiing. Eccho in 2015 LVEF  55%. 4.  History of alcoholic abuse, tobacco use disorder, now drinks about 3 ounces of vodka per day and smokes about half a pack of cigarettes per day. 5.  Hypertension 6.  Hyperlipidemia 7.  Severe malnutrition with reduced serum albumin, serum BUN. 8.  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 9.  Abdominal aortic aneurysm by aortic duplex on 08/16/15 revealing 4.3 cm distal aneurysm.   Recommendation: I had seen him once in August 2016 and I have had no further follow-up.  He is new onset of cardiomyopathy and chronic systolic heart failure may be related to atrial flutter with RVR.  Although his EF is markedly reduced, he does not clinically appear to be in florid congestive heart failure.  His heart rate is much better controlled on the present medications, will add Digoxin. I will discontinue diltiazem and increase metoprolol succinate. BP is soft to add ACEi for CHF and cardiomyopathy.  Will try to wean off of amiodarone if possible.   Long-term prognosis appears to be guarded in view of secondaries in the neck close to the vascular structures.  I'll continue to follow the patient on sidelines.  I do not think there is any contraindication to switching him to oral anticoagulants and agree on eliquis.  Adrian Prows, M.D. 01/02/2017, 6:40 PM [facility] Pager: [phone] Office: [phone] If no answer: [phone]

## 2017-01-02 NOTE — Progress Notes (Signed)
Preliminary results by tech - Carotid Duplex Completed. Right ICA demonstrated known occlusive disease. Left ICA demonstrated a mild amount of plaque without significant stenosis noted. Vertebral arteries demonstrated antegrade flow.  Tyler Holland, BS, RDMS, RVT

## 2017-01-02 NOTE — Progress Notes (Signed)
Nutrition Brief Note  Patient identified on the Malnutrition Screening Tool (MST) Report  Wt Readings from Last 15 Encounters:  01/01/17 195 lb (88.5 kg)  11/06/16 180 lb (81.6 kg)   Tyler Holland is a 71 y.o. gentleman with a history of head and neck cancer (diagnosed in 2012, treated in [place] with chemotherapy and radiation, details in dental clinic visit progress note from 11/25/16 by Dr. Enrique Sack), prostate cancer, HTN, and atrial fibrillation/flutter (CHADS-Vasc score of 3, he is not anticoagulated) who presents to the ED for evaluation after having two falls at home on Saturday  Pt admitted with transient lt leg tremor and weakness. Per neurology evaluation, possible TIA related to RICA occlusion. Pt with new rt sided pharyngeal mass, concerning for metastatic head and neck cancer.   Case discussed with RN, pt just deen by speech. Pt with no swallowing deficits, but ordered a puree diet due to poor dentition.   Spoke with pt at bedside who reports pt consumes extremely soft foods due to lack of teeth. Pt typically consumes 2 meals per day- proteins, grains, fruits, and vegetables are either mashed finely or blended in a food processor. He is very eager to eat, as he has been NPO since admission.   Pt denies wt loss. Reports UBW is around 190#.   Nutrition-Focused physical exam completed. Findings are no fat depletion, mild muscle depletion, and no edema.   Body mass index is 27.98 kg/m. Patient meets criteria for overweight based on current BMI.   Current diet order is dysphagia 1, patient is consuming approximately n/a% of meals at this time. Labs and medications reviewed.   No nutrition interventions warranted at this time. If nutrition issues arise, please consult RD.   Avonda Toso A. Jimmye Norman, RD, LDN, CDE Pager: [phone] After hours Pager: [phone]

## 2017-01-02 NOTE — Progress Notes (Signed)
ANTICOAGULATION CONSULT NOTE - Follow-up Consult  Pharmacy Consult for heparin Indication: TIA in the setting of acute RICA occlusion  Allergies  Allergen Reactions  . Fish Allergy Swelling    Fresh water fish caused throat to swell (child)  . Penicillins Other (See Comments)    Unknown childhood allergic reaction    Patient Measurements: Height: 5\' 10"  (177.8 cm) Weight: 195 lb (88.5 kg) IBW/kg (Calculated) : 73  Vital Signs: Temp: 98.1 F (36.7 C) (01/24 2306) Temp Source: Oral (01/24 2306) BP: 109/78 (01/24 2306) Pulse Rate: 67 (01/24 2100)  Labs:  Recent Labs  12/31/16 1441  12/31/16 1850 01/01/17 0934 01/01/17 1516 01/01/17 2119  HGB 17.1*  --   --  15.4  --   --   HCT 50.1  --   --  45.2  --   --   PLT 174  --   --  152  --   --   HEPARINUNFRC  --   --   --   --   --  <0.10*  CREATININE 0.88  --   --  0.68  --   --   CKTOTAL  --   --  80  --   --   --   TROPONINI  --   < > 0.03* <0.03 <0.03 <0.03  < > = values in this interval not displayed.  Estimated Creatinine Clearance: 96.3 mL/min (by C-G formula based on SCr of 0.68 mg/dL).   Assessment:  71 yo M on heparin for TIA in the setting of acute RICA occlusion.  Pt also has atrial fibrillation, not on anticoagulants PTA.  D/w Dr. Shon Hale of neurology - use no bolus, low goal protocol.   Initial heparin level undetectable on 1050 units/hr. No issues with line or bleeding reported per RN.  Goal of Therapy:  Heparin level 0.3 - 0.5 units/ml Monitor platelets by anticoagulation protocol: Yes    Plan:  No bolus per neuro protocol Increase heparin gtt to 1350 units/hr Will f/u 8 hr heparin level  Sherlon Handing, PharmD, BCPS Clinical pharmacist, pager [phone] 01/02/2017 12:14 AM

## 2017-01-03 ENCOUNTER — Inpatient Hospital Stay (HOSPITAL_COMMUNITY): Payer: Medicare Other

## 2017-01-03 DIAGNOSIS — C760 Malignant neoplasm of head, face and neck: Secondary | ICD-10-CM

## 2017-01-03 LAB — BASIC METABOLIC PANEL
Anion gap: 7 (ref 5–15)
BUN: 7 mg/dL (ref 6–20)
CO2: 26 mmol/L (ref 22–32)
Calcium: 8.5 mg/dL — ABNORMAL LOW (ref 8.9–10.3)
Chloride: 101 mmol/L (ref 101–111)
Creatinine, Ser: 0.7 mg/dL (ref 0.61–1.24)
GFR calc Af Amer: >60 mL/min (ref >60)
Glucose, Bld: 101 mg/dL — ABNORMAL HIGH (ref 65–99)
POTASSIUM: 3.6 mmol/L (ref 3.5–5.1)
SODIUM: 134 mmol/L — AB (ref 135–145)

## 2017-01-03 LAB — CBC
HCT: 42.1 % (ref 39.0–52.0)
HEMOGLOBIN: 14 g/dL (ref 13.0–17.0)
MCH: 33.1 pg (ref 26.0–34.0)
MCHC: 33.3 g/dL (ref 30.0–36.0)
MCV: 99.5 fL (ref 78.0–100.0)
Platelets: 149 10*3/uL — ABNORMAL LOW (ref 150–400)
RBC: 4.23 MIL/uL (ref 4.22–5.81)
RDW: 12.8 % (ref 11.5–15.5)
WBC: 6.3 10*3/uL (ref 4.0–10.5)

## 2017-01-03 LAB — MAGNESIUM: MAGNESIUM: 1.7 mg/dL (ref 1.7–2.4)

## 2017-01-03 MED ORDER — FLUTICASONE PROPIONATE 50 MCG/ACT NA SUSP
2.0000 | Freq: Every day | NASAL | Status: DC
  Administered 2017-01-03 – 2017-01-05 (×3): 2 via NASAL
  Filled 2017-01-03: qty 16

## 2017-01-03 MED ORDER — AMIODARONE HCL 200 MG PO TABS
200.0000 mg | ORAL_TABLET | Freq: Every day | ORAL | Status: DC
  Administered 2017-01-03 – 2017-01-05 (×3): 200 mg via ORAL
  Filled 2017-01-03 (×3): qty 1

## 2017-01-03 MED ORDER — IPRATROPIUM BROMIDE 0.02 % IN SOLN
0.5000 mg | Freq: Four times a day (QID) | RESPIRATORY_TRACT | Status: DC
  Administered 2017-01-03 (×3): 0.5 mg via RESPIRATORY_TRACT
  Filled 2017-01-03 (×3): qty 2.5

## 2017-01-03 MED ORDER — LEVALBUTEROL HCL 0.63 MG/3ML IN NEBU
0.6300 mg | INHALATION_SOLUTION | Freq: Four times a day (QID) | RESPIRATORY_TRACT | Status: DC
  Administered 2017-01-03 (×3): 0.63 mg via RESPIRATORY_TRACT
  Filled 2017-01-03 (×3): qty 3

## 2017-01-03 MED ORDER — MAGNESIUM SULFATE 4 GM/100ML IV SOLN
4.0000 g | Freq: Once | INTRAVENOUS | Status: AC
  Administered 2017-01-03: 4 g via INTRAVENOUS
  Filled 2017-01-03: qty 100

## 2017-01-03 MED ORDER — GUAIFENESIN ER 600 MG PO TB12
1200.0000 mg | ORAL_TABLET | Freq: Two times a day (BID) | ORAL | Status: DC
  Administered 2017-01-03 – 2017-01-08 (×11): 1200 mg via ORAL
  Filled 2017-01-03 (×11): qty 2

## 2017-01-03 MED ORDER — LORATADINE 10 MG PO TABS
10.0000 mg | ORAL_TABLET | Freq: Every day | ORAL | Status: DC
  Administered 2017-01-03 – 2017-01-08 (×6): 10 mg via ORAL
  Filled 2017-01-03 (×6): qty 1

## 2017-01-03 MED ORDER — POTASSIUM CHLORIDE CRYS ER 20 MEQ PO TBCR
40.0000 meq | EXTENDED_RELEASE_TABLET | Freq: Once | ORAL | Status: AC
  Administered 2017-01-03: 40 meq via ORAL
  Filled 2017-01-03: qty 2

## 2017-01-03 MED ORDER — NICOTINE 21 MG/24HR TD PT24
21.0000 mg | MEDICATED_PATCH | Freq: Every day | TRANSDERMAL | Status: DC
  Administered 2017-01-03 – 2017-01-08 (×6): 21 mg via TRANSDERMAL
  Filled 2017-01-03 (×6): qty 1

## 2017-01-03 MED ORDER — METOPROLOL SUCCINATE ER 25 MG PO TB24
50.0000 mg | ORAL_TABLET | Freq: Every day | ORAL | Status: DC
  Administered 2017-01-04 – 2017-01-05 (×2): 50 mg via ORAL
  Filled 2017-01-03 (×2): qty 1
  Filled 2017-01-03: qty 2

## 2017-01-03 MED ORDER — MOMETASONE FURO-FORMOTEROL FUM 100-5 MCG/ACT IN AERO
2.0000 | INHALATION_SPRAY | Freq: Two times a day (BID) | RESPIRATORY_TRACT | Status: DC
  Administered 2017-01-04 – 2017-01-08 (×5): 2 via RESPIRATORY_TRACT
  Filled 2017-01-03 (×3): qty 8.8

## 2017-01-03 NOTE — Clinical Social Work Note (Signed)
Clinical Social Work Assessment  Patient Details  Name: Tyler Holland MRN: [id number] Date of Birth: [birth year]  Date of referral:  01/03/17               Reason for consult:  Facility Placement                Permission sought to share information with:  Facility Sport and exercise psychologist, Family Supports Permission granted to share information::  Yes, Verbal Permission Granted  Name::        Agency::  SNFs  Relationship::     Contact Information:     Housing/Transportation Living arrangements for the past 2 months:  Single Family Home Source of Information:  Patient Patient Interpreter Needed:  None Criminal Activity/Legal Involvement Pertinent to Current Situation/Hospitalization:  No - Comment as needed Significant Relationships:  Other Family Members Lives with:  Self Do you feel safe going back to the place where you live?  No Need for family participation in patient care:  No (Coment)  Care giving concerns:  CSW received consult for possible SNF placement at time of discharge. CSW spoke with patient regarding PT recommendation of SNF placement at time of discharge. Patient reported that he lives alone and is unable to return home given patient's current physical needs and fall risk. Patient has one neighbor and a nephew that help out. Patient expressed understanding of PT recommendation and is agreeable to SNF placement at time of discharge. CSW to continue to follow and assist with discharge planning needs.   Social Worker assessment / plan:  CSW spoke with patient concerning possibility of rehab at [facility] before returning home.  Employment status:  Retired Nurse, adult PT Recommendations:  [place] / Referral to community resources:  [place]  Patient/Family's Response to care:  Patient recognizes need for rehab before returning home and is agreeable to a SNF in [place]. Patient also asked about  transportation resources, which CSW provided to patient.   Patient/Family's Understanding of and Emotional Response to Diagnosis, Current Treatment, and Prognosis:  Patient/family is realistic regarding therapy needs and expressed being hopeful for SNF placement. Patient expressed understanding of CSW role and discharge process. No questions/concerns about plan or treatment.    Emotional Assessment Appearance:  Appears stated age Attitude/Demeanor/Rapport:  Other (Appropriate) Affect (typically observed):  Accepting, Appropriate Orientation:  Oriented to Self, Oriented to Place, Oriented to  Time, Oriented to Situation Alcohol / Substance use:  Not Applicable Psych involvement (Current and /or in the community):  No (Comment)  Discharge Needs  Concerns to be addressed:  Care Coordination Readmission within the last 30 days:  No Current discharge risk:  None Barriers to Discharge:  Continued Medical Work up   [employer], [place] 01/03/2017, 4:46 PM

## 2017-01-03 NOTE — Progress Notes (Signed)
CSW left voicemail for financial counseling regarding patient request of financial assistance status.   Tyler Holland LCSWA [phone]

## 2017-01-03 NOTE — NC FL2 (Signed)
[place] LEVEL OF CARE SCREENING TOOL     IDENTIFICATION  Patient Name: Tyler Holland Birthdate: 12/03/1946 Sex: male Admission Date (Current Location): 12/31/2016  [facility] and [place] Number:  Herbalist and Address:  [facility] [place]. [facility], [place] [address] [place], [place] [address]      Provider Number: [id number]  Attending Physician Name and Address:  [name], MD  Relative Name and Phone Number:       Current Level of Care: Hospital Recommended Level of Care: [place] Prior Approval Number:    Date Approved/Denied:   PASRR Number: [id number]  Discharge Plan: SNF    Current Diagnoses: Patient Active Problem List   Diagnosis Date Noted  . Head and neck cancer ([place])   . TIA (transient ischemic attack): Probable 01/02/2017  . Hypomagnesemia 01/02/2017  . Cardiomyopathy ([place]) 01/02/2017  . Right carotid artery occlusion   . Atrial fibrillation with RVR ([place]) 01/01/2017  . 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 ICAO (internal carotid artery occlusion), right 01/01/2017  . Transient left leg weakness 01/01/2017  . Ascending aortic aneurysm ([place]) 01/01/2017  . Left leg weakness   . Hypotension   . Hemispheric carotid artery syndrome   . Episode of shaking   . Numbness and tingling in left hand   . Carotid occlusion, right   . Dehydration 12/31/2016  . Otorrhea 11/28/2016    Orientation RESPIRATION BLADDER Height & Weight     Self, Time, Place, Situation  O2 (Nasal cannula 3L) Continent Weight: 88.5 kg (195 lb) Height:  5\' 10"  (177.8 cm)  BEHAVIORAL SYMPTOMS/MOOD NEUROLOGICAL BOWEL NUTRITION STATUS      Continent Diet (Please see DC Summary)  AMBULATORY STATUS COMMUNICATION OF NEEDS Skin   Limited Assist Verbally Normal                       Personal Care Assistance Level of Assistance  Bathing, Feeding, Dressing Bathing Assistance: Limited assistance Feeding assistance:  Independent Dressing Assistance: Limited assistance     Functional Limitations Info             SPECIAL CARE FACTORS FREQUENCY  PT (By licensed PT)     PT Frequency: 5x/week              Contractures      Additional Factors Info  Code Status, Allergies Code Status Info: DNR Allergies Info: Fish Allergy, Penicillins           Current Medications (01/03/2017):  This is the current hospital active medication list Current Facility-Administered Medications  Medication Dose Route Frequency Provider Last Rate Last Dose  . acetaminophen (TYLENOL) tablet 650 mg  650 mg Oral Q4H PRN [name], MD       Or  . acetaminophen (TYLENOL) solution 650 mg  650 mg Per Tube Q4H PRN [name], MD       Or  . acetaminophen (TYLENOL) suppository 650 mg  650 mg Rectal Q4H PRN [name], MD      . amiodarone (NEXTERONE PREMIX) 360-4.14 MG/200ML-% (1.8 mg/mL) IV infusion  30 mg/hr Intravenous Continuous [name], MD 16.7 mL/hr at 01/03/17 1545 30 mg/hr at 01/03/17 1545  . apixaban (ELIQUIS) tablet 5 mg  5 mg Oral BID [name], MD   5 mg at 01/03/17 1032  . atorvastatin (LIPITOR) tablet 20 mg  20 mg Oral Daily [name], MD   20 mg at 01/03/17 1032  .  clonazePAM (KLONOPIN) tablet 0.5 mg  0.5 mg Oral BID PRN [name], MD   0.5 mg at 01/02/17 2124  . digoxin (LANOXIN) tablet 0.25 mg  0.25 mg Oral Daily Adrian Prows, MD   0.25 mg at 01/03/17 1032  . diltiazem (CARDIZEM) 100 mg in dextrose 5 % 100 mL (1 mg/mL) infusion  5 mg/hr Intravenous Continuous Arbutus Leas, NP 5 mL/hr at 01/03/17 1611 5 mg/hr at 01/03/17 1611  . fluticasone (FLONASE) 50 MCG/ACT nasal spray 2 spray  2 spray Each Nare Daily [name], MD   2 spray at 01/03/17 1549  . guaiFENesin (MUCINEX) 12 hr tablet 1,200 mg  1,200 mg Oral BID [name], MD   1,200 mg at 01/03/17 1031  . HYDROcodone-acetaminophen (NORCO) 7.5-325 MG per tablet 0.5-1 tablet  0.5-1 tablet Oral Q6H PRN [name], MD      . ipratropium (ATROVENT) nebulizer solution 0.5 mg  0.5 mg Nebulization Q6H [name], MD   0.5 mg at 01/03/17 1312  . levalbuterol (XOPENEX) nebulizer solution 0.63 mg  0.63 mg Nebulization Q6H [name], MD   0.63 mg at 01/03/17 1312  . lidocaine (XYLOCAINE) 2 % viscous mouth solution 15 mL  15 mL Mouth/Throat Q3H PRN [name], MD   15 mL at 01/03/17 1028  . loratadine (CLARITIN) tablet 10 mg  10 mg Oral Daily [name], MD   10 mg at 01/03/17 1031  . MEDLINE mouth rinse  15 mL Mouth Rinse BID [name], MD   15 mL at 01/03/17 1027  . metoprolol succinate (TOPROL-XL) 24 hr tablet 25 mg  25 mg Oral Daily Adrian Prows, MD   25 mg at 01/03/17 1032  . mometasone-formoterol (DULERA) 100-5 MCG/ACT inhaler 2 puff  2 puff Inhalation BID Irine Seal V, MD      . nicotine (NICODERM CQ - dosed in mg/24 hours) patch 21 mg  21 mg Transdermal Daily [name], MD   21 mg at 01/03/17 1032  . ondansetron (ZOFRAN) tablet 4 mg  4 mg Oral Q6H PRN [name], MD       Or  . ondansetron [facility]) injection 4 mg  4 mg Intravenous Q6H PRN [name], MD      . pneumococcal 23 valent vaccine (PNU-IMMUNE) injection 0.5 mL  0.5 mL Intramuscular Tomorrow-1000 [name], MD      . senna-docusate (Senokot-S) tablet 1 tablet  1 tablet Oral QHS PRN [name], MD         Discharge Medications: Please see discharge summary for a list of discharge medications.  Relevant Imaging Results:  Relevant Lab Results:   Additional Information SSN: [place], [place]

## 2017-01-03 NOTE — Progress Notes (Addendum)
Subjective:  Patient states that he has been having difficulty swallowing.  Appears fatigued.  No chest pain or palpitations.  Objective:  Vital Signs in the last 24 hours: Temp:  [97.7 F (36.5 C)-99 F (37.2 C)] 97.8 F (36.6 C) (01/26 1622) Pulse Rate:  [26-140] 26 (01/26 1622) Resp:  [18-23] 23 (01/26 1622) BP: (94-122)/(64-90) 94/64 (01/26 1622) SpO2:  [95 %-99 %] 98 % (01/26 1312) FiO2 (%):  [3 %] 3 % (01/26 1137)  Intake/Output from previous day: 01/25 0701 - 01/26 0700 In: 2449.6 [I.V.:2449.6] Out: 1525 [Urine:1525]  Physical Exam: General appearance: alert, cooperative, appears older than stated age, fatigued and no distress. Ill appearing and unkempt. Eyes: negative findings: lids and lashes normal and conjunctivae and sclerae normal Neck: no carotid bruit, no JVD, thyroid not enlarged, symmetric, no tenderness/mass/nodules and Hard skin of the neck from prior RT and ? mass submandibular region right. Halitosis present  Neck: JVP - normal, carotids without bruits Resp: clear to auscultation bilaterally Chest wall: no tenderness Cardio: Irregular, S1 variable, S2 normal, no murmur, click, rub or gallop and tahycardia present GI: soft, non-tender; bowel sounds normal; no masses,  no organomegaly Extremities: extremities normal, atraumatic, no cyanosis or edema Lab Results: BMP  Recent Labs  01/01/17 0934 01/02/17 1046 01/03/17 0308  NA 133* 134* 134*  K 3.5 3.4* 3.6  CL 101 101 101  CO2 23 25 26   GLUCOSE 156* 104* 101*  BUN 10 5* 7  CREATININE 0.68 0.57* 0.70  CALCIUM 8.3* 8.5* 8.5*  GFRNONAA >60 >60 >60  GFRAA >60 >60 >60    CBC  Recent Labs Lab 01/01/17 0934  01/03/17 0308  WBC 7.1  < > 6.3  RBC 4.53  < > 4.23  HGB 15.4  < > 14.0  HCT 45.2  < > 42.1  PLT 152  < > 149*  MCV 99.8  < > 99.5  MCH 34.0  < > 33.1  MCHC 34.1  < > 33.3  RDW 12.8  < > 12.8  LYMPHSABS 0.8  --   --   MONOABS 0.5  --   --   EOSABS 0.1  --   --   BASOSABS 0.0  --   --    < > = values in this interval not displayed.  HEMOGLOBIN A1C Lab Results  Component Value Date   HGBA1C 5.5 01/01/2017   MPG 111 01/01/2017    Recent Labs  12/31/16 1850 01/01/17 0934 01/01/17 1516 01/01/17 2119  CKTOTAL 80  --   --   --   TROPONINI 0.03* <0.03 <0.03 <0.03    Recent Labs  01/01/17 0631  TSH 2.810    CHOLESTEROL  Recent Labs  01/01/17 1507  CHOL 105    Hepatic Function Panel  Recent Labs  12/31/16 1441 01/01/17 0934  PROT 6.8 5.5*  ALBUMIN 3.2* 2.7*  AST 22 21  ALT 15* 15*  ALKPHOS 75 60  BILITOT 1.3* 1.0    Imaging: Imaging results have been reviewed  Cardiac Studies: EKG 01/01/2017: Atrial flutter with 2:1 conduction at the rate of 135 bpm.  No evidence of ischemia.  Normal QT interval.  Tele 01/03/16: A. Fibrillation with RVR, HR better controlled.  Echocardiogram 01/02/2016: Normal LV size, marked global hypokinesis, severe LV systolic dysfunction, EF [id number] with grade 2 diastolic dysfunction.  Mild aortic root dilatation at 43 mm.  Mild to moderate MR.  Mild RV dilatation.  Moderate to back percent.  Moderately reduced RV systolic function.  Mild pulmonary hypertension.  PA pressure 32 mmHg.  IVC dilated with blunted respiratory response.  Echocardiogram 04/2014(not my office, but outside) : Left ventricular size is mildly increased.  Wall thickness is mildly increased.  LVEF 50-55%.  Borderline global hypokinesis.  Mitral annulus is mildly calcified.  Leaflets are mildly thickened.  Systolic bowing without prolapse.  Mild, eccentric regurgitation.  Left atrium mildly dilated.  Carotid artery duplex 01/02/2017: Right ICA demonstrates nonocclusive disease, mild plaque left ICA.  Antegrade bilateral vertebral artery flow.  Out patient Abdominal aortic duplex 08/16/2015: Moderate dilatation of the abdominal aorta is noted in the mid and distal aorta. Diffuse plaque noted in the distal aorta. An abdominal aortic aneurysm measuring 4.28  x 4.38 x 4.48 cm is seen.  Scheduled Meds: . apixaban  5 mg Oral BID  . atorvastatin  20 mg Oral Daily  . digoxin  0.25 mg Oral Daily  . fluticasone  2 spray Each Nare Daily  . guaiFENesin  1,200 mg Oral BID  . ipratropium  0.5 mg Nebulization Q6H  . levalbuterol  0.63 mg Nebulization Q6H  . loratadine  10 mg Oral Daily  . mouth rinse  15 mL Mouth Rinse BID  . metoprolol succinate  25 mg Oral Daily  . mometasone-formoterol  2 puff Inhalation BID  . nicotine  21 mg Transdermal Daily  . pneumococcal 23 valent vaccine  0.5 mL Intramuscular Tomorrow-1000   Continuous Infusions: . amiodarone 30 mg/hr (01/03/17 1545)  . diltiazem (CARDIZEM) infusion 5 mg/hr (01/03/17 1611)   PRN Meds:.acetaminophen **OR** acetaminophen (TYLENOL) oral liquid 160 mg/5 mL **OR** acetaminophen, clonazePAM, HYDROcodone-acetaminophen, lidocaine, ondansetron **OR** ondansetron (ZOFRAN) IV, senna-docusate   Assessment/Plan:  1.  Atrial flutter with 2:1 conduction. Now in A. Fibrillation with HR better controlled.  Presently on  Eliquis. History of ablation in 05/12/2012 in [place]. CHA2DS2-VASCScore: Risk Score  6,  Yearly risk of stroke  9.8.  2.  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 3.  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 Told to have normal coronary arteries in 2014 in [place].  May be related to atrial flutter with RVR, alcohol may be contributiing. Eccho in 2015 LVEF  55%. 4.  History of alcoholic abuse, tobacco use disorder, now drinks about 3 ounces of vodka per day and smokes about half a pack of cigarettes per day. 5.  Hypertension 6.  Hyperlipidemia 7.  Severe malnutrition with reduced serum albumin, serum BUN. 8.  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 9.  Abdominal aortic aneurysm by aortic duplex on 08/16/15 revealing 4.3 cm distal aneurysm.  Recommendation: I will  increase metoprolol XL to  50mg  p.o. daily and transition him to p.o. amiodarone.  Although severe LV systolic dysfunction, does not appear to be in acute decompensated heart failure. Hold ACEi due to low BP.   I'll discuss the findings of the CT and MRI, echocardiogram with the patient.  Guarded rubeosis in view of multiple medical comorbidities.  I'll continue to follow sidelines for management of atrial fibrillation.  He now states that he has dysphagia over the past 2 months and severe pain in him mouth.    Adrian Prows, M.D. 01/03/2017, 6:31 PM [facility] Pager: [phone] Office: [phone] If no answer: [phone]

## 2017-01-03 NOTE — Progress Notes (Signed)
PROGRESS NOTE    Tyler Holland  [id number] DOB: 04/25/1946 DOA: 12/31/2016 PCP: No PCP Per Patient    Brief Narrative:  Patient is a 71 year old gentleman history of head and neck cancer diagnosed in 2012 and treated in [place] with chemotherapy and radiation, history of prostate cancer, hypertension, atrial fibrillation/flutter presented to the ED after 2 falls at home with acute onset of uncontrollable left lower extremity tremor leading to his fall and significant weakness. In the ED patient noted to be in A. fib with RVR and borderline hypotension was initially on IV Cardizem and subsequently transitioned to IV amiodarone. 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 2-D echo done with a EF of 25-30% with global hypokinesis. EEG was negative. Concern for probable TIA and a such stroke workup underway.   Assessment & Plan:   Principal Problem:   TIA (transient ischemic attack): Probable Active Problems:   Atrial fibrillation with RVR (HCC)   ICAO (internal carotid artery occlusion), right   Dehydration   Nasopharyngeal mass   Transient left leg weakness   Ascending aortic aneurysm (HCC)   Hypomagnesemia   Cardiomyopathy (HCC)   Right carotid artery occlusion   #1 probable TIA transient left leg tremors/weakness Patient had presented with left leg tremors/weakness. Likely secondary to probable TIA in the setting of high-grade carotid lesion/carotid stenosis. Patient with no further episodes. MRI of the head with an acute occlusion of the right ICA. Patient with risk factors for TIA including A. fib, hypertension, age, history of tobacco use and not on any antiplatelet agents or anticoagulation prior to admission. EEG done was negative. MRA of the head and neck consistent with right ICA occlusion. 2-D echo with a cardiomyopathy and a EF of 25-30% without any source of emboli. Carotid Dopplers consistent with right ICA occlusive disease on my left ICA with  mild amount of plaque without significant stenosis noted. Fasting lipid panel with LDL of 59. Continue Lipitor. Patient has been changed from IV heparin to NOAC Arne Cleveland) for secondary stroke prevention per neurology. PT/OT pending. Neurology following and appreciate input and recommendations.  #2 A. fib with RVR/afultter 2:1 conduction( CHA2DS2VASC score 6) Patient noted on admission to be in A. fib with RVR and initially placed on a Cardizem drip which was discontinued secondary to hypotension and patient on amiodarone drip. TSH within normal limits. Free T4 1.00. Cardiac enzymes negative 3. 2-D echo with a EF of 25-30% with global hypokinesis with more severe inferior hypokinesis to akinesis, elevated left ventricular filling pressure, dilated aortic root 4.3 cm, mild-to-moderate MR, severe biatrial enlargement, mild TR, RVSP 32 mmHg, dilated IVC. Patient currently on amiodarone drip and opral toprol and digoxin. Cardizem drip has been discontinued. Patient has been seen in consultation by cardiology and cardiology following. Patient on eliquis secondary to probable TIA and acute occlusion of right ICA . Replete electrolytes chart a keep magnesium greater than 2 and potassium greater than 4. Cardiology following and appreciate input and recommendations.  #3 dehydration Improved with hydration. Saline lock IV fluids.  #4 postobstructive right middle ear and mastoid effusions Consulted with ENT, however per nursing note have been from Dr. Constance Holster, ENTs office had called and recommended outpatient follow-up in the office. It is noted that ENT was aware of patient's MRI results.  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 Concern for recurrent tumor versus metastatic disease. Patient with a prior history of head and neck cancer involving the left side  of his head behind his ear approximately 5 years ago, status post chemoradiation in [place], [place]. Consulted with ENT, however per nursing  note have been from Dr. Constance Holster, ENTs office had called and recommended outpatient follow-up in the office. It is noted that ENT was aware of patient's MRI results.   #6 acute complete occlusion of right ICA MRI of head and neck showing a acute appearing complete occlusion of the right ICA from bifurcation to the circle of Willis likely secondary to tumor versus related therapy. Due to concern for probable TIA as patient had presented with left leg tremors and weakness MRA of the neck has been ordered per Neurology recommendations and consistent with right ICA occlusion beginning at the bulb likely recent occlusion and likely secondary to nasopharyngeal mass invading the skull base and carotid canal. Due to concern for TIAs and atrial fibrillation IV heparin was discontinued and patient currently on NOAC, eliquis.. Avoid hypotension. Neurology following and appreciate input and recommendations.   #7 hypotension Likely secondary to hypovolemic hypotension versus cardiac etiology vs infetious etiology (unlikely as patient with no fever and normal white count). Cardiac enzymes negative 3. 2-D echo with a cardiomyopathy with a EF of 25-30%. Urinalysis nitrite negative leukocytes negative. Chest x-ray is negative for any acute infiltrate. Hypotension improved. Saline lock IV fluids. Follow.   #8 cardiomyopathy 2-D echo with a EF of 25-30% with global hypokinesis with more severe inferior hypokinesis to akinesis, elevated left ventricular filling pressure, dilated aortic root to 4.3 cm, mild-to-moderate MR, severe biatrial enlargement, mild TR, RVSP 32 mm of hematocrit, dilated IVC. Patient currently on IV heparin secondary to acute occlusion of the right ICA. Patient also on IV Cardizem and IV amiodarone for atrial fibrillation. Fasting lipid panel with LDL of 59. Hemoglobin A1c = 5.5. Continue Lipitor. Patient on oral Toprol-XL per cardiology and dose adjusted. Digoxin added to patient's regimen.. Cardiology  following and appreciate input and recommendations.  #9 ascending thoracic aneurysm measuring 4.2 cm/abdominal aortic aneurysm by duplex on 08/16/2015 revealing 4.3 cm distal aneurysm Continue blood pressure management, risk factor modification. Patient on anticoagulation. Outpatient follow-up.  #10 tobacco abuse Tobacco cessation. Place on a nicotine patch.  #11 wheezing Patient with some minimal expiratory wheezing. Patient with an extensive tobacco history. Repeat chest x-ray. Saline lock IV fluids. Place on Xopenex and Atrovent scheduled nebulizers.Dulera. Claritin, Flonase, Mucinex.     DVT prophylaxis: eliquis Code Status: DNR Family Communication: updated patient, no family present. Disposition Plan: Home vs SNF pending workup.   Consultants:   Cardiology Dr. Martinique 01/01/2017>>>> Dr. Irven Shelling patient  Neurology: Dr. Shon Hale 01/01/2017  Procedures:   CT head 12/31/2016  CT soft tissue neck 12/31/2016  MRI head 01/01/2017  MRI neck 01/01/2017  Chest x-ray 12/31/2016  Plain films of the left wrist 12/31/2016  2-D echo 01/01/2017  EEG 01/01/2017  MRA neck 01/02/2017  Carotid Dopplers 01/02/2017  Antimicrobials:   None   Subjective: Patient complaining of congestion. Patient denies any shortness of breath. Patient denies any chest pain. Patient denies any pain or tremors in his left leg.  Objective: Vitals:   01/02/17 1959 01/03/17 0347 01/03/17 0749 01/03/17 1005  BP: 111/71 108/90 122/85   Pulse:  71 (!) 140   Resp: 20 18 (!) 23   Temp: 98 F (36.7 C) 97.7 F (36.5 C) 97.9 F (36.6 C)   TempSrc: Oral Oral Oral   SpO2: 95% 95% 95% 96%  Weight:      Height:  Intake/Output Summary (Last 24 hours) at 01/03/17 1009 Last data filed at 01/03/17 0700  Gross per 24 hour  Intake          2379.39 ml  Output              825 ml  Net          1554.39 ml   Filed Weights   01/01/17 1700  Weight: 88.5 kg (195 lb)    Examination:  General  exam: Appears calm and comfortable  Respiratory system: Minimal expiratory wheezing. Respiratory effort normal. Cardiovascular system: Irregularly irregular. No JVD, murmurs, rubs, gallops or clicks. No pedal edema. Gastrointestinal system: Abdomen is nondistended, soft and nontender. No organomegaly or masses felt. Normal bowel sounds heard. Central nervous system: Alert and oriented. No focal neurological deficits. Extremities: Symmetric 5 x 5 power. Skin: No rashes, lesions or ulcers Psychiatry: Judgement and insight appear normal. Mood & affect appropriate.     Data Reviewed: I have personally reviewed following labs and imaging studies  CBC:  Recent Labs Lab 12/31/16 1441 01/01/17 0934 01/02/17 0250 01/03/17 0308  WBC 8.6 7.1 6.3 6.3  NEUTROABS  --  5.8  --   --   HGB 17.1* 15.4 14.4 14.0  HCT 50.1 45.2 43.2 42.1  MCV 100.2* 99.8 100.0 99.5  PLT 174 152 156 [id number]*   Basic Metabolic Panel:  Recent Labs Lab 12/31/16 1441 01/01/17 0934 01/02/17 1046 01/03/17 0308  NA 134* 133* 134* 134*  K 4.3 3.5 3.4* 3.6  CL 97* 101 101 101  CO2 26 23 25 26   GLUCOSE 87 156* 104* 101*  BUN 12 10 5* 7  CREATININE 0.88 0.68 0.57* 0.70  CALCIUM 9.3 8.3* 8.5* 8.5*  MG  --  1.3* 1.3* 1.7   GFR: Estimated Creatinine Clearance: 96.3 mL/min (by C-G formula based on SCr of 0.7 mg/dL). Liver Function Tests:  Recent Labs Lab 12/31/16 1441 01/01/17 0934  AST 22 21  ALT 15* 15*  ALKPHOS 75 60  BILITOT 1.3* 1.0  PROT 6.8 5.5*  ALBUMIN 3.2* 2.7*    Recent Labs Lab 12/31/16 1441  LIPASE 19   No results for input(s): AMMONIA in the last 168 hours. Coagulation Profile: No results for input(s): INR, PROTIME in the last 168 hours. Cardiac Enzymes:  Recent Labs Lab 12/31/16 1850 01/01/17 0934 01/01/17 1516 01/01/17 2119  CKTOTAL 80  --   --   --   TROPONINI 0.03* <0.03 <0.03 <0.03   BNP (last 3 results) No results for input(s): PROBNP in the last 8760  hours. HbA1C:  Recent Labs  01/01/17 1507  HGBA1C 5.5   CBG: No results for input(s): GLUCAP in the last 168 hours. Lipid Profile:  Recent Labs  01/01/17 1507  CHOL 105  HDL 37*  LDLCALC 59  TRIG 47  CHOLHDL 2.8   Thyroid Function Tests:  Recent Labs  01/01/17 0631  TSH 2.810  FREET4 1.00   Anemia Panel: No results for input(s): VITAMINB12, FOLATE, FERRITIN, TIBC, IRON, RETICCTPCT in the last 72 hours. Sepsis Labs:  Recent Labs Lab 12/31/16 1917  LATICACIDVEN 1.76    Recent Results (from the past 240 hour(s))  MRSA PCR Screening     Status: None   Collection Time: 01/01/17  5:10 PM  Result Value Ref Range Status   MRSA by PCR NEGATIVE NEGATIVE Final    Comment:        The GeneXpert MRSA Assay (FDA approved for NASAL specimens only), is one component  of a comprehensive MRSA colonization surveillance program. It is not intended to diagnose MRSA infection nor to guide or monitor treatment for MRSA infections.          Radiology Studies: Mr Jodene Nam Neck W Wo Contrast  Result Date: 01/02/2017 CLINICAL DATA:  Right carotid artery occlusion. History of head neck cancer diagnosed in 2012 with treatment in [place] including chemotherapy and radiation therapy. EXAM: MRA NECK WITHOUT AND WITH CONTRAST TECHNIQUE: Multiplanar and multiecho pulse sequences of the neck were obtained without and with intravenous contrast. Angiographic images of the neck were obtained using MRA technique without and with intravenous contrast. CONTRAST:  40mL MULTIHANCE GADOBENATE DIMEGLUMINE 529 MG/ML IV SOLN COMPARISON:  None. FINDINGS: Three vessel arch branching. Abrupt occlusion of the right internal carotid artery at its origin. The lumen is visible and distended along most of its length, compatible with an acute occlusion. Enhanced seen, necrotic appearing right nasopharyngeal mass which involves the skullbase at the level of the carotid canal. This is the favored causes of occlusion.  The patient has changes of head neck radiotherapy for cancer, and there could be contributory ICA stenosis in this setting. Faint flow in the right cavernous ICA which progressively normalizes. The right MCA and ACA vessels are likely under filled based on vessel size when compared to the left. Tiny anterior and bilateral posterior communicating artery is noted. The left carotid circulation shows ICA tortuosity without stenosis. Left dominant vertebrobasilar system. There is narrowing at both vertebral artery ostia, with flow gap on the left. No proximal subclavian stenosis. IMPRESSION: 1. Right ICA occlusion beginning at the bulb. The nonenhancing lumen is not collapsed, suggesting recent occlusion. 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 Patient has changes of head and neck radiotherapy, and obscured ICA stenosis may contribute. Right cavernous ICA reconstitution; limited circle-of-Willis collateral flow due to small communicating arteries. 2. Bilateral advanced vertebral ostial stenoses. Electronically Signed   By: Monte Fantasia M.D.   On: 01/02/2017 11:35        Scheduled Meds: . apixaban  5 mg Oral BID  . atorvastatin  20 mg Oral Daily  . digoxin  0.25 mg Oral Daily  . fluticasone  2 spray Each Nare Daily  . guaiFENesin  1,200 mg Oral BID  . ipratropium  0.5 mg Nebulization Q6H  . levalbuterol  0.63 mg Nebulization Q6H  . loratadine  10 mg Oral Daily  . magnesium sulfate 1 - 4 g bolus IVPB  4 g Intravenous Once  . mouth rinse  15 mL Mouth Rinse BID  . metoprolol succinate  25 mg Oral Daily  . mometasone-formoterol  2 puff Inhalation BID  . nicotine  21 mg Transdermal Daily  . pneumococcal 23 valent vaccine  0.5 mL Intramuscular Tomorrow-1000  . potassium chloride  40 mEq Oral Once   Continuous Infusions: . amiodarone 30 mg/hr (01/03/17 0345)  . diltiazem (CARDIZEM) infusion 5 mg/hr (01/02/17 2119)     LOS: 2 days     Time spent: [place], MD [facility] Pager [phone]  If 7PM-7AM, please contact night-coverage [email or web address] Password [email or web address] 01/03/2017, 10:09 AM

## 2017-01-03 NOTE — Care Management Note (Addendum)
Case Management Note  Patient Details  Name: Tyler Holland MRN: [id number] Date of Birth: September 28, 1946  Subjective/Objective:     Adm w tia, ef 25-30%              Action/Plan: lives at home, will be on eliquis at [place]. Have req cm sec ck on copay. Gave pt 30day free eliquis card. S/W [facility] @ [facility] # [phone]   ELIQUIS  5 MG BID 30 / 60 PILLS   COVER- YES  CO-PAY- $ 45.00  TIER- 3 DRUG  PRIOR APPROVAL- NO  PHARMACY: [facility] AND [facility]   Expected Discharge Date:                  Expected Discharge Plan:     In-House Referral:     Discharge planning Services  CM Consult, Medication Assistance  Post Acute Care Choice:    Choice offered to:     DME Arranged:    DME Agency:     HH Arranged:    HH Agency:     Status of Service:  In process, will continue to follow  If discussed at Long Length of Stay Meetings, dates discussed:    Additional Comments:await pt's progress for hhc needs.   Lacretia Leigh, RN 01/03/2017, 11:24 AM

## 2017-01-03 NOTE — Discharge Instructions (Addendum)

## 2017-01-03 NOTE — Progress Notes (Signed)
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 History is obtained directly from the patient was a good historian.  The patient reports that on 12/28/16, he experienced 2 separate episodes where he had uncontrolled shaking of his left leg. Both of these occurred while he was walking around his apartment. He developed sudden jerking of his left leg significant enough that he fell to the ground. He denies any change in level of consciousness with these episodes. He states that he was unable to get up off of the ground afterwards, though says this is because he is weak all over. He is not specifically endorsing frank weakness in his left leg with these episodes. He has not had any further episodes since 12/28/16 and denies any similar episodes before.  Of note, he does report that for the past several months, he has had intermittent episodes of numbness in the left hand. These will last up to 2 hours and then resolve. He is not aware of any activities, movements, or positions that provoke this numbness. He states that he does not take aspirin or any other antiplatelet therapy. He is not on anticoagulation.  In the emergency department, he was noted to be in A. fib with RVR. He was started on a Cardizem drip but developed some hypotension. He was then transitioned to IV amiodarone. He had imaging of the head and neck including CT and MRI. These revealed no evidence of acute ischemia within the brain. However, he has occlusion of the right internal carotid artery which was felt to be acute. He was also noted to have a mass at the right skull base concerning for metastatic disease. EEG was ordered by the admitting physician and was unremarkable.  Neurology consultation is now requested for further recommendations  regarding these episodes of shaking in his left leg.   SUBJECTIVE (INTERVAL HISTORY) His  RN is at the bedside.   .Overall he feels his condition is stable.   He is still on amiodarone and cardizem drips  OBJECTIVE Temp:  [97.7 F (36.5 C)-99 F (37.2 C)] 99 F (37.2 C) (01/26 1137) Pulse Rate:  [62-140] 126 (01/26 1137) Cardiac Rhythm: Atrial fibrillation (01/26 0700) Resp:  [18-23] 21 (01/26 1137) BP: (101-122)/(68-90) 103/74 (01/26 1137) SpO2:  [94 %-99 %] 99 % (01/26 1137) FiO2 (%):  [3 %] 3 % (01/26 1137)  CBC:   Recent Labs Lab 01/01/17 0934 01/02/17 0250 01/03/17 0308  WBC 7.1 6.3 6.3  NEUTROABS 5.8  --   --   HGB 15.4 14.4 14.0  HCT 45.2 43.2 42.1  MCV 99.8 100.0 99.5  PLT 152 156 149*    Basic Metabolic Panel:   Recent Labs Lab 01/02/17 1046 01/03/17 0308  NA 134* 134*  K 3.4* 3.6  CL 101 101  CO2 25 26  GLUCOSE 104* 101*  BUN 5* 7  CREATININE 0.57* 0.70  CALCIUM 8.5* 8.5*  MG 1.3* 1.7    Lipid Panel:     Component Value Date/Time   CHOL 105 01/01/2017 1507   TRIG 47 01/01/2017 1507   HDL 37 (L) 01/01/2017 1507   CHOLHDL 2.8 01/01/2017 1507   VLDL 9 01/01/2017 1507   LDLCALC 59 01/01/2017 1507   HgbA1c:  Lab Results  Component Value Date   HGBA1C 5.5 01/01/2017  Urine Drug Screen: No results found for: LABOPIA, COCAINSCRNUR, LABBENZ, AMPHETMU, THCU, LABBARB    IMAGING  Dg Chest 2 View 12/31/2016 Cardiomegaly with aortic atherosclerosis. No acute pulmonary disease. Bibasilar atelectasis and/or scarring.    Dg Wrist Complete Left 12/31/2016 1. No acute findings.  2. Degenerative changes first carpometacarpal joint.   Ct Head Wo Contrast 12/31/2016 No acute intracranial abnormality. Mild cerebral atrophy and chronic small vessel disease. Right mastoid effusion.    Ct Soft Tissue Neck W Contrast 12/31/2016   Postobstructive RIGHT middle ear and mastoid effusions. Bone windows now submitted.  Slight widening and irregularity of  the RIGHT bony eustachian tube concerning for tumoral invasion.  Limited assessment due to lack of prior imaging.  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  Possible skullbase invasion would be better characterized on MRI neck with contrast. 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  An incidental finding of potential clinical significance has been found. 4.2 cm ascending aortic aneurysm. Recommend annual imaging followup by CTA or MRA.    Mr Kizzie Fantasia Contrast  01/01/2017   MRI HEAD  1. No acute intracranial process identified. 2. Mild chronic microvascular ischemic disease.    MRI NECK Soft Tissue Only W Wo Contrast 1. 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 Mass involves the petrous aspect of the right temporal bone superiorly, and is intimately associated with the petrous right ICA. Subtle asymmetric dural thickening and enhancement within the adjacent left middle cranial fossa may reflect early skullbase/intracranial extension and/or reactive changes.  2. 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  3. Asymmetric prominence of the left palatine tonsil without associated enhancement, with asymmetric enhancement within the right palatine tonsil (see above discussion). Correlation with direct visualization recommended.  4. 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 Again, possible infection/osteomyelitis not excluded.    MRA neck w/wo 1. Right ICA occlusion beginning at the bulb. The nonenhancing lumen is not collapsed, suggesting recent occlusion. 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 Patient has changes of head and neck radiotherapy, and obscured ICA stenosis may contribute. Right cavernous ICA reconstitution; limited circle-of-Willis collateral flow due to small communicating arteries. 2. Bilateral advanced vertebral ostial stenoses. EEG 01/01/2017 Description: The patient is awake and drowsy during the recording.  During maximal wakefulness, there is a symmetric, medium voltage 8 Hz posterior dominant rhythm that attenuates with eye opening.  The record is symmetric.  During drowsiness, there is an increase in theta slowing of the background.  Vertex waves and symmetric sleep spindles were not seen.  There were no epileptiform discharges or electrographic seizures seen.   EKG lead was unremarkable. Impression: This awake and drowsy EEG is normal.   Clinical Correlation: A normal EEG does not exclude a clinical diagnosis of epilepsy.  If further clinical questions remain, prolonged EEG may be helpful.  Clinical correlation is advised.  2DEcho : LVEF 25-30%, normal wall thickness and cavity size, global  hypokinesis with more severe inferior hypokinesis to akinesis,   elevated LV filling pressure, dilated aortic root to 4.3 cm, mild   to moderate MR, severe biatrial enlargement, mild TR  PHYSICAL EXAM Pleasant elderly Caucasian male not in distress.on nasal canula oxygen. . Afebrile. Head is nontraumatic. Neck is supple without bruit.    Cardiac exam no murmur or gallop. Lungs are clear to auscultation. Distal pulses are well felt.  Neurological Exam ;  Awake  Alert oriented x 3. Normal speech and language.eye movements full without nystagmus.fundi were not visualized.  Vision acuity and fields appear normal. Hearing is normal. Palatal movements are normal. Face symmetric. Tongue midline. Normal strength, tone, reflexes and coordination. Normal sensation. Gait is cautious and using a walker but not ataxic     ASSESSMENT/PLAN Mr. Taeshawn Brandenberger is a 71 y.o. 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  He did not receive IV t-PA due to late presentation.  Possible TIA vs seizure:  Non-dominant - embolic secondary to atrial fibrillation.  Resultant  no focal deficits  MRI - No acute intracranial process identified. MRA Neck - 1. Right ICA occlusion beginning at the bulb. The nonenhancing lumen is not collapsed, suggesting recent occlusion. 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 Patient has changes of head and neck radiotherapy, and obscured ICA stenosis may contribute. Right cavernous ICA reconstitution; limited circle-of-Willis collateral flow due to small communicating arteries. 2. Bilateral advanced vertebral ostial stenoses.    EEG - normal  Carotid Doppler - in progress - unable to cancel 2D Echo - LVEF 25-30%, normal wall thickness and cavity size, global   hypokinesis with more severe inferior hypokinesis to akinesis,   elevated LV filling pressure, dilated aortic root to 4.3 cm, mild   to moderate MR, severe biatrial enlargement, mild TR, RVSP 32    mmHg, dilated IVC.  LDL - 59  HgbA1c - pending  VTE prophylaxis - IV heparin and SCDs DIET - DYS 1 Room service appropriate? Yes; Fluid consistency: Thin  No antithrombotic prior to admission, now on heparin IV  Patient counseled to be compliant with his antithrombotic medications  Ongoing aggressive stroke risk  factor management  Therapy recommendations: pending  Disposition: Pending  Hypertension  Somewhat low blood pressures  Permissive hypertension (OK if < 220/120) but gradually normalize in 5-7 days  Long-term BP goal normotensive  Hyperlipidemia  Home meds:  Lipitor 20 mg daily resumed in hospital  LDL 59, goal < 70  Continue statin at discharge    Other Stroke Risk Factors  Advanced age  Cigarette smoker - advised to stop smoking  ETOH use, advised to drink no more than 1 - 2 drinks per day  Overweight, Body mass index is 27.98 kg/m., recommend weight loss, diet and exercise as appropriate   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  Atrial fibrillation   Other Active Problems  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 He presented with 2 transient episodes of left lower extremity shaking in the setting of what appears to be acute right carotid occlusion likely limb shaking TIA. MRI shows no acute infarct in ED shows no definite seizure activity. Patient also has new-onset atrial fibrillation hence recommend anticoagulation with IV heparin and switched to eliquis. ENT/oncology evaluation for skull base metastasis and tumor avoid hypotension and keep systolic blood pressure greater than 120 .discuss with Dr. Grandville Silos. Greater than 50% time during this 25 minute visit was spent on counseling and coordination of care about TIA,  carotid occlusion, AFIB and answering questions . Stroke team will sign off. Follow-up as an outpatient in the stroke clinic in 6 weeks. Kindly call for questions  Antony Contras, MD Medical Director [place] Pager: [phone] 01/03/2017 1:08 PM   To contact Stroke Continuity provider, please refer to [email or web address] After hours, contact General Neurology

## 2017-01-04 DIAGNOSIS — R1310 Dysphagia, unspecified: Secondary | ICD-10-CM

## 2017-01-04 LAB — CBC
HEMATOCRIT: 41.8 % (ref 39.0–52.0)
Hemoglobin: 14.3 g/dL (ref 13.0–17.0)
MCH: 33.9 pg (ref 26.0–34.0)
MCHC: 34.2 g/dL (ref 30.0–36.0)
MCV: 99.1 fL (ref 78.0–100.0)
Platelets: 152 10*3/uL (ref 150–400)
RBC: 4.22 MIL/uL (ref 4.22–5.81)
RDW: 12.8 % (ref 11.5–15.5)
WBC: 6.7 10*3/uL (ref 4.0–10.5)

## 2017-01-04 LAB — BASIC METABOLIC PANEL
Anion gap: 7 (ref 5–15)
BUN: 6 mg/dL (ref 6–20)
CALCIUM: 8.5 mg/dL — AB (ref 8.9–10.3)
CO2: 27 mmol/L (ref 22–32)
CREATININE: 0.64 mg/dL (ref 0.61–1.24)
Chloride: 99 mmol/L — ABNORMAL LOW (ref 101–111)
GFR calc non Af Amer: >60 mL/min (ref >60)
GLUCOSE: 98 mg/dL (ref 65–99)
Potassium: 3.5 mmol/L (ref 3.5–5.1)
Sodium: 133 mmol/L — ABNORMAL LOW (ref 135–145)

## 2017-01-04 LAB — HEMOGLOBIN A1C
Hgb A1c MFr Bld: 5.7 % — ABNORMAL HIGH (ref 4.8–5.6)
Mean Plasma Glucose: 117 mg/dL

## 2017-01-04 LAB — MAGNESIUM: MAGNESIUM: 1.5 mg/dL — AB (ref 1.7–2.4)

## 2017-01-04 MED ORDER — IPRATROPIUM BROMIDE 0.02 % IN SOLN
0.5000 mg | Freq: Two times a day (BID) | RESPIRATORY_TRACT | Status: DC
  Administered 2017-01-04 – 2017-01-05 (×4): 0.5 mg via RESPIRATORY_TRACT
  Filled 2017-01-04 (×4): qty 2.5

## 2017-01-04 MED ORDER — MAGNESIUM OXIDE 400 (241.3 MG) MG PO TABS
400.0000 mg | ORAL_TABLET | Freq: Two times a day (BID) | ORAL | Status: DC
  Administered 2017-01-04 – 2017-01-08 (×9): 400 mg via ORAL
  Filled 2017-01-04 (×9): qty 1

## 2017-01-04 MED ORDER — LEVALBUTEROL HCL 0.63 MG/3ML IN NEBU
0.6300 mg | INHALATION_SOLUTION | Freq: Two times a day (BID) | RESPIRATORY_TRACT | Status: DC
  Administered 2017-01-04 – 2017-01-05 (×4): 0.63 mg via RESPIRATORY_TRACT
  Filled 2017-01-04 (×4): qty 3

## 2017-01-04 MED ORDER — FUROSEMIDE 10 MG/ML IJ SOLN: 20.0000 mg | Freq: Once | INTRAMUSCULAR | Status: DC

## 2017-01-04 MED ORDER — FUROSEMIDE 10 MG/ML IJ SOLN: 40.0000 mg | Freq: Once | INTRAMUSCULAR | Status: DC

## 2017-01-04 MED ORDER — POTASSIUM CHLORIDE CRYS ER 20 MEQ PO TBCR
40.0000 meq | EXTENDED_RELEASE_TABLET | Freq: Once | ORAL | Status: AC
  Administered 2017-01-04: 40 meq via ORAL
  Filled 2017-01-04: qty 2

## 2017-01-04 MED ORDER — FUROSEMIDE 10 MG/ML IJ SOLN
20.0000 mg | Freq: Once | INTRAMUSCULAR | Status: AC
  Administered 2017-01-04: 20 mg via INTRAVENOUS
  Filled 2017-01-04: qty 2

## 2017-01-04 MED ORDER — MAGNESIUM SULFATE 4 GM/100ML IV SOLN
4.0000 g | Freq: Once | INTRAVENOUS | Status: AC
  Administered 2017-01-04: 4 g via INTRAVENOUS
  Filled 2017-01-04: qty 100

## 2017-01-04 NOTE — Progress Notes (Signed)
PROGRESS NOTE    Tyler Holland  [id number] DOB: 03/19/1946 DOA: 12/31/2016 PCP: No PCP Per Patient    Brief Narrative:  Patient is a 71 year old gentleman history of head and neck cancer diagnosed in 2012 and treated in [place] with chemotherapy and radiation, history of prostate cancer, hypertension, atrial fibrillation/flutter presented to the ED after 2 falls at home with acute onset of uncontrollable left lower extremity tremor leading to his fall and significant weakness. In the ED patient noted to be in A. fib with RVR and borderline hypotension was initially on IV Cardizem and subsequently transitioned to IV amiodarone. 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 2-D echo done with a EF of 25-30% with global hypokinesis. EEG was negative. Concern for probable TIA and a such stroke workup underway.   Assessment & Plan:   Principal Problem:   TIA (transient ischemic attack): Probable Active Problems:   Atrial fibrillation with RVR (HCC)   ICAO (internal carotid artery occlusion), right   Dehydration   Nasopharyngeal mass   Transient left leg weakness   Ascending aortic aneurysm (HCC)   Hypomagnesemia   Cardiomyopathy ([place])   Right carotid artery occlusion   Head and neck cancer (HCC)   Dysphagia   #1 probable TIA transient left leg tremors/weakness Patient had presented with left leg tremors/weakness. Likely secondary to probable TIA in the setting of high-grade carotid lesion/carotid stenosis. Patient with no further episodes. MRI of the head with an acute occlusion of the right ICA. Patient with risk factors for TIA including A. fib, hypertension, age, history of tobacco use and not on any antiplatelet agents or anticoagulation prior to admission. EEG done was negative. MRA of the head and neck consistent with right ICA occlusion. 2-D echo with a cardiomyopathy and a EF of 25-30% without any source of emboli. Carotid Dopplers consistent with right  ICA occlusive disease on my left ICA with mild amount of plaque without significant stenosis noted. Fasting lipid panel with LDL of 59. Continue Lipitor. Patient has been changed from IV heparin to NOAC Arne [place]) for secondary stroke prevention per neurology. PT/OT pending. Neurology following and appreciate input and recommendations.  #2 A. fib with RVR/afultter 2:1 conduction( CHA2DS2VASC score 6) Patient noted on admission to be in A. fib with RVR and initially placed on a Cardizem drip which was discontinued secondary to hypotension and patient on amiodarone drip. TSH within normal limits. Free T4 1.00. Cardiac enzymes negative 3. 2-D echo with a EF of 25-30% with global hypokinesis with more severe inferior hypokinesis to akinesis, elevated left ventricular filling pressure, dilated aortic root 4.3 cm, mild-to-moderate MR, severe biatrial enlargement, mild TR, RVSP 32 mmHg, dilated IVC. Patient currently on amiodarone drip and opral toprol and digoxin. Cardizem drip has been discontinued. Patient has been seen in consultation by cardiology and cardiology following. Patient on eliquis secondary to probable TIA and acute occlusion of right ICA . Replete electrolytes and keep magnesium greater than 2 and potassium greater than 4. Cardiology following and appreciate input and recommendations.  #3 dehydration Improved with hydration. Saline lock IV fluids.  #4 postobstructive right middle ear and mastoid effusions Consulted with ENT, however per nursing note have been from Dr. Constance Holster, ENTs office had called and recommended outpatient follow-up in the office. It is noted that ENT was aware of patient's MRI results.  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 Concern for recurrent tumor versus metastatic disease. Patient with a prior history  of head and neck cancer involving the left side of his head behind his ear approximately 5 years ago, status post chemoradiation in [place], [place].  Consulted with ENT, however per nursing note have been from Dr. Constance Holster, ENTs office had called and recommended outpatient follow-up in the office. It is noted that ENT was aware of patient's MRI results.   #6 acute complete occlusion of right ICA MRI of head and neck showing a acute appearing complete occlusion of the right ICA from bifurcation to the circle of Willis likely secondary to tumor versus related therapy. Due to concern for probable TIA as patient had presented with left leg tremors and weakness MRA of the neck has been ordered per Neurology recommendations and consistent with right ICA occlusion beginning at the bulb likely recent occlusion and likely secondary to nasopharyngeal mass invading the skull base and carotid canal. Due to concern for TIAs and atrial fibrillation IV heparin was discontinued and patient currently on NOAC, eliquis.. Avoid hypotension. Neurology following and appreciate input and recommendations.   #7 hypotension Likely secondary to hypovolemic hypotension versus cardiac etiology vs infetious etiology (unlikely as patient with no fever and normal white count). Cardiac enzymes negative 3. 2-D echo with a cardiomyopathy with a EF of 25-30%. Urinalysis nitrite negative leukocytes negative. Chest x-ray is negative for any acute infiltrate. Hypotension improved. Saline lock IV fluids. Follow.   #8 cardiomyopathy 2-D echo with a EF of 25-30% with global hypokinesis with more severe inferior hypokinesis to akinesis, elevated left ventricular filling pressure, dilated aortic root to 4.3 cm, mild-to-moderate MR, severe biatrial enlargement, mild TR, RVSP 32 mm of hematocrit, dilated IVC. Patient was on IV heparin secondary to acute occlusion of the right ICA. Patient was also on IV Cardizem and IV amiodarone for atrial fibrillation. Fasting lipid panel with LDL of 59. Hemoglobin A1c = 5.5. Continue Lipitor. Patient on oral Toprol-XL per cardiology and dose adjusted. Digoxin added  to patient's regimen. IV amiodarone has been transitioned to oral amiodarone per cardiology. Cardiology following and appreciate input and recommendations.  #9 ascending thoracic aneurysm measuring 4.2 cm/abdominal aortic aneurysm by duplex on 08/16/2015 revealing 4.3 cm distal aneurysm Continue blood pressure management, risk factor modification. Patient on anticoagulation. Outpatient follow-up.  #10 tobacco abuse Tobacco cessation. Continue nicotine patch.  #11 wheezing Patient with some minimal expiratory wheezing yesterday which has since improved. Patient with an extensive tobacco history. Repeat chest x-ray with mild increase of interstitial markings and patchy airspace consultation right greater than left which may be pneumonia or asymmetric interstitial pulmonary edema. Patient is afebrile. Patient with no significant respiratory symptoms. Patient does not have a leukocytosis. Highly doubt if this is a clinical pneumonia. WIll give a dose of IV Lasix 20 mg 1. Continue Xopenex and Atrovent scheduled nebulizers.Dulera, Claritin, Flonase, Mucinex.  #12 dysphagia Patient currently on a dysphagia 1 diet. Will have speech therapy reassess.  #13 mouth pain Patient has been evaluated by dentistry, Dr. Enrique Sack on 11/25/2016 at that time is noted that patient does have a history of acute pulpitis symptoms, chronic apical periodontitis, dental caries, multiple retained roots segments, chronic periodontitis with bone loss, tooth mobility, gingival recession, multiple missing teeth. Options were presented to the patient by dentistry at the time however patient refused the options and stated he will contact dental medicine when he was ready to proceed with any kind of dental treatment. Viscous lidocaine as needed. Outpatient follow-up.     DVT prophylaxis: eliquis Code Status: DNR Family Communication: updated patient  and nephew at bedside. Disposition Plan: SNF once heart rate is controlled for  his atrial fibrillation and per cardiology.    Consultants:   Cardiology Dr. Martinique 01/01/2017>>>> Dr. Irven Shelling patient  Neurology: Dr. Shon Hale 01/01/2017  Procedures:   CT head 12/31/2016  CT soft tissue neck 12/31/2016  MRI head 01/01/2017  MRI neck 01/01/2017  Chest x-ray 12/31/2016  Plain films of the left wrist 12/31/2016  2-D echo 01/01/2017  EEG 01/01/2017  MRA neck 01/02/2017  Carotid Dopplers 01/02/2017  Antimicrobials:   None   Subjective: Patient complaining of congestion. Patient denies any shortness of breath. Patient denies any chest pain. Patient states tried to ambulate to the bathroom and did have some tremors in his leg. Patient had complained to cardiology of dysphagia and mouth pain.  Objective: Vitals:   01/04/17 0814 01/04/17 1000 01/04/17 1147 01/04/17 1432  BP: 125/80   111/67  Pulse: 97  (!) 117 (!) 102  Resp: (!) 22     Temp: 98.2 F (36.8 C)  99.2 F (37.3 C) 98.2 F (36.8 C)  TempSrc: Axillary  Oral Oral  SpO2: 97%     Weight:  88.3 kg (194 lb 10.7 oz)    Height:        Intake/Output Summary (Last 24 hours) at 01/04/17 1535 Last data filed at 01/04/17 1435  Gross per 24 hour  Intake            568.5 ml  Output             2700 ml  Net          -2131.5 ml   Filed Weights   01/01/17 1700 01/04/17 1000  Weight: 88.5 kg (195 lb) 88.3 kg (194 lb 10.7 oz)    Examination:  General exam: Appears calm and comfortable  Respiratory system: Some scattered coarse breath sounds. No wheezing. Respiratory effort normal. Cardiovascular system: Irregularly irregular. No JVD, murmurs, rubs, gallops or clicks. No pedal edema. Gastrointestinal system: Abdomen is nondistended, soft and nontender. No organomegaly or masses felt. Normal bowel sounds heard. Central nervous system: Alert and oriented. No focal neurological deficits. Extremities: Symmetric 5 x 5 power. Skin: No rashes, lesions or ulcers Psychiatry: Judgement and insight appear  normal. Mood & affect appropriate.     Data Reviewed: I have personally reviewed following labs and imaging studies  CBC:  Recent Labs Lab 12/31/16 1441 01/01/17 0934 01/02/17 0250 01/03/17 0308 01/04/17 0520  WBC 8.6 7.1 6.3 6.3 6.7  NEUTROABS  --  5.8  --   --   --   HGB 17.1* 15.4 14.4 14.0 14.3  HCT 50.1 45.2 43.2 42.1 41.8  MCV 100.2* 99.8 100.0 99.5 99.1  PLT 174 152 156 149* 0000000   Basic Metabolic Panel:  Recent Labs Lab 12/31/16 1441 01/01/17 0934 01/02/17 1046 01/03/17 0308 01/04/17 0520  NA 134* 133* 134* 134* 133*  K 4.3 3.5 3.4* 3.6 3.5  CL 97* 101 101 101 99*  CO2 26 23 25 26 27   GLUCOSE 87 156* 104* 101* 98  BUN 12 10 5* 7 6  CREATININE 0.88 0.68 0.57* 0.70 0.64  CALCIUM 9.3 8.3* 8.5* 8.5* 8.5*  MG  --  1.3* 1.3* 1.7 1.5*   GFR: Estimated Creatinine Clearance: 96.1 mL/min (by C-G formula based on SCr of 0.64 mg/dL). Liver Function Tests:  Recent Labs Lab 12/31/16 1441 01/01/17 0934  AST 22 21  ALT 15* 15*  ALKPHOS 75 60  BILITOT 1.3* 1.0  PROT  6.8 5.5*  ALBUMIN 3.2* 2.7*    Recent Labs Lab 12/31/16 1441  LIPASE 19   No results for input(s): AMMONIA in the last 168 hours. Coagulation Profile: No results for input(s): INR, PROTIME in the last 168 hours. Cardiac Enzymes:  Recent Labs Lab 12/31/16 1850 01/01/17 0934 01/01/17 1516 01/01/17 2119  CKTOTAL 80  --   --   --   TROPONINI 0.03* <0.03 <0.03 <0.03   BNP (last 3 results) No results for input(s): PROBNP in the last 8760 hours. HbA1C:  Recent Labs  01/03/17 0308  HGBA1C 5.7*   CBG: No results for input(s): GLUCAP in the last 168 hours. Lipid Profile: No results for input(s): CHOL, HDL, LDLCALC, TRIG, CHOLHDL, LDLDIRECT in the last 72 hours. Thyroid Function Tests: No results for input(s): TSH, T4TOTAL, FREET4, T3FREE, THYROIDAB in the last 72 hours. Anemia Panel: No results for input(s): VITAMINB12, FOLATE, FERRITIN, TIBC, IRON, RETICCTPCT in the last 72  hours. Sepsis Labs:  Recent Labs Lab 12/31/16 1917  LATICACIDVEN 1.76    Recent Results (from the past 240 hour(s))  MRSA PCR Screening     Status: None   Collection Time: 01/01/17  5:10 PM  Result Value Ref Range Status   MRSA by PCR NEGATIVE NEGATIVE Final    Comment:        The GeneXpert MRSA Assay (FDA approved for NASAL specimens only), is one component of a comprehensive MRSA colonization surveillance program. It is not intended to diagnose MRSA infection nor to guide or monitor treatment for MRSA infections.          Radiology Studies: Dg Chest Port 1 View  Result Date: 01/03/2017 CLINICAL DATA:  Chest congestion.  Shortness of breath. EXAM: PORTABLE CHEST 1 VIEW COMPARISON:  12/31/2016 FINDINGS: The cardiac silhouette is stably enlarged. Mediastinal contours appear intact. There is no evidence of pneumothorax. There are mildly increased interstitial markings. Patchy airspace consolidation is seen in the right more than left lower lobes. There is probable left lower lobe atelectasis. Small subpulmonic effusions cannot be excluded. Osseous structures are without acute abnormality. Soft tissues are grossly normal. IMPRESSION: Interval development of mild increase of the interstitial markings and patchy airspace consolidation in right more than left lower lobe. This may represent multifocal pneumonia or asymmetric interstitial pulmonary edema. Electronically Signed   By: Fidela Salisbury M.D.   On: 01/03/2017 13:17        Scheduled Meds: . amiodarone  200 mg Oral Daily  . apixaban  5 mg Oral BID  . atorvastatin  20 mg Oral Daily  . digoxin  0.25 mg Oral Daily  . fluticasone  2 spray Each Nare Daily  . guaiFENesin  1,200 mg Oral BID  . ipratropium  0.5 mg Nebulization BID  . levalbuterol  0.63 mg Nebulization BID  . loratadine  10 mg Oral Daily  . magnesium oxide  400 mg Oral BID  . mouth rinse  15 mL Mouth Rinse BID  . metoprolol succinate  50 mg Oral Daily   . mometasone-formoterol  2 puff Inhalation BID  . nicotine  21 mg Transdermal Daily   Continuous Infusions:    LOS: 3 days    Time spent: [place], MD [facility] Pager [phone]  If 7PM-7AM, please contact night-coverage [email or web address] Password [email or web address] 01/04/2017, 3:35 PM

## 2017-01-04 NOTE — Evaluation (Signed)
Clinical/Bedside Swallow Evaluation Patient Details  Name: Tyler Holland MRN: [id number] Date of Birth: 01-17-1946  Today's Date: 01/04/2017 Time: SLP Start Time (ACUTE ONLY): 1315 SLP Stop Time (ACUTE ONLY): 1335 SLP Time Calculation (min) (ACUTE ONLY): 20 min  Past Medical History:  Past Medical History:  Diagnosis Date  . A-fib ([place])   . Atrial flutter ([place])   . Cancer ([place])    behind L ear  . Hypertension   . Prostate cancer ([place]) 2002  . Status post radiation 09/2011   Past Surgical History:  Past Surgical History:  Procedure Laterality Date  . PROSTATECTOMY  2002   HPI:  71 y.o. gentleman with a history of head and neck cancer (diagnosed in 2012, treated in [place] with chemotherapy and radiation, details in dental clinic visit progress note from 11/25/16 by Dr. Enrique Sack), prostate cancer, HTN, and atrial fibrillation/flutter (CHADS-Vasc score of 3, he is not anticoagulated) who presents to the ED on 1/24/18for evaluation after having two falls at home on Saturday. The patient reports that he has had two distinct episodes of acute onset, involuntary, uncontrollable left leg tremor that ultimately causes him to fall. Both times, he has had increased difficulty getting back on his feet, reporting that it takes him 30 minutes to "recover". He denies LOC or associated confusion/post-ictal state. Likely secondary to probable TIA in the setting of high-grade carotid lesion/carotid stenosis. 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 States that he has dysphagia over the past 2 months and severe pain in him mouth. CXR 1/26: Interval development of mild increase of the  interstitial markings and patchy airspace consolidation in right more than left lower lobe. This may represent multifocal pneumonia or asymmetric interstitial pulmonary edema. Note that patient seen by Dentist on 12/18 who noted Oral mass involving lower right quadrant. Patient given options including possible biopsy but has not made a decision to proceed.    Assessment / Plan / Recommendation Clinical Impression  Bedside swallow evaluation complete. Patient with a moderate oral dysphagia secondary to severe tooth sensitivity and oral pain. Recent dental notes indicate findings of a possible oral mass involving a lower right quadrant. Hypernasality noted, likely a result of possibly reoccurring nasopharyngeal mass, but otherwise, patient without evidence of a pharyngeal dysphagia with full appearing airway protection. CXR results indicating posible PNA (right greater than left) does raise concern for aspiration. Patient reports only occassional coughing while consuming pos. In light of CXR results and h/o radiation treatment with possible impact on pharyngeal swallow, SLP will f/u briefly for diagnostic treatment. Current diet remains appropriate.     Aspiration Risk  Mild aspiration risk    Diet Recommendation Dysphagia 1 (Puree);Thin liquid   Liquid Administration via: Cup;Straw Medication Administration: Whole meds with liquid Supervision: Patient able to self feed Compensations: Slow rate;Small sips/bites Postural Changes: Seated upright at 90 degrees    Other  Recommendations Oral Care Recommendations: Oral care BID   Follow up Recommendations None      Frequency and Duration min 2x/week  1 week           Swallow Study   General HPI: 71 y.o. gentleman with a history of head and neck cancer (diagnosed in 2012, treated in [place] with  chemotherapy and radiation, details in dental clinic visit progress note from 11/25/16 by Dr. Enrique Sack), prostate cancer, HTN, and atrial  fibrillation/flutter (CHADS-Vasc score of 3, he is not anticoagulated) who presents to the ED on 1/24/18for evaluation after having two falls at home on Saturday. The patient reports that he has had two distinct episodes of acute onset, involuntary, uncontrollable left leg tremor that ultimately causes him to fall. Both times, he has had increased difficulty getting back on his feet, reporting that it takes him 30 minutes to "recover". He denies LOC or associated confusion/post-ictal state. Likely secondary to probable TIA in the setting of high-grade carotid lesion/carotid stenosis. 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 States that he has dysphagia over the past 2 months and severe pain in him mouth. CXR 1/26: Interval development of mild increase of the interstitial markings and patchy airspace consolidation in right more than left lower lobe. This may represent multifocal pneumonia or asymmetric interstitial pulmonary edema. Note that patient seen by Dentist on 12/18 who noted Oral mass involving lower right quadrant. Patient given options including possible biopsy but has not made a decision to proceed.  Type of Study: Bedside Swallow Evaluation Previous Swallow Assessment: none Diet Prior to this Study: Thin liquids;Dysphagia 1 (puree) Temperature Spikes Noted: No Respiratory Status: Nasal cannula History of Recent Intubation: No Behavior/Cognition: Alert;Cooperative;Pleasant mood Oral Cavity Assessment: Dry Oral Care Completed by SLP: Yes Oral Cavity - Dentition: Missing dentition;Poor condition Vision: Functional for self-feeding Self-Feeding Abilities: Able to feed  self Patient Positioning: Upright in bed Baseline Vocal Quality: Normal Volitional Cough: Strong Volitional Swallow: Able to elicit    Oral/Motor/Sensory Function Overall Oral Motor/Sensory Function: Generalized oral weakness (limited lingual ROM due to pain)   Ice Chips Ice chips: Not tested   Thin Liquid Thin Liquid: Within functional limits Presentation: Straw;Self Fed    Nectar Thick Nectar Thick Liquid: Not tested   Honey Thick Honey Thick Liquid: Not tested   Puree Puree: Impaired Presentation: Spoon;Self Fed Oral Phase Functional Implications: Prolonged oral transit   Solid   Jessy Cybulski MA, CCC-SLP [phone]    Solid: Not tested        Myan Locatelli Meryl 01/04/2017,2:02 PM

## 2017-01-05 DIAGNOSIS — R0989 Other specified symptoms and signs involving the circulatory and respiratory systems: Secondary | ICD-10-CM

## 2017-01-05 LAB — BASIC METABOLIC PANEL
Anion gap: 11 (ref 5–15)
BUN: 10 mg/dL (ref 6–20)
CALCIUM: 8.5 mg/dL — AB (ref 8.9–10.3)
CO2: 25 mmol/L (ref 22–32)
CREATININE: 0.66 mg/dL (ref 0.61–1.24)
Chloride: 97 mmol/L — ABNORMAL LOW (ref 101–111)
Glucose, Bld: 104 mg/dL — ABNORMAL HIGH (ref 65–99)
Potassium: 3.5 mmol/L (ref 3.5–5.1)
SODIUM: 133 mmol/L — AB (ref 135–145)

## 2017-01-05 LAB — CBC
HCT: 43.9 % (ref 39.0–52.0)
Hemoglobin: 14.8 g/dL (ref 13.0–17.0)
MCH: 33.4 pg (ref 26.0–34.0)
MCHC: 33.7 g/dL (ref 30.0–36.0)
MCV: 99.1 fL (ref 78.0–100.0)
PLATELETS: 175 10*3/uL (ref 150–400)
RBC: 4.43 MIL/uL (ref 4.22–5.81)
RDW: 12.7 % (ref 11.5–15.5)
WBC: 6.7 10*3/uL (ref 4.0–10.5)

## 2017-01-05 LAB — MAGNESIUM: Magnesium: 1.6 mg/dL — ABNORMAL LOW (ref 1.7–2.4)

## 2017-01-05 MED ORDER — AMIODARONE HCL 200 MG PO TABS
400.0000 mg | ORAL_TABLET | Freq: Three times a day (TID) | ORAL | Status: DC
  Administered 2017-01-05 – 2017-01-06 (×5): 400 mg via ORAL
  Filled 2017-01-05 (×5): qty 2

## 2017-01-05 MED ORDER — MAGNESIUM SULFATE 4 GM/100ML IV SOLN
4.0000 g | Freq: Once | INTRAVENOUS | Status: AC
  Administered 2017-01-05: 4 g via INTRAVENOUS
  Filled 2017-01-05: qty 100

## 2017-01-05 MED ORDER — DIPHENHYDRAMINE HCL 25 MG PO CAPS: 25.0000 mg | ORAL_CAPSULE | Freq: Three times a day (TID) | ORAL | Status: DC | PRN

## 2017-01-05 MED ORDER — SPIRONOLACTONE 25 MG PO TABS
25.0000 mg | ORAL_TABLET | Freq: Every day | ORAL | Status: DC
  Administered 2017-01-05 – 2017-01-08 (×3): 25 mg via ORAL
  Filled 2017-01-05 (×3): qty 1

## 2017-01-05 MED ORDER — POTASSIUM CHLORIDE CRYS ER 20 MEQ PO TBCR
40.0000 meq | EXTENDED_RELEASE_TABLET | Freq: Once | ORAL | Status: AC
  Administered 2017-01-05: 40 meq via ORAL
  Filled 2017-01-05: qty 2

## 2017-01-05 NOTE — Progress Notes (Signed)
Report called to [facility] RN.  Nephew already informed of transfer by prior RN.  Vs stable.  Transfer per bed.  Saunders Revel T

## 2017-01-05 NOTE — Progress Notes (Deleted)
Patient complained of itching and requested for benadryl, ordered d/c at this time. MD paged to notify.

## 2017-01-05 NOTE — Progress Notes (Addendum)
Subjective:  Patient is feeling better. Received IV Lasix last night with excellent urine output. Breathing seemed to have improved. Oral hygiene improved.  Objective:  Vital Signs in the last 24 hours: Temp:  [98.1 F (36.7 C)-99.9 F (37.7 C)] 98.1 F (36.7 C) (01/28 1230) Pulse Rate:  [63-122] 122 (01/28 1230) Resp:  [13-26] 25 (01/28 1230) BP: (96-164)/(67-139) 99/83 (01/28 1230) SpO2:  [93 %-98 %] 96 % (01/28 1230) Weight:  [87 kg (191 lb 14.4 oz)] 87 kg (191 lb 14.4 oz) (01/28 0340)  Intake/Output from previous day: 01/27 0701 - 01/28 0700 In: 42 [P.O.:480; IV Piggyback:100] Out: 2575 [Urine:2575]  Physical Exam: General appearance: alert, cooperative, appears older than stated age, fatigued and no distress. Ill appearing Eyes: negative findings: lids and lashes normal and conjunctivae and sclerae normal Neck: no carotid bruit, no JVD, thyroid not enlarged, symmetric, no tenderness/mass/nodules and Hard skin of the neck from prior RT and ? mass submandibular region right. Halitosis present  Neck: JVP - normal, carotids without bruits Resp: clear to auscultation bilaterally Chest wall: no tenderness Cardio: Tachycardia present.  Irregular, S1 variable, S2 normal, no murmur, click, rub or gallop and tahycardia present GI: soft, non-tender; bowel sounds normal; no masses,  no organomegaly Extremities: extremities normal, atraumatic, no cyanosis or edema Lab Results: BMP  Recent Labs  01/03/17 0308 01/04/17 0520 01/05/17 0207  NA 134* 133* 133*  K 3.6 3.5 3.5  CL 101 99* 97*  CO2 26 27 25   GLUCOSE 101* 98 104*  BUN 7 6 10   CREATININE 0.70 0.64 0.66  CALCIUM 8.5* 8.5* 8.5*  GFRNONAA >60 >60 >60  GFRAA >60 >60 >60    CBC  Recent Labs Lab 01/01/17 0934  01/05/17 0207  WBC 7.1  < > 6.7  RBC 4.53  < > 4.43  HGB 15.4  < > 14.8  HCT 45.2  < > 43.9  PLT 152  < > 175  MCV 99.8  < > 99.1  MCH 34.0  < > 33.4  MCHC 34.1  < > 33.7  RDW 12.8  < > 12.7  LYMPHSABS  0.8  --   --   MONOABS 0.5  --   --   EOSABS 0.1  --   --   BASOSABS 0.0  --   --   < > = values in this interval not displayed.  HEMOGLOBIN A1C Lab Results  Component Value Date   HGBA1C 5.7 (H) 01/03/2017   MPG 117 01/03/2017    Recent Labs  12/31/16 1850 01/01/17 0934 01/01/17 1516 01/01/17 2119  CKTOTAL 80  --   --   --   TROPONINI 0.03* <0.03 <0.03 <0.03    Recent Labs  01/01/17 0631  TSH 2.810    CHOLESTEROL  Recent Labs  01/01/17 1507  CHOL 105    Hepatic Function Panel  Recent Labs  12/31/16 1441 01/01/17 0934  PROT 6.8 5.5*  ALBUMIN 3.2* 2.7*  AST 22 21  ALT 15* 15*  ALKPHOS 75 60  BILITOT 1.3* 1.0    Imaging: Imaging results have been reviewed  Cardiac Studies: EKG 01/01/2017: Atrial flutter with 2:1 conduction at the rate of 135 bpm.  No evidence of ischemia.  Normal QT interval.  Tele 01/03/16: A. Fibrillation with RVR, HR better controlled.  Echocardiogram 01/02/2016: Normal LV size, marked global hypokinesis, severe LV systolic dysfunction, EF [id number] with grade 2 diastolic dysfunction.  Mild aortic root dilatation at 43 mm.  Mild to moderate MR.  Mild RV dilatation.  Moderate to back percent.  Moderately reduced RV systolic function.  Mild pulmonary hypertension.  PA pressure 32 mmHg.  IVC dilated with blunted respiratory response.  Echocardiogram 04/2014(not my office, but outside) : Left ventricular size is mildly increased.  Wall thickness is mildly increased.  LVEF 50-55%.  Borderline global hypokinesis.  Mitral annulus is mildly calcified.  Leaflets are mildly thickened.  Systolic bowing without prolapse.  Mild, eccentric regurgitation.  Left atrium mildly dilated.  Carotid artery duplex 01/02/2017: Right ICA demonstrates nonocclusive disease, mild plaque left ICA.  Antegrade bilateral vertebral artery flow.  Out patient Abdominal aortic duplex 08/16/2015: Moderate dilatation of the abdominal aorta is noted in the mid and distal  aorta. Diffuse plaque noted in the distal aorta. An abdominal aortic aneurysm measuring 4.28 x 4.38 x 4.48 cm is seen.  Scheduled Meds: . amiodarone  200 mg Oral Daily  . apixaban  5 mg Oral BID  . atorvastatin  20 mg Oral Daily  . digoxin  0.25 mg Oral Daily  . fluticasone  2 spray Each Nare Daily  . guaiFENesin  1,200 mg Oral BID  . ipratropium  0.5 mg Nebulization BID  . levalbuterol  0.63 mg Nebulization BID  . loratadine  10 mg Oral Daily  . magnesium oxide  400 mg Oral BID  . mouth rinse  15 mL Mouth Rinse BID  . metoprolol succinate  50 mg Oral Daily  . mometasone-formoterol  2 puff Inhalation BID  . nicotine  21 mg Transdermal Daily   Continuous Infusions:  PRN Meds:.acetaminophen **OR** acetaminophen (TYLENOL) oral liquid 160 mg/5 mL **OR** acetaminophen, clonazePAM, HYDROcodone-acetaminophen, lidocaine, ondansetron **OR** ondansetron (ZOFRAN) IV, senna-docusate   Assessment/Plan:  1.  Atrial flutter with 2:1 conduction. Now in A. Fibrillation with RVR, heart rate still better controlled.  Presently on  Eliquis. History of ablation in 05/12/2012 in [place]. CHA2DS2-VASCScore: Risk Score  6,  Yearly risk of stroke  9.8.  2.  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 3.  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 Acute on chronic systolic and diastolic heart failure.  Told to have normal coronary arteries in 2014 in [place].  May be related to atrial flutter with RVR, alcohol may be contributiing. Eccho in 2015 LVEF  55%. 4.  History of alcoholic abuse, tobacco use disorder, now drinks about 3 ounces of vodka per day and smokes about half a pack of cigarettes per day. 5.  Hypertension 6.  Hyperlipidemia 7.  Severe malnutrition with reduced serum albumin, serum BUN. 8.  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 9.  Abdominal aortic aneurysm by aortic duplex on 08/16/15 revealing 4.3 cm  distal aneurysm.  Recommendation: I will  increase amiodarone to 100 mg by mouth 3 times a day, continue digoxin and mild to moderate dose metoprolol, blood pressure. Will add furosemide 40 mg daily along with Aldactone 25 g every morning. Patient can be transferred out to telemetry.  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 I will leave it to the expert opinion of the oncologists.   Adrian Prows, M.D. 01/05/2017, 1:30 PM [facility] Pager: [phone] Office: [phone] If no answer: [phone]

## 2017-01-05 NOTE — Progress Notes (Signed)
PROGRESS NOTE    Tyler Holland  [id number] DOB: 05-10-1946 DOA: 12/31/2016 PCP: No PCP Per Patient    Brief Narrative:  Patient is a 71 year old gentleman history of head and neck cancer diagnosed in 2012 and treated in [place] with chemotherapy and radiation, history of prostate cancer, hypertension, atrial fibrillation/flutter presented to the ED after 2 falls at home with acute onset of uncontrollable left lower extremity tremor leading to his fall and significant weakness. In the ED patient noted to be in A. fib with RVR and borderline hypotension was initially on IV Cardizem and subsequently transitioned to IV amiodarone. 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 2-D echo done with a EF of 25-30% with global hypokinesis. EEG was negative. Concern for probable TIA and a such stroke workup underway.   Assessment & Plan:   Principal Problem:   TIA (transient ischemic attack): Probable Active Problems:   Atrial fibrillation with RVR (HCC)   ICAO (internal carotid artery occlusion), right   Dehydration   Nasopharyngeal mass   Transient left leg weakness   Ascending aortic aneurysm (HCC)   Hypomagnesemia   Cardiomyopathy ([place])   Right carotid artery occlusion   Head and neck cancer (HCC)   Dysphagia   #1 probable TIA transient left leg tremors/weakness Patient had presented with left leg tremors/weakness. Likely secondary to probable TIA in the setting of high-grade carotid lesion/carotid stenosis. Patient with no further episodes. MRI of the head with an acute occlusion of the right ICA. Patient with risk factors for TIA including A. fib, hypertension, age, history of tobacco use and not on any antiplatelet agents or anticoagulation prior to admission. EEG done was negative. MRA of the head and neck consistent with right ICA occlusion. 2-D echo with a cardiomyopathy and a EF of 25-30% without any source of emboli. Carotid Dopplers consistent with right  ICA occlusive disease on my left ICA with mild amount of plaque without significant stenosis noted. Fasting lipid panel with LDL of 59. Continue Lipitor. Patient has been changed from IV heparin to NOAC Arne Cleveland) for secondary stroke prevention per neurology. PT/OT pending. Neurology following and appreciate input and recommendations.  #2 A. fib with RVR/afultter 2:1 conduction( CHA2DS2VASC score 6) Patient noted on admission to be in A. fib with RVR and initially placed on a Cardizem drip which was discontinued secondary to hypotension and patient on amiodarone drip. TSH within normal limits. Free T4 1.00. Cardiac enzymes negative 3. 2-D echo with a EF of 25-30% with global hypokinesis with more severe inferior hypokinesis to akinesis, elevated left ventricular filling pressure, dilated aortic root 4.3 cm, mild-to-moderate MR, severe biatrial enlargement, mild TR, RVSP 32 mmHg, dilated IVC. Heart rate fluctuating anywhere from 100s to 130s. Patient currently off amiodarone drip and oral amiodarone and toprol and digoxin. Cardizem drip has been discontinued. Patient has been seen in consultation by cardiology and cardiology following. Patient on eliquis secondary to probable TIA and acute occlusion of right ICA . Replete electrolytes and keep magnesium greater than 2 and potassium greater than 4. Cardiology following and appreciate input and recommendations.  #3 dehydration Improved with hydration. Saline lock IV fluids.  #4 postobstructive right middle ear and mastoid effusions Consulted with ENT, however per nursing note have been from Dr. Constance Holster, ENTs office had called and recommended outpatient follow-up in the office. It is noted that ENT was aware of patient's MRI results.  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 Concern for  recurrent tumor versus metastatic disease. Patient with a prior history of head and neck cancer involving the left side of his head behind his ear  approximately 5 years ago, status post chemoradiation in [place], [place]. Consulted with ENT, however per nursing note have been from Dr. Constance Holster, ENTs office had called and recommended outpatient follow-up in the office. It is noted that ENT was aware of patient's MRI results.   #6 acute complete occlusion of right ICA MRI of head and neck showing a acute appearing complete occlusion of the right ICA from bifurcation to the circle of Willis likely secondary to tumor versus related therapy. Due to concern for probable TIA as patient had presented with left leg tremors and weakness MRA of the neck has been ordered per Neurology recommendations and consistent with right ICA occlusion beginning at the bulb likely recent occlusion and likely secondary to nasopharyngeal mass invading the skull base and carotid canal. Due to concern for TIAs and atrial fibrillation IV heparin was discontinued and patient currently on NOAC, eliquis.. Avoid hypotension. Neurology following and appreciate input and recommendations.   #7 hypotension Likely secondary to hypovolemic hypotension versus cardiac etiology vs infetious etiology (unlikely as patient with no fever and normal white count). Cardiac enzymes negative 3. 2-D echo with a cardiomyopathy with a EF of 25-30%. Urinalysis nitrite negative leukocytes negative. Chest x-ray is negative for any acute infiltrate. Hypotension resolved. Saline lock IV fluids. Follow.   #8 cardiomyopathy 2-D echo with a EF of 25-30% with global hypokinesis with more severe inferior hypokinesis to akinesis, elevated left ventricular filling pressure, dilated aortic root to 4.3 cm, mild-to-moderate MR, severe biatrial enlargement, mild TR, RVSP 32 mm of hematocrit, dilated IVC. Patient was on IV heparin secondary to acute occlusion of the right ICA. Patient was also on IV Cardizem and IV amiodarone for atrial fibrillation. Fasting lipid panel with LDL of 59. Hemoglobin A1c = 5.5. Continue  Lipitor. Patient on oral Toprol-XL per cardiology and dose adjusted. Digoxin added to patient's regimen. IV amiodarone has been transitioned to oral amiodarone per cardiology. Cardiology following and appreciate input and recommendations.  #9 ascending thoracic aneurysm measuring 4.2 cm/abdominal aortic aneurysm by duplex on 08/16/2015 revealing 4.3 cm distal aneurysm Continue blood pressure management, risk factor modification. Patient on anticoagulation. Outpatient follow-up.  #10 tobacco abuse Tobacco cessation. Continue nicotine patch.  #11 wheezing Patient with some minimal expiratory wheezing 01/03/2017, which has since improved. Patient with an extensive tobacco history. Repeat chest x-ray with mild increase of interstitial markings and patchy airspace consultation right greater than left which may be pneumonia or asymmetric interstitial pulmonary edema. Patient is afebrile. Patient with no significant respiratory symptoms. Patient does not have a leukocytosis. Highly doubt if this is a clinical pneumonia. Patient got a dose of Lasix 20 mg IV 1 on 01/04/2017, with a urine output of 2.5 L with clinical improvement. No further wheezing noted. Continue Xopenex and Atrovent scheduled nebulizers,Dulera, Claritin, Flonase, Mucinex.  #12 dysphagia Patient currently on a dysphagia 1 diet. Patient has been assessed by speech therapy.   #13 mouth pain Patient has been evaluated by dentistry, Dr. Enrique Sack on 11/25/2016 at that time is noted that patient does have a history of acute pulpitis symptoms, chronic apical periodontitis, dental caries, multiple retained roots segments, chronic periodontitis with bone loss, tooth mobility, gingival recession, multiple missing teeth. Options were presented to the patient by dentistry at the time however patient refused the options and stated he will contact dental medicine when he was  ready to proceed with any kind of dental treatment. Viscous lidocaine as needed.  Outpatient follow-up.     DVT prophylaxis: eliquis Code Status: DNR Family Communication: updated patient and nephew at bedside. Disposition Plan: SNF once heart rate is controlled for his atrial fibrillation and per cardiology.    Consultants:   Cardiology Dr. Martinique 01/01/2017>>>> Dr. Irven Shelling patient  Neurology: Dr. Shon Hale 01/01/2017  Procedures:   CT head 12/31/2016  CT soft tissue neck 12/31/2016  MRI head 01/01/2017  MRI neck 01/01/2017  Chest x-ray 12/31/2016  Plain films of the left wrist 12/31/2016  2-D echo 01/01/2017  EEG 01/01/2017  MRA neck 01/02/2017  Carotid Dopplers 01/02/2017  Antimicrobials:   None   Subjective: Patient states improvement with congestion. No shortness of breath. No chest pain. Patient still with tremors in his leg whenever he tries to ambulate.   Objective: Vitals:   01/04/17 2312 01/05/17 0340 01/05/17 0738 01/05/17 0916  BP: (!) 103/91 107/70 (!) 164/139   Pulse: 100 (!) 104 (!) 116   Resp: 13 (!) 23 17   Temp: 98.8 F (37.1 C) 98.9 F (37.2 C) 98.2 F (36.8 C)   TempSrc: Oral Oral Oral   SpO2: 98% 96% 94% 95%  Weight:  87 kg (191 lb 14.4 oz)    Height:        Intake/Output Summary (Last 24 hours) at 01/05/17 1007 Last data filed at 01/05/17 0830  Gross per 24 hour  Intake              800 ml  Output             2150 ml  Net            -1350 ml   Filed Weights   01/01/17 1700 01/04/17 1000 01/05/17 0340  Weight: 88.5 kg (195 lb) 88.3 kg (194 lb 10.7 oz) 87 kg (191 lb 14.4 oz)    Examination:  General exam: Appears calm and comfortable  Respiratory system: ctab. Respiratory effort normal. Cardiovascular system: Irregularly irregular. No JVD, murmurs, rubs, gallops or clicks. No pedal edema. Gastrointestinal system: Abdomen is nondistended, soft and nontender. No organomegaly or masses felt. Normal bowel sounds heard. Central nervous system: Alert and oriented. No focal neurological  deficits. Extremities: Symmetric 5 x 5 power. Skin: No rashes, lesions or ulcers Psychiatry: Judgement and insight appear normal. Mood & affect appropriate.     Data Reviewed: I have personally reviewed following labs and imaging studies  CBC:  Recent Labs Lab 01/01/17 0934 01/02/17 0250 01/03/17 0308 01/04/17 0520 01/05/17 0207  WBC 7.1 6.3 6.3 6.7 6.7  NEUTROABS 5.8  --   --   --   --   HGB 15.4 14.4 14.0 14.3 14.8  HCT 45.2 43.2 42.1 41.8 43.9  MCV 99.8 100.0 99.5 99.1 99.1  PLT 152 156 149* 152 0000000   Basic Metabolic Panel:  Recent Labs Lab 01/01/17 0934 01/02/17 1046 01/03/17 0308 01/04/17 0520 01/05/17 0207  NA 133* 134* 134* 133* 133*  K 3.5 3.4* 3.6 3.5 3.5  CL 101 101 101 99* 97*  CO2 23 25 26 27 25   GLUCOSE 156* 104* 101* 98 104*  BUN 10 5* 7 6 10   CREATININE 0.68 0.57* 0.70 0.64 0.66  CALCIUM 8.3* 8.5* 8.5* 8.5* 8.5*  MG 1.3* 1.3* 1.7 1.5* 1.6*   GFR: Estimated Creatinine Clearance: 88.7 mL/min (by C-G formula based on SCr of 0.66 mg/dL). Liver Function Tests:  Recent Labs Lab 12/31/16 1441 01/01/17  0934  AST 22 21  ALT 15* 15*  ALKPHOS 75 60  BILITOT 1.3* 1.0  PROT 6.8 5.5*  ALBUMIN 3.2* 2.7*    Recent Labs Lab 12/31/16 1441  LIPASE 19   No results for input(s): AMMONIA in the last 168 hours. Coagulation Profile: No results for input(s): INR, PROTIME in the last 168 hours. Cardiac Enzymes:  Recent Labs Lab 12/31/16 1850 01/01/17 0934 01/01/17 1516 01/01/17 2119  CKTOTAL 80  --   --   --   TROPONINI 0.03* <0.03 <0.03 <0.03   BNP (last 3 results) No results for input(s): PROBNP in the last 8760 hours. HbA1C:  Recent Labs  01/03/17 0308  HGBA1C 5.7*   CBG: No results for input(s): GLUCAP in the last 168 hours. Lipid Profile: No results for input(s): CHOL, HDL, LDLCALC, TRIG, CHOLHDL, LDLDIRECT in the last 72 hours. Thyroid Function Tests: No results for input(s): TSH, T4TOTAL, FREET4, T3FREE, THYROIDAB in the last 72  hours. Anemia Panel: No results for input(s): VITAMINB12, FOLATE, FERRITIN, TIBC, IRON, RETICCTPCT in the last 72 hours. Sepsis Labs:  Recent Labs Lab 12/31/16 1917  LATICACIDVEN 1.76    Recent Results (from the past 240 hour(s))  MRSA PCR Screening     Status: None   Collection Time: 01/01/17  5:10 PM  Result Value Ref Range Status   MRSA by PCR NEGATIVE NEGATIVE Final    Comment:        The GeneXpert MRSA Assay (FDA approved for NASAL specimens only), is one component of a comprehensive MRSA colonization surveillance program. It is not intended to diagnose MRSA infection nor to guide or monitor treatment for MRSA infections.          Radiology Studies: Dg Chest Port 1 View  Result Date: 01/03/2017 CLINICAL DATA:  Chest congestion.  Shortness of breath. EXAM: PORTABLE CHEST 1 VIEW COMPARISON:  12/31/2016 FINDINGS: The cardiac silhouette is stably enlarged. Mediastinal contours appear intact. There is no evidence of pneumothorax. There are mildly increased interstitial markings. Patchy airspace consolidation is seen in the right more than left lower lobes. There is probable left lower lobe atelectasis. Small subpulmonic effusions cannot be excluded. Osseous structures are without acute abnormality. Soft tissues are grossly normal. IMPRESSION: Interval development of mild increase of the interstitial markings and patchy airspace consolidation in right more than left lower lobe. This may represent multifocal pneumonia or asymmetric interstitial pulmonary edema. Electronically Signed   By: Fidela Salisbury M.D.   On: 01/03/2017 13:17        Scheduled Meds: . amiodarone  200 mg Oral Daily  . apixaban  5 mg Oral BID  . atorvastatin  20 mg Oral Daily  . digoxin  0.25 mg Oral Daily  . fluticasone  2 spray Each Nare Daily  . guaiFENesin  1,200 mg Oral BID  . ipratropium  0.5 mg Nebulization BID  . levalbuterol  0.63 mg Nebulization BID  . loratadine  10 mg Oral Daily   . magnesium oxide  400 mg Oral BID  . magnesium sulfate 1 - 4 g bolus IVPB  4 g Intravenous Once  . mouth rinse  15 mL Mouth Rinse BID  . metoprolol succinate  50 mg Oral Daily  . mometasone-formoterol  2 puff Inhalation BID  . nicotine  21 mg Transdermal Daily   Continuous Infusions:    LOS: 4 days    Time spent: [place], MD [facility] Pager [phone]  If 7PM-7AM, please contact  night-coverage [email or web address] Password [email or web address] 01/05/2017, 10:07 AM

## 2017-01-05 NOTE — Progress Notes (Signed)
Pt transferred from 4N, he is alert and oriented complaining of 10 out of 10 mouth pain, oriented pt to staff and room as well as safety procedures, will continue to monitor.

## 2017-01-05 NOTE — Clinical Social Work Note (Signed)
CSW met with pt/nephew @ bedside and presented bed offers.   Pt/Nephew selected [facility]. Facility notified that pt will accept bed offer.  Kamika Goodloe B. Joline Maxcy Clinical Social Work Dept Weekend Social Worker [phone] 11:20 AM

## 2017-01-06 ENCOUNTER — Inpatient Hospital Stay (HOSPITAL_COMMUNITY): Payer: Medicare Other

## 2017-01-06 LAB — BASIC METABOLIC PANEL
ANION GAP: 9 (ref 5–15)
BUN: 11 mg/dL (ref 6–20)
CALCIUM: 8.8 mg/dL — AB (ref 8.9–10.3)
CHLORIDE: 98 mmol/L — AB (ref 101–111)
CO2: 26 mmol/L (ref 22–32)
Creatinine, Ser: 0.69 mg/dL (ref 0.61–1.24)
GFR calc non Af Amer: >60 mL/min (ref >60)
GLUCOSE: 105 mg/dL — AB (ref 65–99)
Potassium: 4 mmol/L (ref 3.5–5.1)
Sodium: 133 mmol/L — ABNORMAL LOW (ref 135–145)

## 2017-01-06 LAB — MAGNESIUM: Magnesium: 1.7 mg/dL (ref 1.7–2.4)

## 2017-01-06 LAB — CBC
HEMATOCRIT: 44.5 % (ref 39.0–52.0)
HEMOGLOBIN: 14.9 g/dL (ref 13.0–17.0)
MCH: 33.2 pg (ref 26.0–34.0)
MCHC: 33.5 g/dL (ref 30.0–36.0)
MCV: 99.1 fL (ref 78.0–100.0)
Platelets: 192 10*3/uL (ref 150–400)
RBC: 4.49 MIL/uL (ref 4.22–5.81)
RDW: 12.7 % (ref 11.5–15.5)
WBC: 5.8 10*3/uL (ref 4.0–10.5)

## 2017-01-06 MED ORDER — SODIUM CHLORIDE 0.9 % IV BOLUS (SEPSIS)
500.0000 mL | Freq: Once | INTRAVENOUS | Status: AC
  Administered 2017-01-06: 500 mL via INTRAVENOUS

## 2017-01-06 MED ORDER — METOPROLOL SUCCINATE ER 25 MG PO TB24
12.5000 mg | ORAL_TABLET | Freq: Two times a day (BID) | ORAL | Status: DC
  Filled 2017-01-06: qty 1

## 2017-01-06 MED ORDER — ISOSORB DINITRATE-HYDRALAZINE 20-37.5 MG PO TABS
1.0000 | ORAL_TABLET | Freq: Three times a day (TID) | ORAL | Status: DC
  Administered 2017-01-06: 1 via ORAL
  Filled 2017-01-06 (×4): qty 1

## 2017-01-06 MED ORDER — LEVALBUTEROL HCL 0.63 MG/3ML IN NEBU: 0.6300 mg | INHALATION_SOLUTION | Freq: Four times a day (QID) | RESPIRATORY_TRACT | Status: DC | PRN

## 2017-01-06 MED ORDER — HYDROXYZINE HCL 25 MG PO TABS: 25.0000 mg | ORAL_TABLET | Freq: Four times a day (QID) | ORAL | Status: DC | PRN

## 2017-01-06 MED ORDER — ATORVASTATIN CALCIUM 10 MG PO TABS
10.0000 mg | ORAL_TABLET | Freq: Every day | ORAL | Status: DC
  Administered 2017-01-06 – 2017-01-08 (×3): 10 mg via ORAL
  Filled 2017-01-06 (×3): qty 1

## 2017-01-06 MED ORDER — MAGNESIUM SULFATE 4 GM/100ML IV SOLN
4.0000 g | Freq: Once | INTRAVENOUS | Status: AC
  Administered 2017-01-06: 4 g via INTRAVENOUS
  Filled 2017-01-06: qty 100

## 2017-01-06 NOTE — Progress Notes (Signed)
Modified Barium Swallow Progress Note  Patient Details  Name: Tyler Holland MRN: [id number] Date of Birth: 1946-08-27  Today's Date: 01/06/2017  Modified Barium Swallow completed.  Full report located under Chart Review in the Imaging Section.  Brief recommendations include the following:  Clinical Impression  Pt has a moderate oropharyngeal dysphagia, suspect secondary to presence of nasopharyngeal mass. He has weak lingual propulsion and incomplete velopharyngeal closure that allows for nasal regurgitation of all consistencies. This mostly clears after the swallow as nasopharyngeal residue spills downward into the pharynx, but larger straw sips do travel deeper through the nasal cavity. He has a delayed swallow trigger that results in flash penetration of thin liquids via straw as well. Pt has decreased ability to establish intraoral pressure to assist in bolus propulsion through the pharynx, resulting in moderate, diffuse residue post-swallow. This can be reduced with a cued second swallow, but attempt at a chin tuck results in increased nasal regurgitation. Recommend to continue with Dys 1 diet and thin liquids by cup, although with strict control of bolus size and use of second swallow per bolus. Will continue to follow.   Swallow Evaluation Recommendations   Recommended Consults: Consider ENT evaluation   SLP Diet Recommendations: Dysphagia 1 (Puree) solids;Thin liquid   Liquid Administration via: Cup;No straw   Medication Administration: Crushed with puree   Supervision: Patient able to self feed;Full supervision/cueing for compensatory strategies   Compensations: Slow rate;Small sips/bites;Multiple dry swallows after each bite/sip   Postural Changes: Remain semi-upright after after feeds/meals (Comment);Seated upright at 90 degrees   Oral Care Recommendations: Oral care BID   Other Recommendations: Have oral suction available    Germain Osgood 01/06/2017,3:13 PM    Germain Osgood, M.A. CCC-SLP [phone]

## 2017-01-06 NOTE — Progress Notes (Signed)
Occupational Therapy Treatment Patient Details Name: Tyler Holland MRN: [id number] DOB: [birth year] Today's Date: 01/06/2017    History of present illness  Tyler Holland is a 71 y.o. gentleman with a history of head and neck cancer (diagnosed in 2012), prostate cancer, HTN, and atrial fibrillation/flutter presented to ED wtih Left leg "quivering", weakness, recurrent falls x2. 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   OT comments  Pt progressing toward OT goals and motivated to get up and moving this afternoon. Pt able to stand at sink with min guard assist and no UE support for grooming tasks this session as well as complete simulated toilet transfer with min assist. Pt continues to require increased time for processing and demonstrates poor safety awareness during session today. He would benefit from continued OT services while admitted to improve independence and safety with ADL and functional mobility and D/C plan for SNF remains appropriate.  Follow Up Recommendations  SNF    Equipment Recommendations  3 in 1 bedside commode;Tub/shower seat    Recommendations for Other Services      Precautions / Restrictions Precautions Precautions: Fall Restrictions Weight Bearing Restrictions: No       Mobility Bed Mobility Overal bed mobility: Needs Assistance Bed Mobility: Supine to Sit Rolling: Min assist   Supine to sit: Mod assist     General bed mobility comments: Assist to elevate trunk into sitting. Incr time and verbal cues.  Transfers Overall transfer level: Needs assistance Equipment used: Rolling walker (2 wheeled) Transfers: Sit to/from Stand Sit to Stand: Min assist         General transfer comment: MIn assist  to power up from bed. Pt requires multiple verbal cues for safe hand placement and requires increased time to process instructions.    Balance Overall balance assessment: Needs assistance Sitting-balance support: Feet supported;No upper extremity supported Sitting balance-Leahy Scale: Good     Standing balance support: Single extremity supported Standing balance-Leahy Scale: Fair Standing balance comment: Able to stand at sink for grooming tasks with min guard assist and no UE support.                   ADL Overall ADL's : Needs assistance/impaired Eating/Feeding: Independent;Sitting   Grooming: Min Dispensing optician: Minimal assistance;Ambulation;RW;BSC Toilet Transfer Details (indicate cue type and reason): Simulated with sit<>stand followed by functional mobility.         Functional mobility during ADLs: Minimal assistance;Rolling walker General ADL Comments: Min assist required as pt's L LE buckles frequently.      Vision                     Perception     Praxis      Cognition   Behavior During Therapy: Flat affect Overall Cognitive Status: No family/caregiver present to determine baseline cognitive functioning Area of Impairment: Problem solving;Safety/judgement;Attention   Current Attention Level: Sustained      Safety/Judgement: Decreased awareness of safety   Problem Solving: Slow processing General Comments: however pt did stay at home in pretty much in bed for 2 days prior to admission because he did not have anyone to bring him to  the hosptial per chart    Extremity/Trunk Assessment               Exercises     Shoulder Instructions       General Comments      Pertinent Vitals/ Pain       Pain Assessment: 0-10 Pain Score: 3  Pain Location: mouth Pain Descriptors / Indicators: Aching;Dull;Constant Pain Intervention(s): Monitored during session  Home Living                                           Prior Functioning/Environment              Frequency  Min 2X/week        Progress Toward Goals  OT Goals(current goals can now be found in the care plan section)  Progress towards OT goals: Progressing toward goals  Acute Rehab OT Goals Patient Stated Goal: to walk and get stronger OT Goal Formulation: With patient Time For Goal Achievement: 01/15/17 Potential to Achieve Goals: Good ADL Goals Pt Will Perform Grooming: with supervision;standing Pt Will Perform Upper Body Bathing: with supervision;sitting;standing Pt Will Perform Lower Body Bathing: with supervision;sit to/from stand Pt Will Perform Upper Body Dressing: with supervision;standing;sitting Pt Will Perform Lower Body Dressing: with supervision;sit to/from stand Pt Will Transfer to Toilet: with supervision;ambulating;bedside commode (over toilet) Pt Will Perform Toileting - Clothing Manipulation and hygiene: with supervision;sit to/from stand Additional ADL Goal #1: Independent in and OOB for basic ADLs  Plan Discharge plan remains appropriate    Co-evaluation                 End of Session Equipment Utilized During Treatment: Gait belt;Rolling walker   Activity Tolerance Patient tolerated treatment well   Patient Left in bed;with call bell/phone within reach;with bed alarm set   Nurse Communication Mobility status        Time: [id number] OT Time Calculation (min): 24 min  Charges: OT General Charges $OT Visit: 1 Procedure OT Treatments $Self Care/Home Management : 23-37 mins  Norman Herrlich, OTR/L [phone] 01/06/2017, 5:24 PM

## 2017-01-06 NOTE — Progress Notes (Signed)
Subjective:  Patient is feeling better. No specific complaints. No clinical e/o CHF.   Objective:  Vital Signs in the last 24 hours: Temp:  [97.7 F (36.5 C)-99.4 F (37.4 C)] 98.1 F (36.7 C) (01/29 0800) Pulse Rate:  [53-122] 64 (01/29 0800) Resp:  [18-25] 18 (01/29 0554) BP: (99-119)/(51-102) 106/51 (01/29 0800) SpO2:  [92 %-98 %] 96 % (01/29 0800) Weight:  [187 lb 14.4 oz (85.2 kg)-188 lb 7.9 oz (85.5 kg)] 188 lb 7.9 oz (85.5 kg) (01/29 0500)  Intake/Output from previous day: 01/28 0701 - 01/29 0700 In: 720 [P.O.:620; IV Piggyback:100] Out: 1625 [Urine:1625]  Physical Exam: General appearance: alert, cooperative, appears older than stated age, fatigued and no distress. Ill appearing Eyes: negative findings: lids and lashes normal and conjunctivae and sclerae normal Neck: no carotid bruit, no JVD, thyroid not enlarged, symmetric, no tenderness/mass/nodules and Hard skin of the neck from prior RT and ? mass submandibular region right. Halitosis present  Neck: JVP - normal, carotids without bruits Resp: clear to auscultation bilaterally Chest wall: no tenderness Cardio: Tachycardia present.  Irregular, S1 variable, S2 normal, no murmur, click, rub or gallop and tahycardia present GI: soft, non-tender; bowel sounds normal; no masses,  no organomegaly Extremities: extremities normal, atraumatic, no cyanosis or edema Lab Results: BMP  Recent Labs  01/04/17 0520 01/05/17 0207 01/06/17 0137  NA 133* 133* 133*  K 3.5 3.5 4.0  CL 99* 97* 98*  CO2 27 25 26   GLUCOSE 98 104* 105*  BUN 6 10 11   CREATININE 0.64 0.66 0.69  CALCIUM 8.5* 8.5* 8.8*  GFRNONAA >60 >60 >60  GFRAA >60 >60 >60    CBC  Recent Labs Lab 01/01/17 0934  01/06/17 0137  WBC 7.1  < > 5.8  RBC 4.53  < > 4.49  HGB 15.4  < > 14.9  HCT 45.2  < > 44.5  PLT 152  < > 192  MCV 99.8  < > 99.1  MCH 34.0  < > 33.2  MCHC 34.1  < > 33.5  RDW 12.8  < > 12.7  LYMPHSABS 0.8  --   --   MONOABS 0.5  --   --    EOSABS 0.1  --   --   BASOSABS 0.0  --   --   < > = values in this interval not displayed.  HEMOGLOBIN A1C Lab Results  Component Value Date   HGBA1C 5.7 (H) 01/03/2017   MPG 117 01/03/2017    Recent Labs  12/31/16 1850 01/01/17 0934 01/01/17 1516 01/01/17 2119  CKTOTAL 80  --   --   --   TROPONINI 0.03* <0.03 <0.03 <0.03    Recent Labs  01/01/17 0631  TSH 2.810    CHOLESTEROL  Recent Labs  01/01/17 1507  CHOL 105    Hepatic Function Panel  Recent Labs  12/31/16 1441 01/01/17 0934  PROT 6.8 5.5*  ALBUMIN 3.2* 2.7*  AST 22 21  ALT 15* 15*  ALKPHOS 75 60  BILITOT 1.3* 1.0    Imaging: Imaging results have been reviewed  Cardiac Studies: EKG 01/01/2017: Atrial flutter with 2:1 conduction at the rate of 135 bpm.  No evidence of ischemia.  Normal QT interval.  Tele 01/03/16: A. Fibrillation with RVR, HR better controlled.  Echocardiogram 01/02/2016: Normal LV size, marked global hypokinesis, severe LV systolic dysfunction, EF [id number] with grade 2 diastolic dysfunction.  Mild aortic root dilatation at 43 mm.  Mild to moderate MR.  Mild RV dilatation.  Moderate to back percent.  Moderately reduced RV systolic function.  Mild pulmonary hypertension.  PA pressure 32 mmHg.  IVC dilated with blunted respiratory response.  Echocardiogram 04/2014(not my office, but outside) : Left ventricular size is mildly increased.  Wall thickness is mildly increased.  LVEF 50-55%.  Borderline global hypokinesis.  Mitral annulus is mildly calcified.  Leaflets are mildly thickened.  Systolic bowing without prolapse.  Mild, eccentric regurgitation.  Left atrium mildly dilated.  Carotid artery duplex 01/02/2017: Right ICA demonstrates nonocclusive disease, mild plaque left ICA.  Antegrade bilateral vertebral artery flow.  Out patient Abdominal aortic duplex 08/16/2015: Moderate dilatation of the abdominal aorta is noted in the mid and distal aorta. Diffuse plaque noted in the distal  aorta. An abdominal aortic aneurysm measuring 4.28 x 4.38 x 4.48 cm is seen.  Scheduled Meds: . amiodarone  400 mg Oral TID  . apixaban  5 mg Oral BID  . atorvastatin  10 mg Oral Daily  . digoxin  0.25 mg Oral Daily  . fluticasone  2 spray Each Nare Daily  . guaiFENesin  1,200 mg Oral BID  . isosorbide-hydrALAZINE  1 tablet Oral TID  . loratadine  10 mg Oral Daily  . magnesium oxide  400 mg Oral BID  . magnesium sulfate 1 - 4 g bolus IVPB  4 g Intravenous Once  . mouth rinse  15 mL Mouth Rinse BID  . metoprolol succinate  50 mg Oral Daily  . mometasone-formoterol  2 puff Inhalation BID  . nicotine  21 mg Transdermal Daily  . spironolactone  25 mg Oral Daily   Continuous Infusions:  PRN Meds:.acetaminophen **OR** acetaminophen (TYLENOL) oral liquid 160 mg/5 mL **OR** acetaminophen, clonazePAM, HYDROcodone-acetaminophen, hydrOXYzine, levalbuterol, lidocaine, ondansetron **OR** ondansetron (ZOFRAN) IV, senna-docusate   Assessment/Plan:  1.  Atrial flutter with 2:1 conduction. Now in A. Fibrillation with RVR, heart rate still better controlled.  Presently on  Eliquis. History of ablation in 05/12/2012 in [place]. CHA2DS2-VASCScore: Risk Score  6,  Yearly risk of stroke  9.8.  2.  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 3.  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 Acute on chronic systolic and diastolic heart failure.  Told to have normal coronary arteries in 2014 in [place].  May be related to atrial flutter with RVR, alcohol may be contributiing. Eccho in 2015 LVEF  55%. 4.  History of alcoholic abuse, tobacco use disorder, now drinks about 3 ounces of vodka per day and smokes about half a pack of cigarettes per day. 5.  Hypertension 6.  Hyperlipidemia 7.  Severe malnutrition with reduced serum albumin, serum BUN. 8.  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 9.  Abdominal aortic aneurysm  by aortic duplex on 08/16/15 revealing 4.3 cm distal aneurysm.  Recommendation: Added Bidil to his medications and reduced lipitor to 10 mg due to very low cholesterol, nutritional state. Daily weight, ambulate and up in chair. From cardiac standpoint, should be able to go home or discharged. I am fine with HR 90 to 110/min for now.   Not sure on long term plans on malignancy and placement.    Adrian Prows, M.D. 01/06/2017, 9:07 AM [facility] Pager: [phone] Office: [phone] If no answer: [phone]

## 2017-01-06 NOTE — Progress Notes (Addendum)
Patient's BP is reported to MD. Patient is not symptomatic. Will continue to monitor  Administered bolus per order-Will continue to monitor

## 2017-01-06 NOTE — Care Management Note (Signed)
Case Management Note  Patient Details  Name: Tyler Holland MRN: [id number] Date of Birth: 1946/07/04  Subjective/Objective:                    Action/Plan: Plan is to discharge to [facility] tomorrow. CM following.  Expected Discharge Date:                  Expected Discharge Plan:  [place]  In-House Referral:  Clinical Social Work  Discharge planning Services  CM Consult, Medication Assistance  Post Acute Care Choice:    Choice offered to:     DME Arranged:    DME Agency:     HH Arranged:    HH Agency:     Status of Service:  In process, will continue to follow  If discussed at Long Length of Stay Meetings, dates discussed:    Additional Comments:  Pollie Friar, RN 01/06/2017, 1:19 PM

## 2017-01-06 NOTE — Progress Notes (Signed)
Physical Therapy Treatment Patient Details Name: Tyler Holland MRN: [id number] DOB: 03-14-1946 Today's Date: 01/06/2017    History of Present Illness  Tyler Holland is a 71 y.o. gentleman with a history of head and neck cancer (diagnosed in 2012), prostate cancer, HTN, and atrial fibrillation/flutter presented to ED wtih Left leg "quivering", weakness, recurrent falls x2. 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    PT Comments    Pt progressing slowly towards physical therapy goals. Was able to perform transfers and ambulation with hands-on guarding for balance support and safety. Pt with generally poor posture - could make some corrective changes with VC's however unable to maintain. Pt anticipates d/c to SNF for continued rehab prior to return home. Feel this is appropriate given current deficits and continued low tolerance for functional activity.   Follow Up Recommendations  SNF     Equipment Recommendations   (TBD by next venue of care)    Recommendations for Other Services       Precautions / Restrictions Precautions Precautions: Fall Restrictions Weight Bearing Restrictions: No    Mobility  Bed Mobility Overal bed mobility: Needs Assistance Bed Mobility: Supine to Sit     Supine to sit: Min assist     General bed mobility comments: Assist to elevate trunk into sitting. Incr time and verbal cues.  Transfers Overall transfer level: Needs assistance Equipment used: Rolling walker (2 wheeled) Transfers: Sit to/from Stand Sit to Stand: Min assist;Min guard         General transfer comment: Pt continually trying to stand with hands on walker. Required min assist this way, despite cues for hand placement on  seated surface for safety. Later in session pt stood pushing up from bed and only required min guard.   Ambulation/Gait Ambulation/Gait assistance: Min guard Ambulation Distance (Feet): 125 Feet Assistive device: Rolling walker (2 wheeled) Gait Pattern/deviations: Step-through pattern;Decreased step length - left;Decreased step length - right Gait velocity: Decreased Gait velocity interpretation: Below normal speed for age/gender General Gait Details: Unsteady, slow gait. Pt with unstable LLE and noted shaking in knee and ankle with weight bearing. Was able to perform standing marching with UE support, however not able to complete without walker.    Stairs            Wheelchair Mobility    Modified Rankin (Stroke Patients Only) Modified Rankin (Stroke Patients Only) Pre-Morbid Rankin Score: No significant disability Modified Rankin: Moderately severe disability     Balance Overall balance assessment: Needs assistance Sitting-balance support: Feet supported;No upper extremity supported Sitting balance-Leahy Scale: Good     Standing balance support: Single extremity supported Standing balance-Leahy Scale: Poor Standing balance comment: UE support                    Cognition Arousal/Alertness: Awake/alert Behavior During Therapy: Flat affect Overall Cognitive Status: No family/caregiver present to determine baseline cognitive functioning Area of Impairment: Problem solving             Problem Solving: Slow processing General Comments: however pt did stay at home in pretty much in bed for 2 days prior to admission because he did not have anyone to bring him to the hosptial per chart    Exercises      General  Comments        Pertinent Vitals/Pain Pain Assessment: Faces Faces Pain Scale: Hurts a little bit Pain Location: mouth Pain Descriptors / Indicators: Aching;Dull;Constant Pain Intervention(s): Limited activity within patient's  tolerance;Monitored during session;RN gave pain meds during session    Home Living                      Prior Function            PT Goals (current goals can now be found in the care plan section) Acute Rehab PT Goals Patient Stated Goal: not stated PT Goal Formulation: With patient Time For Goal Achievement: 01/16/17 Potential to Achieve Goals: Good Progress towards PT goals: Progressing toward goals    Frequency    Min 3X/week      PT Plan Current plan remains appropriate    Co-evaluation             End of Session Equipment Utilized During Treatment: Gait belt Activity Tolerance: Patient tolerated treatment well Patient left: in chair;with call bell/phone within reach;with chair alarm set     Time: 1057-1130 PT Time Calculation (min) (ACUTE ONLY): 33 min  Charges:  $Gait Training: 23-37 mins                    G Codes:      Thelma Comp 01/19/17, 1:17 PM  Rolinda Roan, PT, DPT Acute Rehabilitation Services Pager: [phone]

## 2017-01-06 NOTE — Progress Notes (Signed)
PROGRESS NOTE    Tyler Holland  [id number] DOB: 12-24-1945 DOA: 12/31/2016 PCP: No PCP Per Patient    Brief Narrative:  Patient is a 71 year old gentleman history of head and neck cancer diagnosed in 2012 and treated in [place] with chemotherapy and radiation, history of prostate cancer, hypertension, atrial fibrillation/flutter presented to the ED after 2 falls at home with acute onset of uncontrollable left lower extremity tremor leading to his fall and significant weakness. In the ED patient noted to be in A. fib with RVR and borderline hypotension was initially on IV Cardizem and subsequently transitioned to IV amiodarone. 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 2-D echo done with a EF of 25-30% with global hypokinesis. EEG was negative. Concern for probable TIA and a such stroke workup underway.   Assessment & Plan:   Principal Problem:   TIA (transient ischemic attack): Probable Active Problems:   Atrial fibrillation with RVR (HCC)   ICAO (internal carotid artery occlusion), right   Dehydration   Nasopharyngeal mass   Transient left leg weakness   Ascending aortic aneurysm (HCC)   Hypomagnesemia   Cardiomyopathy (HCC)   Right carotid artery occlusion   Head and neck cancer (HCC)   Dysphagia   Chest congestion   #1 probable TIA transient left leg tremors/weakness Patient had presented with left leg tremors/weakness. Likely secondary to probable TIA in the setting of high-grade carotid lesion/carotid stenosis. Patient with no further episodes. MRI of the head with an acute occlusion of the right ICA. Patient with risk factors for TIA including A. fib, hypertension, age, history of tobacco use and not on any antiplatelet agents or anticoagulation prior to admission. EEG done was negative. MRA of the head and neck consistent with right ICA occlusion. 2-D echo with a cardiomyopathy and a EF of 25-30% without any source of emboli. Carotid Dopplers  consistent with right ICA occlusive disease on my left ICA with mild amount of plaque without significant stenosis noted. Fasting lipid panel with LDL of 59. Continue Lipitor. Patient has been changed from IV heparin to NOAC Arne [place]) for secondary stroke prevention per neurology. PT/OT pending. Neurology following and appreciate input and recommendations.  #2 A. fib with RVR/afultter 2:1 conduction( CHA2DS2VASC score 6) Patient noted on admission to be in A. fib with RVR and initially placed on a Cardizem drip which was discontinued secondary to hypotension and patient on amiodarone drip. TSH within normal limits. Free T4 1.00. Cardiac enzymes negative 3. 2-D echo with a EF of 25-30% with global hypokinesis with more severe inferior hypokinesis to akinesis, elevated left ventricular filling pressure, dilated aortic root 4.3 cm, mild-to-moderate MR, severe biatrial enlargement, mild TR, RVSP 32 mmHg, dilated IVC. Heart rate fluctuating anywhere from 100s to 130s. Patient currently off amiodarone drip and oral amiodarone and toprol and digoxin. Cardizem drip has been discontinued. Patient has been seen in consultation by cardiology and cardiology following. Patient on eliquis secondary to probable TIA and acute occlusion of right ICA . Replete electrolytes and keep magnesium greater than 2 and potassium greater than 4. Cardiology following and appreciate input and recommendations.  #3 dehydration Improved with hydration. Saline lock IV fluids.  #4 postobstructive right middle ear and mastoid effusions Consulted with ENT, however per nursing note have been from Dr. Constance Holster, ENTs office had called and recommended outpatient follow-up in the office. It is noted that ENT was aware of patient's MRI results.  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 Concern for recurrent tumor versus metastatic disease. Patient with a prior history of head and neck cancer involving the left side of his head  behind his ear approximately 5 years ago, status post chemoradiation in [place], [place]. Consulted with ENT, however per nursing note have been from Dr. Constance Holster, ENTs office had called and recommended outpatient follow-up in the office. It is noted that ENT was aware of patient's MRI results.   #6 acute complete occlusion of right ICA MRI of head and neck showing a acute appearing complete occlusion of the right ICA from bifurcation to the circle of Willis likely secondary to tumor versus related therapy. Due to concern for probable TIA as patient had presented with left leg tremors and weakness MRA of the neck has been ordered per Neurology recommendations and consistent with right ICA occlusion beginning at the bulb likely recent occlusion and likely secondary to nasopharyngeal mass invading the skull base and carotid canal. Due to concern for TIAs and atrial fibrillation IV heparin was discontinued and patient currently on NOAC, eliquis.. Avoid hypotension. Neurology following and appreciate input and recommendations.   #7 hypotension Likely secondary to hypovolemic hypotension versus cardiac etiology vs infetious etiology (unlikely as patient with no fever and normal white count). Cardiac enzymes negative 3. 2-D echo with a cardiomyopathy with a EF of 25-30%. Urinalysis nitrite negative leukocytes negative. Chest x-ray is negative for any acute infiltrate. Hypotension resolved. Saline lock IV fluids. Follow.   #8 cardiomyopathy 2-D echo with a EF of 25-30% with global hypokinesis with more severe inferior hypokinesis to akinesis, elevated left ventricular filling pressure, dilated aortic root to 4.3 cm, mild-to-moderate MR, severe biatrial enlargement, mild TR, RVSP 32 mm of hematocrit, dilated IVC. Patient was on IV heparin secondary to acute occlusion of the right ICA. Patient was also on IV Cardizem and IV amiodarone for atrial fibrillation. Fasting lipid panel with LDL of 59. Hemoglobin A1c = 5.5.  Continue Lipitor. Patient on oral Toprol-XL per cardiology and dose adjusted. Digoxin added to patient's regimen. IV amiodarone has been transitioned to oral amiodarone per cardiology. BiDil and Aldactone have been added to patient's regimen per cardiology. Cardiology following and appreciate input and recommendations.  #9 ascending thoracic aneurysm measuring 4.2 cm/abdominal aortic aneurysm by duplex on 08/16/2015 revealing 4.3 cm distal aneurysm Continue blood pressure management, risk factor modification. Patient on anticoagulation. Outpatient follow-up.  #10 tobacco abuse Tobacco cessation. Continue nicotine patch.  #11 wheezing Patient with some minimal expiratory wheezing 01/03/2017, which has since improved. Patient with an extensive tobacco history. Repeat chest x-ray with mild increase of interstitial markings and patchy airspace consultation right greater than left which may be pneumonia or asymmetric interstitial pulmonary edema. Patient is afebrile. Patient with no significant respiratory symptoms. Patient does not have a leukocytosis. Highly doubt if this is a clinical pneumonia. Patient got a dose of Lasix 20 mg IV 1 on 01/04/2017, with a urine output of 2.5 L with clinical improvement. No further wheezing noted. Continue Xopenex and Atrovent scheduled nebulizers,Dulera, Claritin, Flonase, Mucinex.  #12 dysphagia Patient currently on a dysphagia 1 diet. Patient has been assessed by speech therapy.   #13 mouth pain Patient has been evaluated by dentistry, Dr. Enrique Sack on 11/25/2016 at that time is noted that patient does have a history of acute pulpitis symptoms, chronic apical periodontitis, dental caries, multiple retained roots segments, chronic periodontitis with bone loss, tooth mobility, gingival recession, multiple missing teeth. Options were presented to the patient by dentistry at the time  however patient refused the options and stated he will contact dental medicine when he  was ready to proceed with any kind of dental treatment. Viscous lidocaine as needed. Outpatient follow-up.     DVT prophylaxis: eliquis Code Status: DNR Family Communication: updated patient. No family at bedside. Disposition Plan: SNF once heart rate is controlled for his atrial fibrillation and per cardiology.    Consultants:   Cardiology Dr. Martinique 01/01/2017>>>> Dr. Irven Shelling patient  Neurology: Dr. Shon Hale 01/01/2017  Procedures:   CT head 12/31/2016  CT soft tissue neck 12/31/2016  MRI head 01/01/2017  MRI neck 01/01/2017  Chest x-ray 12/31/2016  Plain films of the left wrist 12/31/2016  2-D echo 01/01/2017  EEG 01/01/2017  MRA neck 01/02/2017  Carotid Dopplers 01/02/2017  Antimicrobials:   None   Subjective: Patient states improvement with congestion. No shortness of breath. No chest pain. Patient states ambulated in hallway today.  Objective: Vitals:   01/06/17 0554 01/06/17 0800 01/06/17 1031 01/06/17 1344  BP: 119/73 (!) 106/51 99/75 (!) 89/65  Pulse: (!) 104 64  (!) 57  Resp: 18   17  Temp: 97.8 F (36.6 C) 98.1 F (36.7 C)  98.6 F (37 C)  TempSrc: Oral Oral  Oral  SpO2: 98% 96%    Weight:      Height:        Intake/Output Summary (Last 24 hours) at 01/06/17 1525 Last data filed at 01/06/17 0800  Gross per 24 hour  Intake              240 ml  Output              950 ml  Net             -710 ml   Filed Weights   01/05/17 0340 01/05/17 2224 01/06/17 0500  Weight: 87 kg (191 lb 14.4 oz) 85.2 kg (187 lb 14.4 oz) 85.5 kg (188 lb 7.9 oz)    Examination:  General exam: Appears calm and comfortable  Respiratory system: ctab. Respiratory effort normal. Cardiovascular system: Irregularly irregular. No JVD, murmurs, rubs, gallops or clicks. No pedal edema. Gastrointestinal system: Abdomen is nondistended, soft and nontender. No organomegaly or masses felt. Normal bowel sounds heard. Central nervous system: Alert and oriented. No focal  neurological deficits. Extremities: Symmetric 5 x 5 power. Skin: No rashes, lesions or ulcers Psychiatry: Judgement and insight appear normal. Mood & affect appropriate.     Data Reviewed: I have personally reviewed following labs and imaging studies  CBC:  Recent Labs Lab 01/01/17 0934 01/02/17 0250 01/03/17 0308 01/04/17 0520 01/05/17 0207 01/06/17 0137  WBC 7.1 6.3 6.3 6.7 6.7 5.8  NEUTROABS 5.8  --   --   --   --   --   HGB 15.4 14.4 14.0 14.3 14.8 14.9  HCT 45.2 43.2 42.1 41.8 43.9 44.5  MCV 99.8 100.0 99.5 99.1 99.1 99.1  PLT 152 156 149* 152 175 [id number]   Basic Metabolic Panel:  Recent Labs Lab 01/02/17 1046 01/03/17 0308 01/04/17 0520 01/05/17 0207 01/06/17 0137  NA 134* 134* 133* 133* 133*  K 3.4* 3.6 3.5 3.5 4.0  CL 101 101 99* 97* 98*  CO2 25 26 27 25 26   GLUCOSE 104* 101* 98 104* 105*  BUN 5* 7 6 10 11   CREATININE 0.57* 0.70 0.64 0.66 0.69  CALCIUM 8.5* 8.5* 8.5* 8.5* 8.8*  MG 1.3* 1.7 1.5* 1.6* 1.7   GFR: Estimated Creatinine Clearance: 88.7 mL/min (by C-G formula  based on SCr of 0.69 mg/dL). Liver Function Tests:  Recent Labs Lab 12/31/16 1441 01/01/17 0934  AST 22 21  ALT 15* 15*  ALKPHOS 75 60  BILITOT 1.3* 1.0  PROT 6.8 5.5*  ALBUMIN 3.2* 2.7*    Recent Labs Lab 12/31/16 1441  LIPASE 19   No results for input(s): AMMONIA in the last 168 hours. Coagulation Profile: No results for input(s): INR, PROTIME in the last 168 hours. Cardiac Enzymes:  Recent Labs Lab 12/31/16 1850 01/01/17 0934 01/01/17 1516 01/01/17 2119  CKTOTAL 80  --   --   --   TROPONINI 0.03* <0.03 <0.03 <0.03   BNP (last 3 results) No results for input(s): PROBNP in the last 8760 hours. HbA1C: No results for input(s): HGBA1C in the last 72 hours. CBG: No results for input(s): GLUCAP in the last 168 hours. Lipid Profile: No results for input(s): CHOL, HDL, LDLCALC, TRIG, CHOLHDL, LDLDIRECT in the last 72 hours. Thyroid Function Tests: No results for  input(s): TSH, T4TOTAL, FREET4, T3FREE, THYROIDAB in the last 72 hours. Anemia Panel: No results for input(s): VITAMINB12, FOLATE, FERRITIN, TIBC, IRON, RETICCTPCT in the last 72 hours. Sepsis Labs:  Recent Labs Lab 12/31/16 1917  LATICACIDVEN 1.76    Recent Results (from the past 240 hour(s))  MRSA PCR Screening     Status: None   Collection Time: 01/01/17  5:10 PM  Result Value Ref Range Status   MRSA by PCR NEGATIVE NEGATIVE Final    Comment:        The GeneXpert MRSA Assay (FDA approved for NASAL specimens only), is one component of a comprehensive MRSA colonization surveillance program. It is not intended to diagnose MRSA infection nor to guide or monitor treatment for MRSA infections.          Radiology Studies: Dg Swallowing Func-speech Pathology  Result Date: 01/06/2017 Objective Swallowing Evaluation: Type of Study: MBS-Modified Barium Swallow Study Patient Details Name: Tyler Holland MRN: [id number] Date of Birth: 03-20-1946 Today's Date: 01/06/2017 Time: SLP Start Time (ACUTE ONLY): 1417-SLP Stop Time (ACUTE ONLY): 1430 SLP Time Calculation (min) (ACUTE ONLY): 13 min Past Medical History: Past Medical History: Diagnosis Date . A-fib ([place])  . Atrial flutter ([place])  . Cancer ([place])   behind L ear . Hypertension  . Prostate cancer ([place]) 2002 . Status post radiation 09/2011 Past Surgical History: Past Surgical History: Procedure Laterality Date . PROSTATECTOMY  2002 HPI: 71 y.o. gentleman with a history of head and neck cancer (diagnosed in 2012, treated in [place] with chemotherapy and radiation, details in dental clinic visit progress note from 11/25/16 by Dr. Enrique Sack), prostate cancer, HTN, and atrial fibrillation/flutter (CHADS-Vasc score of 3, he is not anticoagulated) who presents to the ED on 1/24/18for evaluation after having two falls at home on Saturday. The patient reports that he has had two distinct episodes of acute onset, involuntary, uncontrollable left leg  tremor that ultimately causes him to fall. Both times, he has had increased difficulty getting back on his feet, reporting that it takes him 30 minutes to "recover". He denies LOC or associated confusion/post-ictal state. Likely secondary to probable TIA in the setting of high-grade carotid lesion/carotid stenosis. 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 States that  he has dysphagia over the past 2 months and severe pain in him mouth. CXR 1/26: Interval development of mild increase of the interstitial markings and patchy airspace consolidation in right more than left lower lobe. This may represent multifocal pneumonia or asymmetric interstitial pulmonary edema. Note that patient seen by Dentist on 12/18 who noted Oral mass involving lower right quadrant. Patient given options including possible biopsy but has not made a decision to proceed.  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 He has weak lingual propulsion and incomplete velopharyngeal closure that allows for nasal regurgitation of all consistencies. This mostly clears after the swallow as nasopharyngeal residue spills downward into the pharynx, but larger straw sips do travel deeper through the nasal cavity. He has a delayed swallow trigger that results in flash penetration of thin liquids via straw as well. Pt has decreased ability to establish intraoral pressure to assist in  bolus propulsion through the pharynx, resulting in moderate, diffuse residue post-swallow. This can be reduced with a cued second swallow, but attempt at a chin tuck results in increased nasal regurgitation. Recommend to continue with Dys 1 diet and thin liquids by cup, although with strict control of bolus size and use of second swallow per bolus. Will continue to follow.  Impact on safety and function Mild aspiration risk;Moderate aspiration risk   CHL IP TREATMENT RECOMMENDATION 01/06/2017 Treatment Recommendations Therapy as outlined in treatment plan below   Prognosis 01/06/2017 Prognosis for Safe Diet Advancement Fair Barriers to Reach Goals Other (Comment) Barriers/Prognosis Comment -- CHL IP DIET RECOMMENDATION 01/06/2017 SLP Diet Recommendations Dysphagia 1 (Puree) solids;Thin liquid Liquid Administration via Cup;No straw Medication Administration Crushed with puree Compensations Slow rate;Small sips/bites;Multiple dry swallows after each bite/sip Postural Changes Remain semi-upright after after feeds/meals (Comment);Seated upright at 90 degrees   CHL IP OTHER RECOMMENDATIONS 01/06/2017 Recommended Consults Consider ENT evaluation Oral Care Recommendations Oral care BID Other Recommendations Have oral suction available   CHL IP FOLLOW UP RECOMMENDATIONS 01/06/2017 Follow up Recommendations Skilled Nursing facility   [facility] IP FREQUENCY AND DURATION 01/06/2017 Speech Therapy Frequency (ACUTE ONLY) min 2x/week Treatment Duration 2 weeks      CHL IP ORAL PHASE 01/06/2017 Oral Phase Impaired Oral - Pudding Teaspoon -- Oral - Pudding Cup -- Oral - Honey Teaspoon -- Oral - Honey Cup -- Oral - Nectar Teaspoon -- Oral - Nectar Cup -- Oral - Nectar Straw -- Oral - Thin Teaspoon -- Oral - Thin Cup Decreased velopharyngeal closure;Nasal reflux;Weak lingual manipulation Oral - Thin Straw Decreased velopharyngeal closure;Nasal reflux;Weak lingual manipulation Oral - Puree Decreased velopharyngeal closure;Nasal reflux;Weak lingual  manipulation Oral - Mech Soft -- Oral - Regular -- Oral - Multi-Consistency -- Oral - Pill -- Oral Phase - Comment --  CHL IP PHARYNGEAL PHASE 01/06/2017 Pharyngeal Phase Impaired Pharyngeal- Pudding Teaspoon -- Pharyngeal -- Pharyngeal- Pudding Cup -- Pharyngeal -- Pharyngeal- Honey Teaspoon -- Pharyngeal -- Pharyngeal- Honey Cup -- Pharyngeal -- Pharyngeal- Nectar Teaspoon -- Pharyngeal -- Pharyngeal- Nectar Cup -- Pharyngeal -- Pharyngeal- Nectar Straw -- Pharyngeal -- Pharyngeal- Thin Teaspoon -- Pharyngeal -- Pharyngeal- Thin Cup Delayed swallow initiation-pyriform sinuses;Pharyngeal residue - valleculae;Pharyngeal residue - pyriform Pharyngeal -- Pharyngeal- Thin Straw Delayed swallow initiation-pyriform sinuses;Pharyngeal residue - valleculae;Pharyngeal residue - pyriform;Penetration/Aspiration before swallow Pharyngeal Material enters airway, remains ABOVE vocal cords then ejected out Pharyngeal- Puree  Delayed swallow initiation-pyriform sinuses;Pharyngeal residue - valleculae;Pharyngeal residue - pyriform Pharyngeal -- Pharyngeal- Mechanical Soft -- Pharyngeal -- Pharyngeal- Regular -- Pharyngeal -- Pharyngeal- Multi-consistency -- Pharyngeal -- Pharyngeal- Pill -- Pharyngeal -- Pharyngeal Comment --  CHL IP CERVICAL ESOPHAGEAL PHASE 01/06/2017 Cervical Esophageal Phase WFL Pudding Teaspoon -- Pudding Cup -- Honey Teaspoon -- Honey Cup -- Nectar Teaspoon -- Nectar Cup -- Nectar Straw -- Thin Teaspoon -- Thin Cup -- Thin Straw -- Puree -- Mechanical Soft -- Regular -- Multi-consistency -- Pill -- Cervical Esophageal Comment -- CHL IP GO 01/02/2017 Functional Assessment Tool Used (No Data) Functional Limitations Other Speech Language Pathology Swallow Current Status [phone]) (None) Swallow Goal Status [id number]) (None) Swallow Discharge Status [id number]) (None) Motor Speech Current Status [id number]) (None) Motor Speech Goal Status [id number]) (None) Motor Speech Goal Status [id number]) (None) Spoken Language Comprehension Current  Status [id number]) (None) Spoken Language Comprehension Goal Status [id number]) (None) Spoken Language Comprehension Discharge Status [id number]) (None) Spoken Language Expression Current Status [phone]) (None) Spoken Language Expression Goal Status [id number]) (None) Spoken Language Expression Discharge Status [phone]) (None) Attention Current Status [id number]) (None) Attention Goal Status [id number]) (None) Attention Discharge Status [id number]) (None) Memory Current Status [id number]) (None) Memory Goal Status [id number]) (None) Memory Discharge Status [id number]) (None) Voice Current Status [id number]) (None) Voice Goal Status [id number]) (None) Voice Discharge Status [id number]) (None) Other Speech-Language Pathology Functional Limitation [phone]) [facility] Other Speech-Language Pathology Functional Limitation Goal Status [id number]) (None) Other Speech-Language Pathology Functional Limitation Discharge Status [phone]) (None) Germain Osgood 01/06/2017, 3:12 PM  Germain Osgood, M.A. CCC-SLP [phone]                  Scheduled Meds: . amiodarone  400 mg Oral TID  . apixaban  5 mg Oral BID  . atorvastatin  10 mg Oral Daily  . digoxin  0.25 mg Oral Daily  . fluticasone  2 spray Each Nare Daily  . guaiFENesin  1,200 mg Oral BID  . isosorbide-hydrALAZINE  1 tablet Oral TID  . loratadine  10 mg Oral Daily  . magnesium oxide  400 mg Oral BID  . mouth rinse  15 mL Mouth Rinse BID  . metoprolol succinate  50 mg Oral Daily  . mometasone-formoterol  2 puff Inhalation BID  . nicotine  21 mg Transdermal Daily  . spironolactone  25 mg Oral Daily   Continuous Infusions:    LOS: 5 days    Time spent: [place], MD [facility] Pager [phone]  If 7PM-7AM, please contact night-coverage [email or web address] Password [email or web address] 01/06/2017, 3:25 PM

## 2017-01-06 NOTE — Care Management Important Message (Signed)
Important Message  Patient Details  Name: Tyler Holland MRN: [id number] Date of Birth: 08-27-1946   Medicare Important Message Given:  Yes    [name] 01/06/2017, 1:44 PM

## 2017-01-06 NOTE — Progress Notes (Signed)
Speech Language Pathology Treatment: Dysphagia  Patient Details Name: Tyler Holland MRN: [id number] DOB: 10/04/1946 Today's Date: 01/06/2017 Time: 0922-0937 SLP Time Calculation (min) (ACUTE ONLY): 15 min  Assessment / Plan / Recommendation Clinical Impression  Tyler Holland was alert and cooperative; complained of previous nasal regurgitation (a few months ago) that has recently improved. Observed with thin liquids via cup/straw with s/s of aspiration noted at bedside such as immediate coughing/throat clearing that improved with removal of straw. No outward signs of aspiration with puree. Recommend MBS (scheduled 01/06/17) to assess pt's swallowing function due to suspected aspiration and complaints of nasal regurgitation. Continue with current diet: thin liquids (no straw), puree, meds crushed in puree with small sips/bites, seated upright.   HPI HPI: 71 y.o. gentleman with a history of head and neck cancer (diagnosed in 2012, treated in [place] with chemotherapy and radiation, details in dental clinic visit progress note from 11/25/16 by Dr. Enrique Sack), prostate cancer, HTN, and atrial fibrillation/flutter (CHADS-Vasc score of 3, he is not anticoagulated) who presents to the ED on 1/24/18for evaluation after having two falls at home on Saturday. The patient reports that he has had two distinct episodes of acute onset, involuntary, uncontrollable left leg tremor that ultimately causes him to fall. Both times, he has had increased difficulty getting back on his feet, reporting that it takes him 30 minutes to "recover". He denies LOC or associated confusion/post-ictal state. Likely secondary to probable TIA in the setting of high-grade carotid lesion/carotid stenosis. 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 States that he has dysphagia over the past 2 months and severe pain in him mouth. CXR 1/26: Interval development of mild increase of the interstitial markings and patchy airspace consolidation in right more than left lower lobe. This may represent multifocal pneumonia or asymmetric interstitial pulmonary edema. Note that patient seen by Dentist on 12/18 who noted Oral mass involving lower right quadrant. Patient given options including possible biopsy but has not made a decision to proceed.       SLP Plan  MBS     Recommendations  Diet recommendations: Dysphagia 1 (puree);Thin liquid Liquids provided via: Cup;No straw Medication Administration: Crushed with puree Supervision: Full supervision/cueing for compensatory strategies Compensations: Slow rate;Small sips/bites Postural Changes and/or Swallow Maneuvers: Seated upright 90 degrees                Oral Care Recommendations: Oral care BID Plan: MBS       GO                Tyler Holland, Student-SLP 01/06/2017, 10:26 AM

## 2017-01-07 LAB — VAS US CAROTID
LCCAPSYS: 72 cm/s
LEFT ECA DIAS: -15 cm/s
LEFT VERTEBRAL DIAS: -19 cm/s
Left CCA dist dias: -27 cm/s
Left CCA dist sys: -81 cm/s
Left CCA prox dias: 22 cm/s
Left ICA dist dias: -30 cm/s
Left ICA dist sys: -69 cm/s
Left ICA prox dias: 24 cm/s
Left ICA prox sys: 69 cm/s
RIGHT ECA DIAS: -35 cm/s
RIGHT VERTEBRAL DIAS: 11 cm/s
Right CCA prox dias: 12 cm/s
Right CCA prox sys: 47 cm/s
Right cca dist sys: 0 cm/s

## 2017-01-07 LAB — BASIC METABOLIC PANEL
ANION GAP: 8 (ref 5–15)
BUN: 13 mg/dL (ref 6–20)
CHLORIDE: 98 mmol/L — AB (ref 101–111)
CO2: 28 mmol/L (ref 22–32)
Calcium: 8.7 mg/dL — ABNORMAL LOW (ref 8.9–10.3)
Creatinine, Ser: 0.64 mg/dL (ref 0.61–1.24)
GFR calc Af Amer: >60 mL/min (ref >60)
GFR calc non Af Amer: >60 mL/min (ref >60)
Glucose, Bld: 97 mg/dL (ref 65–99)
POTASSIUM: 3.9 mmol/L (ref 3.5–5.1)
SODIUM: 134 mmol/L — AB (ref 135–145)

## 2017-01-07 LAB — MAGNESIUM: MAGNESIUM: 1.7 mg/dL (ref 1.7–2.4)

## 2017-01-07 MED ORDER — MAGNESIUM SULFATE 4 GM/100ML IV SOLN
4.0000 g | Freq: Once | INTRAVENOUS | Status: AC
  Administered 2017-01-07: 4 g via INTRAVENOUS
  Filled 2017-01-07: qty 100

## 2017-01-07 MED ORDER — METOPROLOL SUCCINATE ER 25 MG PO TB24
25.0000 mg | ORAL_TABLET | Freq: Two times a day (BID) | ORAL | Status: DC
  Administered 2017-01-07 (×2): 25 mg via ORAL
  Filled 2017-01-07 (×2): qty 1

## 2017-01-07 MED ORDER — AMIODARONE HCL 200 MG PO TABS
200.0000 mg | ORAL_TABLET | Freq: Two times a day (BID) | ORAL | Status: DC
  Administered 2017-01-07 – 2017-01-08 (×3): 200 mg via ORAL
  Filled 2017-01-07 (×2): qty 1

## 2017-01-07 MED ORDER — WHITE PETROLATUM GEL
Status: AC
  Filled 2017-01-07: qty 1

## 2017-01-07 NOTE — Clinical Social Work Note (Signed)
Clinical Social Worker continuing to follow patient and family for support and discharge planning needs.  Patient nephew has chosen [facility] at discharge.  Patient not yet medically ready - facility aware of patient bed choice and will follow up at time of discharge.  CSW remains available for support and to facilitate patient discharge needs once medically stable.  Barbette Or, [place]

## 2017-01-07 NOTE — Progress Notes (Signed)
BP 107/71 after 500 cc bolus. Will continue to monitor.

## 2017-01-07 NOTE — Progress Notes (Signed)
PROGRESS NOTE    Tyler Holland  [id number] DOB: 1946/03/17 DOA: 12/31/2016 PCP: No PCP Per Patient    Brief Narrative:  Patient is a 71 year old gentleman history of head and neck cancer diagnosed in 2012 and treated in [place] with chemotherapy and radiation, history of prostate cancer, hypertension, atrial fibrillation/flutter presented to the ED after 2 falls at home with acute onset of uncontrollable left lower extremity tremor leading to his fall and significant weakness. In the ED patient noted to be in A. fib with RVR and borderline hypotension was initially on IV Cardizem and subsequently transitioned to IV amiodarone. 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 2-D echo done with a EF of 25-30% with global hypokinesis. EEG was negative. Concern for probable TIA and as such stroke workup done. Patient on Eliquis for secondary stroke prevention. Patient also being followed by cardiology for management of atrial fibrillation.   Assessment & Plan:   Principal Problem:   TIA (transient ischemic attack): Probable Active Problems:   Atrial fibrillation with RVR (HCC)   ICAO (internal carotid artery occlusion), right   Dehydration   Nasopharyngeal mass   Transient left leg weakness   Ascending aortic aneurysm (HCC)   Hypomagnesemia   Cardiomyopathy (HCC)   Right carotid artery occlusion   Head and neck cancer (HCC)   Dysphagia   Chest congestion   #1 probable TIA transient left leg tremors/weakness Patient had presented with left leg tremors/weakness. Likely secondary to probable TIA in the setting of high-grade carotid lesion/carotid stenosis. Patient with no further episodes. MRI of the head with an acute occlusion of the right ICA. Patient with risk factors for TIA including A. fib, hypertension, age, history of tobacco use and not on any antiplatelet agents or anticoagulation prior to admission. EEG done was negative. MRA of the head and neck  consistent with right ICA occlusion. 2-D echo with a cardiomyopathy and a EF of 25-30% without any source of emboli. Carotid Dopplers consistent with right ICA occlusive disease on my left ICA with mild amount of plaque without significant stenosis noted. Fasting lipid panel with LDL of 59. Continue Lipitor. Patient has been changed from IV heparin to NOAC Arne [place]) for secondary stroke prevention per neurology. PT/OT pending. Neurology following and appreciate input and recommendations.  #2 A. fib with RVR/afultter 2:1 conduction( CHA2DS2VASC score 6) Patient noted on admission to be in A. fib with RVR and initially placed on a Cardizem drip which was discontinued secondary to hypotension and patient on amiodarone drip. TSH within normal limits. Free T4 1.00. Cardiac enzymes negative 3. 2-D echo with a EF of 25-30% with global hypokinesis with more severe inferior hypokinesis to akinesis, elevated left ventricular filling pressure, dilated aortic root 4.3 cm, mild-to-moderate MR, severe biatrial enlargement, mild TR, RVSP 32 mmHg, dilated IVC. Heart rate fluctuating anywhere from 50s to 110s. Patient currently off amiodarone drip and oral amiodarone and toprol and digoxin. Cardizem drip has been discontinued. Change amiodarone to 200 mg twice daily. Toprol dose has been adjusted per cardiology. Patient has been seen in consultation by cardiology and cardiology following. Patient on eliquis secondary to probable TIA and acute occlusion of right ICA . Replete electrolytes and keep magnesium greater than 2 and potassium greater than 4. Cardiology following and appreciate input and recommendations.  #3 dehydration Improved with hydration. Saline lock IV fluids.  #4 postobstructive right middle ear and mastoid effusions Consulted with ENT, however per nursing note have been from Dr. Constance Holster,  ENTs office had called and recommended outpatient follow-up in the office. It is noted that ENT was aware of patient's MRI  results.  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 Concern for recurrent tumor versus metastatic disease. Patient with a prior history of head and neck cancer involving the left side of his head behind his ear approximately 5 years ago, status post chemoradiation in [place], [place]. Consulted with ENT, however per nursing note have been from Dr. Constance Holster, ENTs office had called and recommended outpatient follow-up in the office. It is noted that ENT was aware of patient's MRI results.   #6 acute complete occlusion of right ICA MRI of head and neck showing a acute appearing complete occlusion of the right ICA from bifurcation to the circle of Willis likely secondary to tumor versus related therapy. Due to concern for probable TIA as patient had presented with left leg tremors and weakness MRA of the neck has been ordered per Neurology recommendations and consistent with right ICA occlusion beginning at the bulb likely recent occlusion and likely secondary to nasopharyngeal mass invading the skull base and carotid canal. Due to concern for TIAs and atrial fibrillation IV heparin was discontinued and patient currently on NOAC, eliquis.. Avoid hypotension. Neurology following and appreciate input and recommendations.   #7 hypotension Likely secondary to hypovolemic hypotension versus cardiac etiology vs infetious etiology (unlikely as patient with no fever and normal white count). Cardiac enzymes negative 3. 2-D echo with a cardiomyopathy with a EF of 25-30%. Urinalysis nitrite negative leukocytes negative. Chest x-ray is negative for any acute infiltrate. Hypotension initially improved. However with management of patient's A. fib and cardiomyopathy patient's blood pressure has been borderline although patient has been asymptomatic. BiDil has been discontinued per cardiology recommendations. Due to patient's recent TIA and open noted carotid hypotension needs to be avoided. Monitor closely.    #8 cardiomyopathy 2-D echo with a EF of 25-30% with global hypokinesis with more severe inferior hypokinesis to akinesis, elevated left ventricular filling pressure, dilated aortic root to 4.3 cm, mild-to-moderate MR, severe biatrial enlargement, mild TR, RVSP 32 mm of hematocrit, dilated IVC. Patient was on IV heparin secondary to acute occlusion of the right ICA. Patient was also on IV Cardizem and IV amiodarone for atrial fibrillation. Fasting lipid panel with LDL of 59. Hemoglobin A1c = 5.5. Continue Lipitor. Patient on oral Toprol-XL per cardiology and dose adjusted. Digoxin added to patient's regimen. IV amiodarone has been transitioned to oral amiodarone per cardiology. Aldactone have been added to patient's regimen per cardiology. BiDil has been discontinued secondary to borderline blood pressure issues. Cardiology following and appreciate input and recommendations.  #9 ascending thoracic aneurysm measuring 4.2 cm/abdominal aortic aneurysm by duplex on 08/16/2015 revealing 4.3 cm distal aneurysm Continue blood pressure management, risk factor modification. Patient on anticoagulation. Outpatient follow-up.  #10 tobacco abuse Tobacco cessation. Continue nicotine patch.  #11 wheezing Patient with some minimal expiratory wheezing 01/03/2017, which has since improved. Patient with an extensive tobacco history. Repeat chest x-ray with mild increase of interstitial markings and patchy airspace consultation right greater than left which may be pneumonia or asymmetric interstitial pulmonary edema. Patient is afebrile. Patient with no significant respiratory symptoms. Patient does not have a leukocytosis. Highly doubt if this is a clinical pneumonia. Patient got a dose of Lasix 20 mg IV 1 on 01/04/2017, with a urine output of 2.5 L with clinical improvement. No further wheezing noted. Continue Xopenex and Atrovent scheduled nebulizers,Dulera, Claritin, Flonase, Mucinex.  #  12 dysphagia Patient  currently on a dysphagia 1 diet. Patient has been assessed by speech therapy.   #13 mouth pain Patient has been evaluated by dentistry, Dr. Enrique Sack on 11/25/2016 at that time is noted that patient does have a history of acute pulpitis symptoms, chronic apical periodontitis, dental caries, multiple retained roots segments, chronic periodontitis with bone loss, tooth mobility, gingival recession, multiple missing teeth. Options were presented to the patient by dentistry at the time however patient refused the options and stated he will contact dental medicine when he was ready to proceed with any kind of dental treatment. Viscous lidocaine as needed. Outpatient follow-up.     DVT prophylaxis: eliquis Code Status: DNR Family Communication: updated patient. No family at bedside. Disposition Plan: SNF once heart rate is controlled for his atrial fibrillation and BP stable and per cardiology, hopefully in 1-2 days.   Consultants:   Cardiology Dr. [place] 01/01/2017>>>> Dr. Irven Shelling patient  Neurology: Dr. Shon Hale 01/01/2017  Procedures:   CT head 12/31/2016  CT soft tissue neck 12/31/2016  MRI head 01/01/2017  MRI neck 01/01/2017  Chest x-ray 12/31/2016  Plain films of the left wrist 12/31/2016  2-D echo 01/01/2017  EEG 01/01/2017  MRA neck 01/02/2017  Carotid Dopplers 01/02/2017  Antimicrobials:   None   Subjective: Patient states improvement with congestion. No shortness of breath. No chest pain.  Objective: Vitals:   01/06/17 2140 01/07/17 0152 01/07/17 0619 01/07/17 1050  BP: 104/68 93/79 108/86 96/84  Pulse: 75 98 (!) 116 (!) 126  Resp: 18 18 18 17   Temp: 98.2 F (36.8 C) 98.3 F (36.8 C) 98 F (36.7 C) 98.5 F (36.9 C)  TempSrc: Oral Oral Oral Oral  SpO2: 94% 93% 93% 94%  Weight:      Height:        Intake/Output Summary (Last 24 hours) at 01/07/17 1646 Last data filed at 01/07/17 0153  Gross per 24 hour  Intake                0 ml  Output               150 ml  Net             -150 ml   Filed Weights   01/05/17 0340 01/05/17 2224 01/06/17 0500  Weight: 87 kg (191 lb 14.4 oz) 85.2 kg (187 lb 14.4 oz) 85.5 kg (188 lb 7.9 oz)    Examination:  General exam: Appears calm and comfortable  Respiratory system: ctab. Respiratory effort normal. Cardiovascular system: Irregularly irregular. No JVD, murmurs, rubs, gallops or clicks. No pedal edema. Gastrointestinal system: Abdomen is nondistended, soft and nontender. No organomegaly or masses felt. Normal bowel sounds heard. Central nervous system: Alert and oriented. No focal neurological deficits. Extremities: Symmetric 5 x 5 power. Skin: No rashes, lesions or ulcers Psychiatry: Judgement and insight appear normal. Mood & affect appropriate.     Data Reviewed: I have personally reviewed following labs and imaging studies  CBC:  Recent Labs Lab 01/01/17 0934 01/02/17 0250 01/03/17 0308 01/04/17 0520 01/05/17 0207 01/06/17 0137  WBC 7.1 6.3 6.3 6.7 6.7 5.8  NEUTROABS 5.8  --   --   --   --   --   HGB 15.4 14.4 14.0 14.3 14.8 14.9  HCT 45.2 43.2 42.1 41.8 43.9 44.5  MCV 99.8 100.0 99.5 99.1 99.1 99.1  PLT 152 156 149* 152 175 [id number]   Basic Metabolic Panel:  Recent Labs Lab 01/03/17 0308 01/04/17  [id number] 01/05/17 0207 01/06/17 0137 01/07/17 0222  NA 134* 133* 133* 133* 134*  K 3.6 3.5 3.5 4.0 3.9  CL 101 99* 97* 98* 98*  CO2 26 27 25 26 28   GLUCOSE 101* 98 104* 105* 97  BUN 7 6 10 11 13   CREATININE 0.70 0.64 0.66 0.69 0.64  CALCIUM 8.5* 8.5* 8.5* 8.8* 8.7*  MG 1.7 1.5* 1.6* 1.7 1.7   GFR: Estimated Creatinine Clearance: 88.7 mL/min (by C-G formula based on SCr of 0.64 mg/dL). Liver Function Tests:  Recent Labs Lab 01/01/17 0934  AST 21  ALT 15*  ALKPHOS 60  BILITOT 1.0  PROT 5.5*  ALBUMIN 2.7*   No results for input(s): LIPASE, AMYLASE in the last 168 hours. No results for input(s): AMMONIA in the last 168 hours. Coagulation Profile: No results for  input(s): INR, PROTIME in the last 168 hours. Cardiac Enzymes:  Recent Labs Lab 12/31/16 1850 01/01/17 0934 01/01/17 1516 01/01/17 2119  CKTOTAL 80  --   --   --   TROPONINI 0.03* <0.03 <0.03 <0.03   BNP (last 3 results) No results for input(s): PROBNP in the last 8760 hours. HbA1C: No results for input(s): HGBA1C in the last 72 hours. CBG: No results for input(s): GLUCAP in the last 168 hours. Lipid Profile: No results for input(s): CHOL, HDL, LDLCALC, TRIG, CHOLHDL, LDLDIRECT in the last 72 hours. Thyroid Function Tests: No results for input(s): TSH, T4TOTAL, FREET4, T3FREE, THYROIDAB in the last 72 hours. Anemia Panel: No results for input(s): VITAMINB12, FOLATE, FERRITIN, TIBC, IRON, RETICCTPCT in the last 72 hours. Sepsis Labs:  Recent Labs Lab 12/31/16 1917  LATICACIDVEN 1.76    Recent Results (from the past 240 hour(s))  MRSA PCR Screening     Status: None   Collection Time: 01/01/17  5:10 PM  Result Value Ref Range Status   MRSA by PCR NEGATIVE NEGATIVE Final    Comment:        The GeneXpert MRSA Assay (FDA approved for NASAL specimens only), is one component of a comprehensive MRSA colonization surveillance program. It is not intended to diagnose MRSA infection nor to guide or monitor treatment for MRSA infections.          Radiology Studies: Dg Swallowing Func-speech Pathology  Result Date: 01/06/2017 Objective Swallowing Evaluation: Type of Study: MBS-Modified Barium Swallow Study Patient Details Name: Vahan Shindler MRN: [id number] Date of Birth: 09/10/1946 Today's Date: 01/06/2017 Time: SLP Start Time (ACUTE ONLY): 1417-SLP Stop Time (ACUTE ONLY): 1430 SLP Time Calculation (min) (ACUTE ONLY): 13 min Past Medical History: Past Medical History: Diagnosis Date . A-fib ([place])  . Atrial flutter ([place])  . Cancer ([place])   behind L ear . Hypertension  . Prostate cancer ([place]) 2002 . Status post radiation 09/2011 Past Surgical History: Past Surgical History:  Procedure Laterality Date . PROSTATECTOMY  2002 HPI: 72 y.o. gentleman with a history of head and neck cancer (diagnosed in 2012, treated in [place] with chemotherapy and radiation, details in dental clinic visit progress note from 11/25/16 by Dr. Enrique Sack), prostate cancer, HTN, and atrial fibrillation/flutter (CHADS-Vasc score of 3, he is not anticoagulated) who presents to the ED on 1/24/18for evaluation after having two falls at home on Saturday. The patient reports that he has had two distinct episodes of acute onset, involuntary, uncontrollable left leg tremor that ultimately causes him to fall. Both times, he has had increased difficulty getting back on his feet, reporting that it takes him 30 minutes to "recover". He denies LOC  or associated confusion/post-ictal state. Likely secondary to probable TIA in the setting of high-grade carotid lesion/carotid stenosis. 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 States that he has dysphagia over the past 2 months and severe pain in him mouth. CXR 1/26: Interval development of mild increase of the interstitial markings and patchy airspace consolidation in right more than left lower lobe. This may represent multifocal pneumonia or asymmetric interstitial pulmonary edema. Note that patient seen by Dentist on 12/18 who noted Oral mass involving lower right quadrant. Patient given options including possible biopsy but has not made a decision to proceed.  Subjective: Pt cooperative/pleasant Assessment / Plan / Recommendation CHL IP CLINICAL IMPRESSIONS 01/06/2017 Therapy Diagnosis Moderate oral phase dysphagia;Moderate pharyngeal phase dysphagia  Clinical  Impression Pt has a moderate oropharyngeal dysphagia, suspect secondary to presence of nasopharyngeal mass. He has weak lingual propulsion and incomplete velopharyngeal closure that allows for nasal regurgitation of all consistencies. This mostly clears after the swallow as nasopharyngeal residue spills downward into the pharynx, but larger straw sips do travel deeper through the nasal cavity. He has a delayed swallow trigger that results in flash penetration of thin liquids via straw as well. Pt has decreased ability to establish intraoral pressure to assist in bolus propulsion through the pharynx, resulting in moderate, diffuse residue post-swallow. This can be reduced with a cued second swallow, but attempt at a chin tuck results in increased nasal regurgitation. Recommend to continue with Dys 1 diet and thin liquids by cup, although with strict control of bolus size and use of second swallow per bolus. Will continue to follow.  Impact on safety and function Mild aspiration risk;Moderate aspiration risk   CHL IP TREATMENT RECOMMENDATION 01/06/2017 Treatment Recommendations Therapy as outlined in treatment plan below   Prognosis 01/06/2017 Prognosis for Safe Diet Advancement Fair Barriers to Reach Goals Other (Comment) Barriers/Prognosis Comment -- CHL IP DIET RECOMMENDATION 01/06/2017 SLP Diet Recommendations Dysphagia 1 (Puree) solids;Thin liquid Liquid Administration via Cup;No straw Medication Administration Crushed with puree Compensations Slow rate;Small sips/bites;Multiple dry swallows after each bite/sip Postural Changes Remain semi-upright after after feeds/meals (Comment);Seated upright at 90 degrees   CHL IP OTHER RECOMMENDATIONS 01/06/2017 Recommended Consults Consider ENT evaluation Oral Care Recommendations Oral care BID Other Recommendations Have oral suction available   CHL IP FOLLOW UP RECOMMENDATIONS 01/06/2017 Follow up Recommendations Skilled Nursing facility   [facility] IP FREQUENCY AND DURATION  01/06/2017 Speech Therapy Frequency (ACUTE ONLY) min 2x/week Treatment Duration 2 weeks      CHL IP ORAL PHASE 01/06/2017 Oral Phase Impaired Oral - Pudding Teaspoon -- Oral - Pudding Cup -- Oral - Honey Teaspoon -- Oral - Honey Cup -- Oral - Nectar Teaspoon -- Oral - Nectar Cup -- Oral - Nectar Straw -- Oral - Thin Teaspoon -- Oral - Thin Cup Decreased velopharyngeal closure;Nasal reflux;Weak lingual manipulation Oral - Thin Straw Decreased velopharyngeal closure;Nasal reflux;Weak lingual manipulation Oral - Puree Decreased velopharyngeal closure;Nasal reflux;Weak lingual manipulation Oral - Mech Soft -- Oral - Regular -- Oral - Multi-Consistency -- Oral - Pill -- Oral Phase - Comment --  CHL IP PHARYNGEAL PHASE 01/06/2017 Pharyngeal Phase Impaired Pharyngeal- Pudding Teaspoon -- Pharyngeal -- Pharyngeal- Pudding Cup -- Pharyngeal -- Pharyngeal- Honey Teaspoon -- Pharyngeal --  Pharyngeal- Honey Cup -- Pharyngeal -- Pharyngeal- Nectar Teaspoon -- Pharyngeal -- Pharyngeal- Nectar Cup -- Pharyngeal -- Pharyngeal- Nectar Straw -- Pharyngeal -- Pharyngeal- Thin Teaspoon -- Pharyngeal -- Pharyngeal- Thin Cup Delayed swallow initiation-pyriform sinuses;Pharyngeal residue - valleculae;Pharyngeal residue - pyriform Pharyngeal -- Pharyngeal- Thin Straw Delayed swallow initiation-pyriform sinuses;Pharyngeal residue - valleculae;Pharyngeal residue - pyriform;Penetration/Aspiration before swallow Pharyngeal Material enters airway, remains ABOVE vocal cords then ejected out Pharyngeal- Puree Delayed swallow initiation-pyriform sinuses;Pharyngeal residue - valleculae;Pharyngeal residue - pyriform Pharyngeal -- Pharyngeal- Mechanical Soft -- Pharyngeal -- Pharyngeal- Regular -- Pharyngeal -- Pharyngeal- Multi-consistency -- Pharyngeal -- Pharyngeal- Pill -- Pharyngeal -- Pharyngeal Comment --  CHL IP CERVICAL ESOPHAGEAL PHASE 01/06/2017 Cervical Esophageal Phase WFL Pudding Teaspoon -- Pudding Cup -- Honey Teaspoon -- Honey Cup --  Nectar Teaspoon -- Nectar Cup -- Nectar Straw -- Thin Teaspoon -- Thin Cup -- Thin Straw -- Puree -- Mechanical Soft -- Regular -- Multi-consistency -- Pill -- Cervical Esophageal Comment -- CHL IP GO 01/02/2017 Functional Assessment Tool Used (No Data) Functional Limitations Other Speech Language Pathology Swallow Current Status [phone]) (None) Swallow Goal Status [id number]) (None) Swallow Discharge Status [id number]) (None) Motor Speech Current Status [id number]) (None) Motor Speech Goal Status [id number]) (None) Motor Speech Goal Status [id number]) (None) Spoken Language Comprehension Current Status [id number]) (None) Spoken Language Comprehension Goal Status [id number]) (None) Spoken Language Comprehension Discharge Status [id number]) (None) Spoken Language Expression Current Status [phone]) (None) Spoken Language Expression Goal Status [id number]) (None) Spoken Language Expression Discharge Status [phone]) (None) Attention Current Status [id number]) (None) Attention Goal Status [id number]) (None) Attention Discharge Status [id number]) (None) Memory Current Status [id number]) (None) Memory Goal Status [id number]) (None) Memory Discharge Status [id number]) (None) Voice Current Status [id number]) (None) Voice Goal Status [id number]) (None) Voice Discharge Status [id number]) (None) Other Speech-Language Pathology Functional Limitation [phone]) [facility] Other Speech-Language Pathology Functional Limitation Goal Status [id number]) (None) Other Speech-Language Pathology Functional Limitation Discharge Status [phone]) (None) Germain Osgood 01/06/2017, 3:12 PM  Germain Osgood, M.A. CCC-SLP [phone]                  Scheduled Meds: . amiodarone  200 mg Oral BID  . apixaban  5 mg Oral BID  . atorvastatin  10 mg Oral Daily  . digoxin  0.25 mg Oral Daily  . fluticasone  2 spray Each Nare Daily  . guaiFENesin  1,200 mg Oral BID  . loratadine  10 mg Oral Daily  . magnesium oxide  400 mg Oral BID  . mouth rinse  15 mL Mouth Rinse BID  . metoprolol succinate  25 mg Oral BID  .  mometasone-formoterol  2 puff Inhalation BID  . nicotine  21 mg Transdermal Daily  . spironolactone  25 mg Oral Daily  . white petrolatum       Continuous Infusions:    LOS: 6 days    Time spent: [place], MD [facility] Pager [phone]  If 7PM-7AM, please contact night-coverage [email or web address] Password [email or web address] 01/07/2017, 4:46 PM

## 2017-01-08 DIAGNOSIS — E860 Dehydration: Secondary | ICD-10-CM

## 2017-01-08 DIAGNOSIS — R1310 Dysphagia, unspecified: Secondary | ICD-10-CM

## 2017-01-08 DIAGNOSIS — I4891 Unspecified atrial fibrillation: Secondary | ICD-10-CM

## 2017-01-08 DIAGNOSIS — I429 Cardiomyopathy, unspecified: Secondary | ICD-10-CM

## 2017-01-08 LAB — CBC
HCT: 46.5 % (ref 39.0–52.0)
Hemoglobin: 15.8 g/dL (ref 13.0–17.0)
MCH: 33.5 pg (ref 26.0–34.0)
MCHC: 34 g/dL (ref 30.0–36.0)
MCV: 98.7 fL (ref 78.0–100.0)
Platelets: 289 10*3/uL (ref 150–400)
RBC: 4.71 MIL/uL (ref 4.22–5.81)
RDW: 12.9 % (ref 11.5–15.5)
WBC: 7.2 10*3/uL (ref 4.0–10.5)

## 2017-01-08 LAB — BASIC METABOLIC PANEL
Anion gap: 8 (ref 5–15)
BUN: 9 mg/dL (ref 6–20)
CALCIUM: 8.7 mg/dL — AB (ref 8.9–10.3)
CO2: 25 mmol/L (ref 22–32)
CREATININE: 0.66 mg/dL (ref 0.61–1.24)
Chloride: 95 mmol/L — ABNORMAL LOW (ref 101–111)
GFR calc Af Amer: >60 mL/min (ref >60)
GLUCOSE: 97 mg/dL (ref 65–99)
Potassium: 4.2 mmol/L (ref 3.5–5.1)
Sodium: 128 mmol/L — ABNORMAL LOW (ref 135–145)

## 2017-01-08 LAB — MAGNESIUM: MAGNESIUM: 1.6 mg/dL — AB (ref 1.7–2.4)

## 2017-01-08 MED ORDER — AMIODARONE HCL 200 MG PO TABS: 200.0000 mg | ORAL_TABLET | Freq: Two times a day (BID) | ORAL | 0 refills | Status: AC

## 2017-01-08 MED ORDER — HYDROCODONE-ACETAMINOPHEN 7.5-325 MG PO TABS: 0.5000 | ORAL_TABLET | Freq: Four times a day (QID) | ORAL | 0 refills | Status: AC | PRN

## 2017-01-08 MED ORDER — METOPROLOL SUCCINATE ER 25 MG PO TB24
25.0000 mg | ORAL_TABLET | Freq: Two times a day (BID) | ORAL | Status: DC
  Administered 2017-01-08: 25 mg via ORAL
  Filled 2017-01-08: qty 1

## 2017-01-08 MED ORDER — APIXABAN 5 MG PO TABS: 5.0000 mg | ORAL_TABLET | Freq: Two times a day (BID) | ORAL | 0 refills | Status: AC

## 2017-01-08 MED ORDER — CLONAZEPAM 0.5 MG PO TABS: 0.5000 mg | ORAL_TABLET | Freq: Two times a day (BID) | ORAL | 0 refills | Status: DC | PRN

## 2017-01-08 MED ORDER — DIGOXIN 250 MCG PO TABS: 0.2500 mg | ORAL_TABLET | Freq: Every day | ORAL | 0 refills | Status: AC

## 2017-01-08 MED ORDER — SPIRONOLACTONE 25 MG PO TABS: 25.0000 mg | ORAL_TABLET | Freq: Every day | ORAL | 0 refills | Status: AC

## 2017-01-08 MED ORDER — METOPROLOL SUCCINATE ER 25 MG PO TB24: 25.0000 mg | ORAL_TABLET | Freq: Two times a day (BID) | ORAL | 0 refills | Status: DC

## 2017-01-08 NOTE — Progress Notes (Signed)
Subjective:  Patient is feeling better. No specific complaints. No clinical e/o CHF. Wants to walk. Slept flat without dyspnea or any discomfort.   Objective:  Vital Signs in the last 24 hours: Temp:  [98.4 F (36.9 C)-98.6 F (37 C)] 98.6 F (37 C) (01/31 0510) Pulse Rate:  [60-126] 94 (01/31 0510) Resp:  [17-18] 18 (01/31 0510) BP: (96-121)/(75-89) 100/75 (01/31 0510) SpO2:  [91 %-94 %] 93 % (01/31 0510) Weight:  [188 lb 11.2 oz (85.6 kg)] 188 lb 11.2 oz (85.6 kg) (01/31 0510)  Intake/Output from previous day: 01/30 0701 - 01/31 0700 In: -  Out: 4000 [Urine:4000]  Physical Exam: General appearance: alert, cooperative, appears older than stated age, fatigued and no distress. Ill appearing Eyes: negative findings: lids and lashes normal and conjunctivae and sclerae normal Neck: no carotid bruit, no JVD, thyroid not enlarged, symmetric, no tenderness/mass/nodules and Hard skin of the neck from prior RT and ? mass submandibular region right. Halitosis present  Neck: JVP - normal, carotids without bruits Resp: clear to auscultation bilaterally Chest wall: no tenderness Cardio: Tachycardia present.  Irregular, S1 variable, S2 normal, no murmur, click, rub or gallop and tahycardia present GI: soft, non-tender; bowel sounds normal; no masses,  no organomegaly Extremities: extremities normal, atraumatic, no cyanosis or edema Lab Results: BMP  Recent Labs  01/06/17 0137 01/07/17 0222 01/08/17 0718  NA 133* 134* 128*  K 4.0 3.9 4.2  CL 98* 98* 95*  CO2 26 28 25   GLUCOSE 105* 97 97  BUN 11 13 9   CREATININE 0.69 0.64 0.66  CALCIUM 8.8* 8.7* 8.7*  GFRNONAA >60 >60 >60  GFRAA >60 >60 >60    CBC  Recent Labs Lab 01/08/17 0718  WBC 7.2  RBC 4.71  HGB 15.8  HCT 46.5  PLT 289  MCV 98.7  MCH 33.5  MCHC 34.0  RDW 12.9    HEMOGLOBIN A1C Lab Results  Component Value Date   HGBA1C 5.7 (H) 01/03/2017   MPG 117 01/03/2017    Recent Labs  12/31/16 1850  01/01/17 0934 01/01/17 1516 01/01/17 2119  CKTOTAL 80  --   --   --   TROPONINI 0.03* <0.03 <0.03 <0.03    Recent Labs  01/01/17 0631  TSH 2.810    CHOLESTEROL  Recent Labs  01/01/17 1507  CHOL 105    Hepatic Function Panel  Recent Labs  12/31/16 1441 01/01/17 0934  PROT 6.8 5.5*  ALBUMIN 3.2* 2.7*  AST 22 21  ALT 15* 15*  ALKPHOS 75 60  BILITOT 1.3* 1.0    Imaging: Imaging results have been reviewed  Cardiac Studies: EKG 01/01/2017: Atrial flutter with 2:1 conduction at the rate of 135 bpm.  No evidence of ischemia.  Normal QT interval.  Tele 01/03/16: A. Fibrillation with RVR, HR better controlled.  Echocardiogram 01/02/2016: Normal LV size, marked global hypokinesis, severe LV systolic dysfunction, EF [id number] with grade 2 diastolic dysfunction.  Mild aortic root dilatation at 43 mm.  Mild to moderate MR.  Mild RV dilatation.  Moderate to back percent.  Moderately reduced RV systolic function.  Mild pulmonary hypertension.  PA pressure 32 mmHg.  IVC dilated with blunted respiratory response.  Echocardiogram 04/2014(not my office, but outside) : Left ventricular size is mildly increased.  Wall thickness is mildly increased.  LVEF 50-55%.  Borderline global hypokinesis.  Mitral annulus is mildly calcified.  Leaflets are mildly thickened.  Systolic bowing without prolapse.  Mild, eccentric regurgitation.  Left atrium mildly dilated.  Carotid  artery duplex 01/02/2017: Right ICA demonstrates nonocclusive disease, mild plaque left ICA.  Antegrade bilateral vertebral artery flow.  Out patient Abdominal aortic duplex 08/16/2015: Moderate dilatation of the abdominal aorta is noted in the mid and distal aorta. Diffuse plaque noted in the distal aorta. An abdominal aortic aneurysm measuring 4.28 x 4.38 x 4.48 cm is seen.  Scheduled Meds: . amiodarone  200 mg Oral BID  . apixaban  5 mg Oral BID  . atorvastatin  10 mg Oral Daily  . digoxin  0.25 mg Oral Daily  .  fluticasone  2 spray Each Nare Daily  . guaiFENesin  1,200 mg Oral BID  . loratadine  10 mg Oral Daily  . magnesium oxide  400 mg Oral BID  . mouth rinse  15 mL Mouth Rinse BID  . metoprolol succinate  25 mg Oral BID  . mometasone-formoterol  2 puff Inhalation BID  . nicotine  21 mg Transdermal Daily  . spironolactone  25 mg Oral Daily   Continuous Infusions:  PRN Meds:.acetaminophen **OR** acetaminophen (TYLENOL) oral liquid 160 mg/5 mL **OR** acetaminophen, clonazePAM, HYDROcodone-acetaminophen, hydrOXYzine, levalbuterol, lidocaine, ondansetron **OR** ondansetron (ZOFRAN) IV, senna-docusate   Assessment/Plan:  1.  Atrial flutter with 2:1 conduction. Now in A. Fibrillation with RVR, heart rate still better controlled.  Presently on  Eliquis. History of ablation in 05/12/2012 in [place]. CHA2DS2-VASCScore: Risk Score  6,  Yearly risk of stroke  9.8.  2.  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 3.  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 Acute on chronic systolic and diastolic heart failure.  Told to have normal coronary arteries in 2014 in [place].  May be related to atrial flutter with RVR, alcohol may be contributiing. Eccho in 2015 LVEF  55%. 4.  History of alcoholic abuse, tobacco use disorder, now drinks about 3 ounces of vodka per day and smokes about half a pack of cigarettes per day. 5. Hypertension now hypotensive 6.  Hyperlipidemia 7.  Severe malnutrition with reduced serum albumin, serum BUN. 8.  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 9.  Abdominal aortic aneurysm by aortic duplex on 08/16/15 revealing 4.3 cm distal aneurysm.  Recommendation:  Do not hold Metoprolol unless SBP < 80 or patient is symptomatic with dizziness. Ambulate. He looks great, no clinical e.o acute decompensated HF. HR better controlled. I think he is stable for SNF with reduced Amil at 200 mg BID and OP f/u wit  me. 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    Adrian Prows, M.D. 01/08/2017, 9:44 AM [facility] Pager: [phone] Office: [phone] If no answer: [phone]

## 2017-01-08 NOTE — Progress Notes (Signed)
Patient discharged to [facility].  Discharge instructions and prescriptions given. IV removed, telemetry discontinued. Report called to facility.  Patient to transport to facility via family care. Assisted to discharge area via wheelchair with staff. Wendee Copp

## 2017-01-08 NOTE — Progress Notes (Signed)
Physical Therapy Treatment Patient Details Name: Tyler Holland MRN: [id number] DOB: [birth year] Today's Date: 01/08/2017    History of Present Illness  Tyler Holland is a 71 y.o. gentleman with a history of head and neck cancer (diagnosed in 2012), prostate cancer, HTN, and atrial fibrillation/flutter presented to ED wtih Left leg "quivering", weakness, recurrent falls x2. 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    PT Comments    Patient able to ambulate with min A due to L lateral lean; pt able to follow commands and can correct posture with cuing but unable to maintain. Needs cuing throughout dynamic standing to prevent falling. He would benefit from static and dynamic standing balance with emphasis on bearing weight through R side. Pt to be discharged to SNF to improve functional independence and safety with mobility prior to going home.    Follow Up Recommendations  SNF     Equipment Recommendations   (TBA at next venue)    Recommendations for Other Services       Precautions / Restrictions Precautions Precautions: Fall Precaution Comments: leans heavy to L in standing Restrictions Weight Bearing Restrictions: No    Mobility  Bed Mobility Overal bed mobility: Needs Assistance Bed Mobility: Supine to Sit;Sit to Supine     Supine to sit: Min assist Sit to supine: Min assist   General bed mobility comments: Min assist for trunk elevation and cuing for scooting hips to EOB.  Min A for sit to supine for safety during deceleration due to leg weakness.   Transfers Overall transfer level: Needs assistance Equipment used: Rolling walker (2 wheeled) Transfers: Sit to/from Stand Sit to Stand: Min assist          General transfer comment: MIn assist to power trunk up from EOB. Pt requires multiple verbal cues for proper hand placement and requires increased time to initiate task.   Ambulation/Gait Ambulation/Gait assistance: Min assist Ambulation Distance (Feet): 75 Feet (+ 20 + 55) Assistive device: Rolling walker (2 wheeled) Gait Pattern/deviations: Step-through pattern;Decreased stride length;Decreased weight shift to right;Trunk flexed Gait velocity: Decreased, unsteady   General Gait Details: Pt reports L LE feels unsteady when walking but no LOB noted; two standing rest breaks due to fatigue. Requires min A and cuing to maintain upright posture and shift to right side. Able to correct trunk and head posture but can't maintain for long period.   Stairs            Wheelchair Mobility    Modified Rankin (Stroke Patients Only) Modified Rankin (Stroke Patients Only) Pre-Morbid Rankin Score: No significant disability Modified Rankin: Moderately severe disability     Balance Overall balance assessment: Needs assistance Sitting-balance support: Bilateral upper extremity supported Sitting balance-Leahy Scale: Good     Standing balance support: Bilateral upper extremity supported;During functional activity Standing balance-Leahy Scale: Fair Standing balance comment: Able to stand statically without UE support and min guard. Pt reliant on RW during functional activitiy                    Cognition Arousal/Alertness: Awake/alert Behavior During Therapy: Agitated   Area of Impairment: Problem solving;Safety/judgement         Safety/Judgement: Decreased awareness of safety   Problem Solving: Slow processing General Comments: pt agitated  when SPTA adjusted telemetry but otherwise follows commands and communicates appropriately. When asked, pt states he is aware of L lateral lean but requires cuing to correct it.     Exercises      General Comments        Pertinent  Vitals/Pain Faces Pain Scale: Hurts a little bit Pain Location: mouth Pain Intervention(s): Monitored during session    Home Living                      Prior Function            PT Goals (current goals can now be found in the care plan section) Acute Rehab PT Goals Patient Stated Goal: none stated PT Goal Formulation: With patient Potential to Achieve Goals: Good Progress towards PT goals: Progressing toward goals    Frequency    Min 3X/week      PT Plan Current plan remains appropriate    Co-evaluation             End of Session Equipment Utilized During Treatment: Gait belt Activity Tolerance: Patient tolerated treatment well;Patient limited by fatigue Patient left: in bed;with call bell/phone within reach;with family/visitor present     Time: 1430-1450 PT Time Calculation (min) (ACUTE ONLY): 20 min  Charges:  $Gait Training: 8-22 mins                    G Codes:      [facility] Jan 21, 2017, 3:54 PM [place], [place] Pager [phone]

## 2017-01-08 NOTE — Discharge Summary (Addendum)
Physician Discharge Summary  Tyler Holland [id number] DOB: 1946-09-15 DOA: 12/31/2016  PCP: No PCP Per Patient  Admit date: 12/31/2016 Discharge date: 01/08/2017  Admitted From: Home Disposition:  SNF  Recommendations for Outpatient Follow-up:  1. Follow up with PCP in 2-3 weeks 2. Follow up with Neurology as scheduled in 6 weeks 3. Follow up with ENT, Dr. Constance Holster, as scheduled  [place] controlled substance registry reviewed. 5 day supply of hydrocodone/acetaminophen last prescribed 11/21/16. No other controlled substances listed  Discharge Condition:Stable CODE STATUS:DNR Diet recommendation: Dysphagia 1 with thin liquids   Brief/Interim Summary: 71 year old gentleman history of head and neck cancer diagnosed in 2012 and treated in [place] with chemotherapy and radiation, history of prostate cancer, hypertension, atrial fibrillation/flutter presented to the ED after 2 falls at home with acute onset of uncontrollable left lower extremity tremor leading to his fall and significant weakness. In the ED patient noted to be in A. fib with RVR and borderline hypotension was initially on IV Cardizem and subsequently transitioned to IV amiodarone. 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 2-D echo done with a EF of 25-30% with global hypokinesis. EEG was negative. Concern for probable TIA and as such stroke workup done. Patient on Eliquis for secondary stroke prevention. Patient also being followed by cardiology for management of atrial fibrillation.  #1 probable TIA transient left leg tremors/weakness Patient had presented with left leg tremors/weakness. Likely secondary to probable TIA in the setting of high-grade carotid lesion/carotid stenosis. Patient with no further episodes. MRI of the head with an acute occlusion of the right ICA. Patient with risk factors for TIA including A. fib, hypertension, age, history of tobacco use and not on any antiplatelet agents or  anticoagulation prior to admission. EEG done was negative. MRA of the head and neck consistent with right ICA occlusion. 2-D echo with a cardiomyopathy and a EF of 25-30% without any source of emboli. Carotid Dopplers consistent with right ICA occlusive disease on my left ICA with mild amount of plaque without significant stenosis noted. Fasting lipid panel with LDL of 59. Continue Lipitor. Patient has been changed from IV heparin to NOAC Arne [place]) for secondary stroke prevention per neurology. PT/OT pending. Neurology had been following.  #2 A. fib with RVR/afultter 2:1 conduction( CHA2DS2VASC score 6) Patient noted on admission to be in A. fib with RVR and initially placed on a Cardizem drip which was discontinued secondary to hypotension and patient on amiodarone drip. TSH within normal limits. Free T4 1.00. Cardiac enzymes negative 3. 2-D echo with a EF of 25-30% with global hypokinesis with more severe inferior hypokinesis to akinesis, elevated left ventricular filling pressure, dilated aortic root 4.3 cm, mild-to-moderate MR, severe biatrial enlargement, mild TR, RVSP 32 mmHg, dilated IVC. Heart rate fluctuating anywhere from 50s to 110s. Patient currently off amiodarone drip and oral amiodarone and toprol and digoxin. Cardizem drip has been discontinued. Changed amiodarone to 200 mg twice daily. Toprol dose has been adjusted per cardiology. Patient has been seen in consultation by cardiology and cardiology following. Patient on eliquis secondary to probable TIA and acute occlusion of right ICA . Replete electrolytes and keep magnesium greater than 2 and potassium greater than 4. Cardiology had been following  #3 dehydration Improved with hydration.  #4 postobstructive right middle ear and mastoid effusions Consulted with ENT, however per nursing note have been from Dr. Constance Holster, ENTs office had called and recommended outpatient follow-up in the office. It is noted that ENT was aware  of patient's MRI  results.  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 Concern for recurrent tumor versus metastatic disease. Patient with a prior history of head and neck cancer involving the left side of his head behind his ear approximately 5 years ago, status post chemoradiation in [place], [place]. During this hospital course, had consulted with ENT, however per nursing note have been from Dr. Constance Holster, ENTs office had called and recommended outpatient follow-up in the office. It is noted that ENT was aware of patient's MRI results.   #6 acute complete occlusion of right ICA MRI of head and neck showing a acute appearing complete occlusion of the right ICA from bifurcation to the circle of Willis likely secondary to tumor versus related therapy. Due to concern for probable TIA as patient had presented with left leg tremors and weakness MRA of the neck has been ordered per Neurology recommendations and consistent with right ICA occlusion beginning at the bulb likely recent occlusion and likely secondary to nasopharyngeal mass invading the skull base and carotid canal. Due to concern for TIAs and atrial fibrillation IV heparin was discontinued and patient currently on NOAC, eliquis.. Neurology following and appreciate input and recommendations.   #7 hypotension Likely secondary to hypovolemic hypotension versus cardiac etiology vs infetious etiology (unlikely as patient with no fever and normal white count). Cardiac enzymes negative 3. 2-D echo with a cardiomyopathy with a EF of 25-30%. Urinalysis nitrite negative leukocytes negative. Chest x-ray is negative for any acute infiltrate. Hypotension initially improved. However with management of patient's A. fib and cardiomyopathy patient's blood pressure has been borderline although patient has been asymptomatic. BiDil has been discontinued per cardiology recommendations. Due to patient's recent TIA and open noted carotid hypotension needs to be avoided.  Discussed case with Cardiology who recommends continued ambulation unless sbp <80 or patient symptomatic from hypotension  #8 cardiomyopathy 2-D echo with a EF of 25-30% with global hypokinesis with more severe inferior hypokinesis to akinesis, elevated left ventricular filling pressure, dilated aortic root to 4.3 cm, mild-to-moderate MR, severe biatrial enlargement, mild TR, RVSP 32 mm of hematocrit, dilated IVC. Patient was on IV heparin secondary to acute occlusion of the right ICA. Patient was also on IV Cardizem and IV amiodarone for atrial fibrillation. Fasting lipid panel with LDL of 59. Hemoglobin A1c = 5.5. Continue Lipitor. Patient on oral Toprol-XL per cardiology and dose adjusted. Digoxin added to patient's regimen. IV amiodarone has been transitioned to oral amiodarone per cardiology. Aldactone have been added to patient's regimen per cardiology. BiDil has been discontinued secondary to borderline blood pressure issues. Cardiology following and appreciate input and recommendations.  #9 ascending thoracic aneurysm measuring 4.2 cm/abdominal aortic aneurysm by duplex on 08/16/2015 revealing 4.3 cm distal aneurysm Continue blood pressure management, risk factor modification. Patient on anticoagulation. Outpatient follow-up.  #10 tobacco abuse Tobacco cessation. Continue nicotine patch.  #11 wheezing Patient with some minimal expiratory wheezing 01/03/2017, which has since improved. Patient with an extensive tobacco history. Repeat chest x-ray with mild increase of interstitial markings and patchy airspace consultation right greater than left which may be pneumonia or asymmetric interstitial pulmonary edema. Patient is afebrile. Patient with no significant respiratory symptoms. Patient does not have a leukocytosis. Highly doubt if this is a clinical pneumonia. Patient got a dose of Lasix 20 mg IV 1 on 01/04/2017, with a urine output of 2.5 L with clinical improvement. No further wheezing  noted. Patient had been continued on Xopenex and Atrovent scheduled nebulizers,Dulera,  Claritin, Flonase, Mucinex.  #12 dysphagia Patient has been continued on a dysphagia 1 diet. Patient has been assessed by speech therapy.   #13 mouth pain Patient has been evaluated by dentistry, Dr. Enrique Sack on 11/25/2016 at that time is noted that patient does have a history of acute pulpitis symptoms, chronic apical periodontitis, dental caries, multiple retained roots segments, chronic periodontitis with bone loss, tooth mobility, gingival recession, multiple missing teeth. Options were presented to the patient by dentistry at the time however patient refused the options and stated he will contact dental medicine when he was ready to proceed with any kind of dental treatment. Viscous lidocaine as needed. Outpatient follow-up.  Discharge Diagnoses:  Principal Problem:   TIA (transient ischemic attack): Probable Active Problems:   Dehydration   Atrial fibrillation with RVR (HCC)   Nasopharyngeal mass   ICAO (internal carotid artery occlusion), right   Transient left leg weakness   Ascending aortic aneurysm (HCC)   Hypomagnesemia   Cardiomyopathy (HCC)   Right carotid artery occlusion   Head and neck cancer [facility])   Dysphagia   Chest congestion    Discharge Instructions  Discharge Instructions    Ambulatory referral to Neurology    Complete by:  As directed    An appointment is requested in approximately 6 WEEKS.     Allergies as of 01/08/2017      Reactions   Fish Allergy Swelling   Fresh water fish caused throat to swell (child)   Penicillins Other (See Comments)   Unknown childhood allergic reaction      Medication List    STOP taking these medications   lisinopril 2.5 MG tablet Commonly known as:  ZESTRIL     TAKE these medications   amiodarone 200 MG tablet Commonly known as:  PACERONE Take 1 tablet (200 mg total) by mouth 2 (two) times daily.   apixaban 5 MG Tabs  tablet Commonly known as:  ELIQUIS Take 1 tablet (5 mg total) by mouth 2 (two) times daily.   atorvastatin 20 MG tablet Commonly known as:  LIPITOR Take 1 tablet (20 mg total) by mouth daily.   clonazePAM 0.5 MG tablet Commonly known as:  KLONOPIN Take 0.5 mg by mouth 2 (two) times daily as needed for anxiety. What changed:  Another medication with the same name was added. Make sure you understand how and when to take each.   clonazePAM 0.5 MG tablet Commonly known as:  KLONOPIN Take 1 tablet (0.5 mg total) by mouth 2 (two) times daily as needed (Anxiety). What changed:  You were already taking a medication with the same name, and this prescription was added. Make sure you understand how and when to take each.   digoxin 0.25 MG tablet Commonly known as:  LANOXIN Take 1 tablet (0.25 mg total) by mouth daily.   HYDROcodone-acetaminophen 7.5-325 MG tablet Commonly known as:  NORCO Take 0.5-1 tablets by mouth every 6 (six) hours as needed for pain.   metoprolol succinate 25 MG 24 hr tablet Commonly known as:  TOPROL-XL Take 1 tablet (25 mg total) by mouth 2 (two) times daily. What changed:  how much to take  when to take this   spironolactone 25 MG tablet Commonly known as:  ALDACTONE Take 1 tablet (25 mg total) by mouth daily.            Durable Medical Equipment        Start     Ordered   01/07/17 [phone]  For home  use only DME 3 n 1  Once     01/07/17 0857   01/07/17 0858  For home use only DME Tub bench  Once     01/07/17 0857     Follow-up Information    SETHI,PRAMOD, MD. Schedule an appointment as soon as possible for a visit in 6 week(s).   Specialties:  Neurology, Radiology Why:  OFFICE WILL CALL WITH APPOINTMENT TIME. Contact information: [address]  [place] [address] [phone]          Allergies  Allergen Reactions  . Fish Allergy Swelling    Fresh water fish caused throat to swell (child)  . Penicillins Other (See  Comments)    Unknown childhood allergic reaction    Consultations:  Neurology  Cardiology  Procedures/Studies: Dg Chest 2 View  Result Date: 12/31/2016 CLINICAL DATA:  Patient fell 5 days ago. For age trial fibrillation, atrial flutter and hypertension EXAM: CHEST  2 VIEW COMPARISON:  None. FINDINGS: Cardiomegaly with aortic atherosclerosis. No pneumonic consolidation. Bibasilar atelectasis and/or scarring left greater than right. No overt pulmonary edema or pneumothorax. Vascular calcifications are seen bilaterally along the axillary arteries. IMPRESSION: Cardiomegaly with aortic atherosclerosis. No acute pulmonary disease. Bibasilar atelectasis and/or scarring. Electronically Signed   By: Ashley Royalty M.D.   On: 12/31/2016 20:03   Dg Wrist Complete Left  Result Date: 12/31/2016 CLINICAL DATA:  Fall with pain.  Patient fell 5 days ago. EXAM: LEFT WRIST - COMPLETE 3+ VIEW COMPARISON:  None. FINDINGS: No fracture. No subluxation or dislocation. Degenerative changes are seen in the first carpometacarpal joint. IMPRESSION: 1. No acute findings. 2. Degenerative changes first carpometacarpal joint. Electronically Signed   By: Misty Stanley M.D.   On: 12/31/2016 20:01   Ct Head Wo Contrast  Result Date: 12/31/2016 CLINICAL DATA:  Fall 3 days ago.  Headache.  Initial encounter. EXAM: CT HEAD WITHOUT CONTRAST TECHNIQUE: Contiguous axial images were obtained from the base of the skull through the vertex without intravenous contrast. COMPARISON:  None. FINDINGS: Brain: No evidence of acute infarction, hemorrhage, hydrocephalus, extra-axial collection or mass lesion/mass effect. Mild diffuse cerebral atrophy and chronic small vessel disease. Vascular: No hyperdense vessel or unexpected calcification. Skull: Normal. Negative for fracture or focal lesion. Sinuses/Orbits: Right mastoid effusion noted. Other: None. IMPRESSION: No acute intracranial abnormality. Mild cerebral atrophy and chronic small vessel  disease. Right mastoid effusion. Electronically Signed   By: Earle Gell M.D.   On: 12/31/2016 20:36   Ct Soft Tissue Neck W Contrast  Addendum Date: 12/31/2016   ADDENDUM REPORT: 12/31/2016 21:45 ADDENDUM: Postobstructive RIGHT middle ear and mastoid effusions. Bone windows now submitted. Slight widening and irregularity of the RIGHT bony eustachian tube concerning for tumoral invasion. Electronically Signed   By: Elon Alas M.D.   On: 12/31/2016 21:45   Result Date: 12/31/2016 CLINICAL DATA:  Oral mass. Hypotensive, weakness. History of LEFT retroauricular cancer, prostate cancer. EXAM: CT NECK WITH CONTRAST TECHNIQUE: Multidetector CT imaging of the neck was performed using the standard protocol following the bolus administration of intravenous contrast. CONTRAST:  75 cc  ISOVUE-300 IOPAMIDOL (ISOVUE-300) INJECTION 61% COMPARISON:  None. FINDINGS: Pharynx and larynx: Approximately 16 x 22 x 19 mm mass RIGHT nasopharyngeal/ upper palatine tonsillar mass effacing the RIGHT fossa of Rosenmller, possible invasion of the RIGHT cavernous sinus, incompletely assessed. Hypopharyngeal edema compatible with post radiation change narrowing the LEFT vallecula. Patent piriform sinuses. Asymmetric fullness LEFT palatine tonsil with LEFT parapharyngeal fat stranding. Amorphous LEFT pterygoid muscles.  Salivary glands: Fat stranding bilateral submandibular space. Thyroid: Normal. Lymph nodes: Matted LEFT lateral pharyngeal lymph nodes. VASCULAR: Dense presumed hematoma RIGHT internal carotid artery origin with occlusion of the RIGHT internal carotid artery to the included cavernous segment. Limited intracranial: Normal. Visualized orbits: Normal. Mastoids and visualized paranasal sinuses: Bilateral maxillary sinus mucosal retention cyst. RIGHT middle ear in mastoid effusion. Small LEFT mastoid effusion. Skeleton: Poor dentition with multiple dental caries, periapical lucencies with bony re- absorption of the LEFT  greater than RIGHT alveolar ridge and LEFT angle of the mandible. Chronic deformity RIGHT club proximal clavicle. Multilevel severe degenerative changes cervical spine. Upper chest: Ascending aortic aneurysm at 4.2 cm with moderate calcific atherosclerosis. Other: Thickened platysma. IMPRESSION: Limited assessment due to lack of prior imaging. 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 Possible skullbase invasion would be better characterized on MRI neck with contrast. 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 **An incidental finding of potential clinical significance has been found. 4.2 cm ascending aortic aneurysm. Recommend annual imaging followup by CTA or MRA. This recommendation follows 2010 ACCF/AHA/AATS/ACR/ASA/SCA/SCAI/SIR/STS/SVM Guidelines for the Diagnosis and Management of Patients with Thoracic Aortic Disease. Circulation. 2010; [id number]** Acute findings discussed with and reconfirmed by PA CHRISTOPHER LAWYER on 12/31/2016 at 9:07 pm. Electronically Signed: By: Elon Alas M.D. On: 12/31/2016 21:09   Mr Jodene Nam Neck W Wo Contrast  Result Date: 01/02/2017 CLINICAL DATA:  Right carotid artery occlusion. History of head neck cancer diagnosed in 2012 with treatment in [place] including chemotherapy and radiation therapy. EXAM: MRA NECK WITHOUT AND WITH CONTRAST TECHNIQUE: Multiplanar and multiecho pulse sequences of the neck were obtained without and with intravenous contrast. Angiographic images of the neck were obtained using MRA technique without and with intravenous contrast. CONTRAST:  34mL MULTIHANCE GADOBENATE DIMEGLUMINE 529  MG/ML IV SOLN COMPARISON:  None. FINDINGS: Three vessel arch branching. Abrupt occlusion of the right internal carotid artery at its origin. The lumen is visible and distended along most of its length, compatible with an acute occlusion. Enhanced seen, necrotic appearing right nasopharyngeal mass which involves the skullbase at the level of the carotid canal. This is the favored causes of occlusion. The patient has changes of head neck radiotherapy for cancer, and there could be contributory ICA stenosis in this setting. Faint flow in the right cavernous ICA which progressively normalizes. The right MCA and ACA vessels are likely under filled based on vessel size when compared to the left. Tiny anterior and bilateral posterior communicating artery is noted. The left carotid circulation shows ICA tortuosity without stenosis. Left dominant vertebrobasilar system. There is narrowing at both vertebral artery ostia, with flow gap on the left. No proximal subclavian stenosis. IMPRESSION: 1. Right ICA occlusion beginning at the bulb. The nonenhancing lumen is not collapsed, suggesting recent occlusion. 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 Patient has changes of head and neck radiotherapy, and obscured ICA stenosis may contribute. Right cavernous ICA reconstitution; limited circle-of-Willis collateral flow due to small communicating arteries. 2. Bilateral advanced vertebral ostial stenoses. Electronically Signed   By: Monte Fantasia M.D.   On: 01/02/2017 11:35   Mr Jeri Cos [id number] Contrast  Result Date: 01/01/2017 CLINICAL DATA:  Initial evaluation  for transient left leg weakness, tremor. EXAM: MRI HEAD WITHOUT AND WITH CONTRAST TECHNIQUE: Multiplanar, multiecho pulse sequences of the brain and surrounding structures were obtained without and with intravenous contrast. CONTRAST:  15 cc of MultiHance. COMPARISON:  Prior CT from 12/31/2016. FINDINGS: Brain:  Diffuse prominence of the CSF containing spaces is compatible with generalized cerebral atrophy. Patchy and confluent T2/FLAIR hyperintensity within the periventricular and deep white matter both cerebral hemispheres most compatible chronic small vessel ischemic disease, mild in nature. Mild chronic microvascular ischemic changes noted within the pons as well. No abnormal foci of restricted diffusion to suggest acute or subacute ischemia. Gray-white matter differentiation maintained. No evidence for chronic infarction. No evidence for acute or chronic intracranial hemorrhage. No mass lesion, midline shift, or mass effect. No hydrocephalus. No extra-axial fluid collection. Minimal serpiginous enhancement within the left occipital lobe favored to reflect a small DVA (series 23, image 14). No other abnormal enhancement. Pituitary gland and suprasellar region within normal limits. Vascular: Abnormal flow void within the right ICA and, compatible with previous identified right ICA occlusion. Major intracranial vascular flow voids otherwise maintained. Skull and upper cervical spine: Asymmetric fullness with enhancement at the right nasopharynx, compatible with previous identified nasopharyngeal mass. This measures approximately 15 x 27 mm on this exam (series 23, image 4). Finding concerning for possible recurrent or metastatic disease. There is asymmetric dural thickening with enhancement along the floor of the adjacent anteromedial left middle cranial fossa (series 24, image 21). Craniocervical junction within normal limits. Visualized upper cervical spine unremarkable. Bone marrow signal intensity normal. No scalp soft tissue abnormality. Sinuses/Orbits: Globes and orbital soft tissues within normal limits. Scattered mucosal thickening within the maxillary sinuses and ethmoidal air cells. No air-fluid level to suggest active sinus infection. Right-sided mastoid effusion, likely postobstructive. Small left mastoid  effusion noted as well. MRI NECK FINDINGS: Study moderately degraded by motion artifact. Previously identified right nasopharyngeal mass again seen. Overall, this lesion measures approximately 22 x 34 x 28 mm on this exam (AP by transverse by craniocaudad). Mass appears to extend into the petrous right temporal bone, and is intimately associated with the petrous right ICA (series 20, image 4). Subtle asymmetric dural thickening seen just superiorly at the floor of the left middle cranial fossa may reflect early skullbase invasion or possibly reactive changes (Series 22, image 16). Asymmetric enhancement within the right eustachian tube again concerning for possible tumoral invasion. Postobstructive right mastoid effusion noted. 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 Associated intimal enhancement, suggesting that this is acute in nature. Previously questioned left palatine tonsillar carcinoma is not clearly seen on this exam. The left tonsil itself is mildly prominent as compared to the right with relative partial effacement of the left parapharyngeal fat. However, no associated enhancement. 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 Mild asymmetric prominence with fullness within the adjacent right oropharyngeal mucosa. Inferiorly, remainder of the hypopharynx and supraglottic larynx unremarkable. True cords grossly normal. Subglottic airway clear. 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 Changes are worse within the right mandibular body as compared to the left. Suspected post radiation changes within the bilateral submandibular spaces, slightly greater on the left. No appreciable adenopathy identified within the neck. Thyroid gland is normal. Salivary glands including the parotid  and submandibular glands within  normal limits. Visualized upper mediastinum grossly unremarkable. Partially visualized lungs are grossly clear. Visualized osseous structures demonstrate no acute abnormality. Moderate multilevel degenerate spondylolysis noted within the visualized cervical spine, greatest at C6-7. IMPRESSION: MRI HEAD IMPRESSION: 1. No acute intracranial process identified. 2. Mild chronic microvascular ischemic disease. MRI NECK IMPRESSION: 1. 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 Mass involves the petrous aspect of the right temporal bone superiorly, and is intimately associated with the petrous right ICA. Subtle asymmetric dural thickening and enhancement within the adjacent left middle cranial fossa may reflect early skullbase/intracranial extension and/or reactive changes. 2. 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 3. Asymmetric prominence of the left palatine tonsil without associated enhancement, with asymmetric enhancement within the right palatine tonsil (see above discussion). Correlation with direct visualization recommended. 4. 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 Again, possible infection/osteomyelitis not excluded. Electronically Signed   By: Jeannine Boga M.D.   On: 01/01/2017 05:36   Mr Neck Soft Tissue Only W Wo Contrast  Result Date: 01/01/2017 CLINICAL DATA:  Initial evaluation for transient left leg weakness, tremor. EXAM: MRI HEAD WITHOUT AND WITH CONTRAST TECHNIQUE: Multiplanar, multiecho pulse sequences of the brain and surrounding structures were obtained without and with intravenous contrast. CONTRAST:  15 cc of MultiHance. COMPARISON:  Prior CT from 12/31/2016. FINDINGS: Brain:  Diffuse prominence of the CSF containing spaces is compatible with generalized cerebral atrophy. Patchy and confluent T2/FLAIR hyperintensity within the periventricular and deep white matter both cerebral hemispheres most compatible chronic small vessel ischemic disease, mild in nature. Mild chronic microvascular ischemic changes noted within the pons as well. No abnormal foci of restricted diffusion to suggest acute or subacute ischemia. Gray-white matter differentiation maintained. No evidence for chronic infarction. No evidence for acute or chronic intracranial hemorrhage. No mass lesion, midline shift, or mass effect. No hydrocephalus. No extra-axial fluid collection. Minimal serpiginous enhancement within the left occipital lobe favored to reflect a small DVA (series 23, image 14). No other abnormal enhancement. Pituitary gland and suprasellar region within normal limits. Vascular: Abnormal flow void within the right ICA and, compatible with previous identified right ICA occlusion. Major intracranial vascular flow voids otherwise maintained. Skull and upper cervical spine: Asymmetric fullness with enhancement at the right nasopharynx, compatible with previous identified nasopharyngeal mass. This measures approximately 15 x 27 mm on this exam (series 23, image 4). Finding concerning for possible recurrent or metastatic disease. There is asymmetric dural thickening with enhancement along the floor of the adjacent anteromedial left middle cranial fossa (series 24, image 21). Craniocervical junction within normal limits. Visualized upper cervical spine unremarkable. Bone marrow signal intensity normal. No scalp soft tissue abnormality. Sinuses/Orbits: Globes and orbital soft tissues within normal limits. Scattered mucosal thickening within the maxillary sinuses and ethmoidal air cells. No air-fluid level to suggest active sinus infection. Right-sided mastoid effusion, likely postobstructive. Small left mastoid  effusion noted as well. MRI NECK FINDINGS: Study moderately degraded by motion artifact. Previously identified right nasopharyngeal mass again seen. Overall, this lesion measures approximately 22 x 34 x 28 mm on this exam (AP by transverse by craniocaudad). Mass appears to extend into the petrous right temporal bone, and is intimately associated with the petrous right ICA (series 20, image 4). Subtle asymmetric dural thickening seen  just superiorly at the floor of the left middle cranial fossa may reflect early skullbase invasion or possibly reactive changes (Series 22, image 16). Asymmetric enhancement within the right eustachian tube again concerning for possible tumoral invasion. Postobstructive right mastoid effusion noted. 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 Associated intimal enhancement, suggesting that this is acute in nature. Previously questioned left palatine tonsillar carcinoma is not clearly seen on this exam. The left tonsil itself is mildly prominent as compared to the right with relative partial effacement of the left parapharyngeal fat. However, no associated enhancement. 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 Mild asymmetric prominence with fullness within the adjacent right oropharyngeal mucosa. Inferiorly, remainder of the hypopharynx and supraglottic larynx unremarkable. True cords grossly normal. Subglottic airway clear. 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 Changes are worse within the right mandibular body as compared to the left. Suspected post radiation changes within the bilateral submandibular spaces, slightly greater on the left. No appreciable adenopathy identified within the neck. Thyroid gland is normal. Salivary glands including the parotid  and submandibular glands within normal limits. Visualized upper mediastinum grossly unremarkable. Partially visualized lungs are grossly clear. Visualized osseous structures demonstrate no acute abnormality. Moderate multilevel degenerate spondylolysis noted within the visualized cervical spine, greatest at C6-7. IMPRESSION: MRI HEAD IMPRESSION: 1. No acute intracranial process identified. 2. Mild chronic microvascular ischemic disease. MRI NECK IMPRESSION: 1. 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 Mass involves the petrous aspect of the right temporal bone superiorly, and is intimately associated with the petrous right ICA. Subtle asymmetric dural thickening and enhancement within the adjacent left middle cranial fossa may reflect early skullbase/intracranial extension and/or reactive changes. 2. 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 3. Asymmetric prominence of the left palatine tonsil without associated enhancement, with asymmetric enhancement within the right palatine tonsil (see above discussion). Correlation with direct visualization recommended. 4. 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 Again, possible infection/osteomyelitis not excluded. Electronically Signed   By: Jeannine Boga M.D.   On: 01/01/2017 05:36   Dg Chest Port 1 View  Result Date: 01/03/2017 CLINICAL DATA:  Chest congestion.  Shortness of breath. EXAM: PORTABLE CHEST 1 VIEW COMPARISON:  12/31/2016 FINDINGS: The cardiac silhouette is stably enlarged. Mediastinal contours appear intact. There is no evidence of pneumothorax. There are mildly increased interstitial markings. Patchy airspace consolidation is seen in the right more than left lower lobes.  There is probable left lower lobe atelectasis. Small subpulmonic effusions cannot be excluded. Osseous structures are without acute abnormality. Soft tissues are grossly normal. IMPRESSION: Interval development of mild increase of the interstitial markings and patchy airspace consolidation in right more than left lower lobe. This may represent multifocal pneumonia or asymmetric interstitial pulmonary edema. Electronically Signed   By: Fidela Salisbury M.D.   On: 01/03/2017 13:17   Dg Swallowing Func-speech Pathology  Result Date: 01/06/2017 Objective Swallowing Evaluation: Type of Study: MBS-Modified Barium Swallow Study Patient Details Name: Yisrael Englander MRN: [id number] Date of Birth: 1946/05/14 Today's Date: 01/06/2017 Time: SLP Start Time (ACUTE ONLY): 1417-SLP  Stop Time (ACUTE ONLY): 1430 SLP Time Calculation (min) (ACUTE ONLY): 13 min Past Medical History: Past Medical History: Diagnosis Date . A-fib ([place])  . Atrial flutter ([place])  . 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 Hypertension  . Prostate cancer ([place]) 2002 . Status post radiation 09/2011 Past Surgical History: Past Surgical History: Procedure Laterality Date . PROSTATECTOMY  2002 HPI: 71 y.o. gentleman with a history of head and neck cancer (diagnosed in 2012, treated in [place] with chemotherapy and radiation, details in dental clinic visit progress note from 11/25/16 by Dr. Enrique Sack), prostate cancer, HTN, and atrial fibrillation/flutter (CHADS-Vasc score of 3, he is not anticoagulated) who presents to the ED on 1/24/18for evaluation after having two falls at home on Saturday. The patient reports that he has had two distinct episodes of acute onset, involuntary, uncontrollable left leg tremor that ultimately causes him to fall. Both times, he has had increased difficulty getting back on his feet, reporting that it takes him 30 minutes to "recover". He denies LOC or associated confusion/post-ictal state. Likely secondary to probable TIA in the setting  of high-grade carotid lesion/carotid stenosis. 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 States that he has dysphagia over the past 2 months and severe pain in him mouth. CXR 1/26: Interval development of mild increase of the interstitial markings and patchy airspace consolidation in right more than left lower lobe. This may represent multifocal pneumonia or asymmetric interstitial pulmonary edema. Note that patient seen by Dentist on 12/18 who noted Oral mass involving lower right quadrant. Patient given options including possible biopsy but has not made a decision to proceed.  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 He has weak lingual propulsion and incomplete velopharyngeal closure that allows for nasal regurgitation of all consistencies. This mostly clears after the swallow as nasopharyngeal residue spills downward into the pharynx, but larger straw sips do travel deeper through the nasal cavity. He has a delayed swallow trigger that results in flash penetration of thin liquids via straw as well. Pt has decreased ability to establish intraoral pressure to assist in bolus propulsion through the pharynx, resulting in moderate, diffuse residue post-swallow. This can be reduced with a cued second swallow, but attempt at a chin tuck results in increased nasal regurgitation. Recommend to continue with Dys 1 diet and thin liquids by cup,  although with strict control of bolus size and use of second swallow per bolus. Will continue to follow.  Impact on safety and function Mild aspiration risk;Moderate aspiration risk   CHL IP TREATMENT RECOMMENDATION 01/06/2017 Treatment Recommendations Therapy as outlined in treatment plan below   Prognosis 01/06/2017 Prognosis for Safe Diet Advancement Fair Barriers to Reach Goals Other (Comment) Barriers/Prognosis Comment -- CHL IP DIET RECOMMENDATION 01/06/2017 SLP Diet Recommendations Dysphagia 1 (Puree) solids;Thin liquid Liquid Administration via Cup;No straw Medication Administration Crushed with puree Compensations Slow rate;Small sips/bites;Multiple dry swallows after each bite/sip Postural Changes Remain semi-upright after after feeds/meals (Comment);Seated upright at 90 degrees   CHL IP OTHER RECOMMENDATIONS 01/06/2017 Recommended Consults Consider ENT evaluation  Oral Care Recommendations Oral care BID Other Recommendations Have oral suction available   CHL IP FOLLOW UP RECOMMENDATIONS 01/06/2017 Follow up Recommendations Skilled Nursing facility   [facility] IP FREQUENCY AND DURATION 01/06/2017 Speech Therapy Frequency (ACUTE ONLY) min 2x/week Treatment Duration 2 weeks      CHL IP ORAL PHASE 01/06/2017 Oral Phase Impaired Oral - Pudding Teaspoon -- Oral - Pudding Cup -- Oral - Honey Teaspoon -- Oral - Honey Cup -- Oral - Nectar Teaspoon -- Oral - Nectar Cup -- Oral - Nectar Straw -- Oral - Thin Teaspoon -- Oral - Thin Cup Decreased velopharyngeal closure;Nasal reflux;Weak lingual manipulation Oral - Thin Straw Decreased velopharyngeal closure;Nasal reflux;Weak lingual manipulation Oral - Puree Decreased velopharyngeal closure;Nasal reflux;Weak lingual manipulation Oral - Mech Soft -- Oral - Regular -- Oral - Multi-Consistency -- Oral - Pill -- Oral Phase - Comment --  CHL IP PHARYNGEAL PHASE 01/06/2017 Pharyngeal Phase Impaired Pharyngeal- Pudding Teaspoon -- Pharyngeal -- Pharyngeal- Pudding Cup -- Pharyngeal --  Pharyngeal- Honey Teaspoon -- Pharyngeal -- Pharyngeal- Honey Cup -- Pharyngeal -- Pharyngeal- Nectar Teaspoon -- Pharyngeal -- Pharyngeal- Nectar Cup -- Pharyngeal -- Pharyngeal- Nectar Straw -- Pharyngeal -- Pharyngeal- Thin Teaspoon -- Pharyngeal -- Pharyngeal- Thin Cup Delayed swallow initiation-pyriform sinuses;Pharyngeal residue - valleculae;Pharyngeal residue - pyriform Pharyngeal -- Pharyngeal- Thin Straw Delayed swallow initiation-pyriform sinuses;Pharyngeal residue - valleculae;Pharyngeal residue - pyriform;Penetration/Aspiration before swallow Pharyngeal Material enters airway, remains ABOVE vocal cords then ejected out Pharyngeal- Puree Delayed swallow initiation-pyriform sinuses;Pharyngeal residue - valleculae;Pharyngeal residue - pyriform Pharyngeal -- Pharyngeal- Mechanical Soft -- Pharyngeal -- Pharyngeal- Regular -- Pharyngeal -- Pharyngeal- Multi-consistency -- Pharyngeal -- Pharyngeal- Pill -- Pharyngeal -- Pharyngeal Comment --  CHL IP CERVICAL ESOPHAGEAL PHASE 01/06/2017 Cervical Esophageal Phase WFL Pudding Teaspoon -- Pudding Cup -- Honey Teaspoon -- Honey Cup -- Nectar Teaspoon -- Nectar Cup -- Nectar Straw -- Thin Teaspoon -- Thin Cup -- Thin Straw -- Puree -- Mechanical Soft -- Regular -- Multi-consistency -- Pill -- Cervical Esophageal Comment -- CHL IP GO 01/02/2017 Functional Assessment Tool Used (No Data) Functional Limitations Other Speech Language Pathology Swallow Current Status [phone]) (None) Swallow Goal Status [id number]) (None) Swallow Discharge Status [id number]) (None) Motor Speech Current Status [id number]) (None) Motor Speech Goal Status UK:[id number]) (None) Motor Speech Goal Status [id number]) (None) Spoken Language Comprehension Current Status [id number]) (None) Spoken Language Comprehension Goal Status [id number]) (None) Spoken Language Comprehension Discharge Status [id number]) (None) Spoken Language Expression Current Status [phone]) (None) Spoken Language Expression Goal Status [id number]) (None) Spoken  Language Expression Discharge Status [phone]) (None) Attention Current Status [id number]) (None) Attention Goal Status [id number]:[id number]) (None) Attention Discharge Status [id number]) (None) Memory Current Status [id number]) (None) Memory Goal Status [id number]) (None) Memory Discharge Status QC:[id number]) (None) Voice Current Status [id number]) (None) Voice Goal Status [id number]) (None) Voice Discharge Status [id number]) (None) Other Speech-Language Pathology Functional Limitation [phone]) [facility] Other Speech-Language Pathology Functional Limitation Goal Status [id number]) (None) Other Speech-Language Pathology Functional Limitation Discharge Status [phone]) (None) Germain Osgood 01/06/2017, 3:12 PM  Germain Osgood, M.A. CCC-SLP [phone]               Subjective: No complaints  Discharge Exam: Vitals:   01/08/17 0100 01/08/17 0510  BP: 121/89 100/75  Pulse: 72 94  Resp: 18 18  Temp: 98.6 F (37 C) 98.6 F (37 C)   Vitals:   01/07/17 2043 01/07/17 2100 01/08/17 0100 01/08/17 0510  BP:  104/87 121/89 100/75  Pulse:  60 72 94  Resp:  18 18 18   Temp:  98.4 F (  36.9 C) 98.6 F (37 C) 98.6 F (37 C)  TempSrc:  Oral Oral Oral  SpO2: 94% 91% 93% 93%  Weight:    85.6 kg (188 lb 11.2 oz)  Height:        General: Pt is alert, awake, not in acute distress Cardiovascular: RRR, S1/S2 +, no rubs, no gallops Respiratory: CTA bilaterally, no wheezing, no rhonchi Abdominal: Soft, NT, ND, bowel sounds + Extremities: no edema, no cyanosis   The results of significant diagnostics from this hospitalization (including imaging, microbiology, ancillary and laboratory) are listed below for reference.     Microbiology: Recent Results (from the past 240 hour(s))  MRSA PCR Screening     Status: None   Collection Time: 01/01/17  5:10 PM  Result Value Ref Range Status   MRSA by PCR NEGATIVE NEGATIVE Final    Comment:        The GeneXpert MRSA Assay (FDA approved for NASAL specimens only), is one component of a comprehensive MRSA  colonization surveillance program. It is not intended to diagnose MRSA infection nor to guide or monitor treatment for MRSA infections.      Labs: BNP (last 3 results) No results for input(s): BNP in the last 8760 hours. Basic Metabolic Panel:  Recent Labs Lab 01/04/17 0520 01/05/17 0207 01/06/17 0137 01/07/17 0222 01/08/17 0718  NA 133* 133* 133* 134* 128*  K 3.5 3.5 4.0 3.9 4.2  CL 99* 97* 98* 98* 95*  CO2 27 25 26 28 25   GLUCOSE 98 104* 105* 97 97  BUN 6 10 11 13 9   CREATININE 0.64 0.66 0.69 0.64 0.66  CALCIUM 8.5* 8.5* 8.8* 8.7* 8.7*  MG 1.5* 1.6* 1.7 1.7 1.6*   Liver Function Tests: No results for input(s): AST, ALT, ALKPHOS, BILITOT, PROT, ALBUMIN in the last 168 hours. No results for input(s): LIPASE, AMYLASE in the last 168 hours. No results for input(s): AMMONIA in the last 168 hours. CBC:  Recent Labs Lab 01/03/17 0308 01/04/17 0520 01/05/17 0207 01/06/17 0137 01/08/17 0718  WBC 6.3 6.7 6.7 5.8 7.2  HGB 14.0 14.3 14.8 14.9 15.8  HCT 42.1 41.8 43.9 44.5 46.5  MCV 99.5 99.1 99.1 99.1 98.7  PLT 149* 152 175 192 289   Cardiac Enzymes:  Recent Labs Lab 01/01/17 1516 01/01/17 2119  TROPONINI <0.03 <0.03   BNP: Invalid input(s): POCBNP CBG: No results for input(s): GLUCAP in the last 168 hours. D-Dimer No results for input(s): DDIMER in the last 72 hours. Hgb A1c No results for input(s): HGBA1C in the last 72 hours. Lipid Profile No results for input(s): CHOL, HDL, LDLCALC, TRIG, CHOLHDL, LDLDIRECT in the last 72 hours. Thyroid function studies No results for input(s): TSH, T4TOTAL, T3FREE, THYROIDAB in the last 72 hours.  Invalid input(s): FREET3 Anemia work up No results for input(s): VITAMINB12, FOLATE, FERRITIN, TIBC, IRON, RETICCTPCT in the last 72 hours. Urinalysis    Component Value Date/Time   COLORURINE YELLOW 01/01/2017 0054   APPEARANCEUR CLEAR 01/01/2017 0054   LABSPEC 1.045 (H) 01/01/2017 0054   PHURINE 5.0 01/01/2017  [place] 01/01/2017 0054   HGBUR NEGATIVE 01/01/2017 0054   BILIRUBINUR NEGATIVE 01/01/2017 0054   KETONESUR 20 (A) 01/01/2017 0054   PROTEINUR NEGATIVE 01/01/2017 0054   NITRITE NEGATIVE 01/01/2017 0054   LEUKOCYTESUR NEGATIVE 01/01/2017 0054   Sepsis Labs Invalid input(s): PROCALCITONIN,  WBC,  LACTICIDVEN Microbiology Recent Results (from the past 240 hour(s))  MRSA PCR Screening     Status: None  Collection Time: 01/01/17  5:10 PM  Result Value Ref Range Status   MRSA by PCR NEGATIVE NEGATIVE Final    Comment:        The GeneXpert MRSA Assay (FDA approved for NASAL specimens only), is one component of a comprehensive MRSA colonization surveillance program. It is not intended to diagnose MRSA infection nor to guide or monitor treatment for MRSA infections.      SIGNED:   Donne Hazel, MD  [facility] 01/08/2017, 10:12 AM  If 7PM-7AM, please contact night-coverage [email or web address] Password [email or web address]

## 2017-01-08 NOTE — Clinical Social Work Placement (Addendum)
   CLINICAL SOCIAL WORK PLACEMENT  NOTE 01/08/17 - DISCHARGED TO [facility]IA AMBULANCE  Date:  01/08/2017  Patient Details  Name: Tyler Holland MRN: [id number] Date of Birth: [birth year]  Clinical Social Work is seeking post-discharge placement for this patient at the [place] level of care (*CSW will initial, date and re-position this form in  chart as items are completed):  Yes   Patient/family provided with [facility] Department's list of facilities offering this level of care within the geographic area requested by the patient (or if unable, by the patient's family).  Yes   Patient/family informed of their freedom to choose among providers that offer the needed level of care, that participate in Medicare, Medicaid or managed care program needed by the patient, have an available bed and are willing to accept the patient.  Yes   Patient/family informed of [place] [facility]'s ownership interest in [facility] and [facility], as well as of the fact that they are under no obligation to receive care at these facilities.  PASRR submitted to EDS on 01/03/17     PASRR number received on 01/03/17     Existing PASRR number confirmed on       FL2 transmitted to all facilities in geographic area requested by pt/family on 01/03/17     FL2 transmitted to all facilities within larger geographic area on       Patient informed that his/her managed care company has contracts with or will negotiate with certain facilities, including the following:         Yes - Patient/family informed of bed offers received.  Patient chooses bed at  [facility]     Physician recommends and patient chooses bed at      Patient to be transferred to  [facility] on  01/08/17.  Patient to be transferred to facility by  family.    Patient family notified on  01/08/17 of transfer.  Name of family member notified:   Marcie Bal [phone].     PHYSICIAN Please sign  FL2, Please sign DNR     Additional Comment:    _______________________________________________ Sable Feil, LCSW 01/08/2017, 3:19 PM

## 2017-01-09 ENCOUNTER — Non-Acute Institutional Stay (SKILLED_NURSING_FACILITY): Payer: Medicare Other | Admitting: Adult Health

## 2017-01-09 ENCOUNTER — Encounter: Payer: Self-pay | Admitting: Adult Health

## 2017-01-09 DIAGNOSIS — I7121 Aneurysm of the ascending aorta, without rupture: Secondary | ICD-10-CM

## 2017-01-09 DIAGNOSIS — R1310 Dysphagia, unspecified: Secondary | ICD-10-CM

## 2017-01-09 DIAGNOSIS — F419 Anxiety disorder, unspecified: Secondary | ICD-10-CM

## 2017-01-09 DIAGNOSIS — I6521 Occlusion and stenosis of right carotid artery: Secondary | ICD-10-CM

## 2017-01-09 DIAGNOSIS — I959 Hypotension, unspecified: Secondary | ICD-10-CM

## 2017-01-09 DIAGNOSIS — I712 Thoracic aortic aneurysm, without rupture: Secondary | ICD-10-CM

## 2017-01-09 DIAGNOSIS — J392 Other diseases of pharynx: Secondary | ICD-10-CM | POA: Diagnosis not present

## 2017-01-09 DIAGNOSIS — Z720 Tobacco use: Secondary | ICD-10-CM

## 2017-01-09 DIAGNOSIS — R531 Weakness: Secondary | ICD-10-CM

## 2017-01-09 DIAGNOSIS — I4891 Unspecified atrial fibrillation: Secondary | ICD-10-CM

## 2017-01-09 DIAGNOSIS — K1379 Other lesions of oral mucosa: Secondary | ICD-10-CM

## 2017-01-09 DIAGNOSIS — G459 Transient cerebral ischemic attack, unspecified: Secondary | ICD-10-CM

## 2017-01-09 DIAGNOSIS — H6591 Unspecified nonsuppurative otitis media, right ear: Secondary | ICD-10-CM

## 2017-01-09 DIAGNOSIS — I429 Cardiomyopathy, unspecified: Secondary | ICD-10-CM

## 2017-01-09 DIAGNOSIS — E871 Hypo-osmolality and hyponatremia: Secondary | ICD-10-CM

## 2017-01-09 NOTE — Progress Notes (Addendum)
DATE:  01/09/2017   MRN:  [id number]  BIRTHDAY: Jan 30, 1946  Facility:  Nursing Home Location:  [place] [facility] [place] Room Number: [id number]  LEVEL OF CARE:  SNF (31)  Contact Information    Name Relation Home Work Mobile   [name],[name] Relative [phone]         Code Status History    Date Active Date Inactive Code Status Order ID Comments User Context   01/02/2017  7:55 AM 01/08/2017  8:26 PM DNR [id number]  Tyler Filler, MD Inpatient   01/01/2017  3:44 AM 01/01/2017  3:44 AM DNR [id number]  Tyler Kocher, MD ED   01/01/2017  3:44 AM 01/02/2017  7:55 AM DNR [id number]  Tyler Kocher, MD ED    Questions for Most Recent Historical Code Status (Order [id number])    Question Answer Comment   In the event of cardiac or respiratory ARREST Do not call a "code blue"    In the event of cardiac or respiratory ARREST Do not perform Intubation, CPR, defibrillation or ACLS    In the event of cardiac or respiratory ARREST Use medication by any route, position, wound care, and other measures to relive pain and suffering. May use oxygen, suction and manual treatment of airway obstruction as needed for comfort.        Chief Complaint  Patient presents with  . Hospitalization Follow-up    HISTORY OF PRESENT ILLNESS:  This is a 71-YO male seen for hospital follow-up. He was admitted to [facility] on 01/08/17 following an admission at [facility] 12/31/2016-01/08/2017 fall due to an acute onset of uncontrollable LLE tremor and significant weakness.  In the ED he was noted to be in A. Fib with RVR and borderline hypotension. Cardiology was consulted. He was initially on IV Cardizem and subsequently transitioned to IV amiodarone. 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  2-D echo done with EF of 25-30%. There was a concern for probable TIA and stroke work up was done. He is on Eliquis for stroke prevention.  He was seen in the room today and did not  verbalize any concern.    PAST MEDICAL HISTORY:  Past Medical History:  Diagnosis Date  . A-fib ([place])   . Ascending aortic aneurysm ([place])   . Atrial flutter ([place])   . 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 Cardiomyopathy ([place])   . Dehydration 12/2016  . Dysphagia   . 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 Hypertension   . Hypomagnesemia   . ICAO (internal carotid artery occlusion), right   . Nasopharyngeal mass   . Prostate cancer ([place]) 2002  . Status post radiation 09/2011  . TIA (transient ischemic attack) 12/2016  . Transient left leg weakness      CURRENT MEDICATIONS: Reviewed  Patient's Medications  New Prescriptions   No medications on file  Previous Medications   AMIODARONE (PACERONE) 200 MG TABLET    Take 1 tablet (200 mg total) by mouth 2 (two) times daily.   APIXABAN (ELIQUIS) 5 MG TABS TABLET    Take 1 tablet (5 mg total) by mouth 2 (two) times daily.   ATORVASTATIN (LIPITOR) 20 MG TABLET    Take 1 tablet (20 mg total) by mouth daily.   CLONAZEPAM (KLONOPIN) 0.5 MG TABLET    Take 0.5 mg by mouth 2 (two) times daily as needed for anxiety.   DIGOXIN (LANOXIN) 0.25 MG TABLET  Take 1 tablet (0.25 mg total) by mouth daily.   HYDROCODONE-ACETAMINOPHEN (NORCO) 7.5-325 MG TABLET    Take 0.5-1 tablets by mouth every 6 (six) hours as needed for moderate pain.   METOPROLOL SUCCINATE (TOPROL-XL) 25 MG 24 HR TABLET    Take 1 tablet (25 mg total) by mouth 2 (two) times daily.   SPIRONOLACTONE (ALDACTONE) 25 MG TABLET    Take 1 tablet (25 mg total) by mouth daily.  Modified Medications   No medications on file  Discontinued Medications   CLONAZEPAM (KLONOPIN) 0.5 MG TABLET    Take 1 tablet (0.5 mg total) by mouth 2 (two) times daily as needed (Anxiety).   HYDROCODONE-ACETAMINOPHEN (NORCO) 7.5-325 MG TABLET    Take 0.5-1 tablets by mouth every 6 (six) hours as needed for pain.     Allergies  Allergen Reactions  . Fish Allergy Swelling    Fresh water fish caused throat to swell  (child)  . Penicillins Other (See Comments)    Unknown childhood allergic reaction     REVIEW OF SYSTEMS:  GENERAL: no change in appetite, no fatigue, no weight changes, no fever, chills or weakness EYES: Denies change in vision, dry eyes, eye pain, itching or discharge EARS: Denies change in hearing, ringing in ears, or earache NOSE: Denies nasal congestion or epistaxis MOUTH and THROAT: + mouth pain RESPIRATORY: no cough, SOB, DOE, wheezing, hemoptysis CARDIAC: no chest pain, edema or palpitations GI: no abdominal pain, diarrhea, constipation, heart burn, nausea or vomiting GU: Denies dysuria, frequency, hematuria, incontinence, or discharge PSYCHIATRIC: Denies feeling of depression or anxiety. No report of hallucinations, insomnia, paranoia, or agitation   PHYSICAL EXAMINATION  GENERAL APPEARANCE: Well nourished. In no acute distress. Normal body habitus SKIN:  Skin is warm and dry.  HEAD: Normal in size and contour. No evidence of trauma EYES: Lids open and close normally. No blepharitis, entropion or ectropion. PERRL. Conjunctivae are clear and sclerae are white. Lenses are without opacity EARS: Pinnae are normal. Patient hears normal voice tunes of the examiner MOUTH and THROAT: Lips are without lesions. Oral mucosa is moist and without lesions. Tongue is normal in shape, size, and color and without lesions, +dental caries NECK: supple, trachea midline, no neck masses, no thyroid tenderness, no thyromegaly LYMPHATICS: no LAN in the neck, no supraclavicular LAN RESPIRATORY: breathing is even & unlabored, BS CTAB CARDIAC: RRR, no murmur,no extra heart sounds, no edema GI: abdomen soft, normal BS, no masses, no tenderness, no hepatomegaly, no splenomegaly EXTREMITIES:  Able to move X 4 extremities, BLE with generalized weakness (left weaker than right) PSYCHIATRIC: Alert and oriented X 3. Affect and behavior are appropriate    LABS/RADIOLOGY: Labs reviewed: Basic Metabolic  Panel:  Recent Labs  01/06/17 0137 01/07/17 0222 01/08/17 0718  NA 133* 134* 128*  K 4.0 3.9 4.2  CL 98* 98* 95*  CO2 26 28 25   GLUCOSE 105* 97 97  BUN 11 13 9   CREATININE 0.69 0.64 0.66  CALCIUM 8.8* 8.7* 8.7*  MG 1.7 1.7 1.6*   Liver Function Tests:  Recent Labs  12/31/16 1441 01/01/17 0934  AST 22 21  ALT 15* 15*  ALKPHOS 75 60  BILITOT 1.3* 1.0  PROT 6.8 5.5*  ALBUMIN 3.2* 2.7*    Recent Labs  12/31/16 1441  LIPASE 19   CBC:  Recent Labs  01/01/17 0934  01/05/17 0207 01/06/17 0137 01/08/17 0718  WBC 7.1  < > 6.7 5.8 7.2  NEUTROABS 5.8  --   --   --   --  HGB 15.4  < > 14.8 14.9 15.8  HCT 45.2  < > 43.9 44.5 46.5  MCV 99.8  < > 99.1 99.1 98.7  PLT 152  < > 175 192 289  < > = values in this interval not displayed. Lipid Panel:  Recent Labs  01/01/17 1507  HDL 37*   Cardiac Enzymes:  Recent Labs  12/31/16 1850 01/01/17 0934 01/01/17 1516 01/01/17 2119  CKTOTAL 80  --   --   --   TROPONINI 0.03* <0.03 <0.03 <0.03     Dg Chest 2 View  Result Date: 12/31/2016 CLINICAL DATA:  Patient fell 5 days ago. For age trial fibrillation, atrial flutter and hypertension EXAM: CHEST  2 VIEW COMPARISON:  None. FINDINGS: Cardiomegaly with aortic atherosclerosis. No pneumonic consolidation. Bibasilar atelectasis and/or scarring left greater than right. No overt pulmonary edema or pneumothorax. Vascular calcifications are seen bilaterally along the axillary arteries. IMPRESSION: Cardiomegaly with aortic atherosclerosis. No acute pulmonary disease. Bibasilar atelectasis and/or scarring. Electronically Signed   By: Ashley Royalty M.D.   On: 12/31/2016 20:03   Dg Wrist Complete Left  Result Date: 12/31/2016 CLINICAL DATA:  Fall with pain.  Patient fell 5 days ago. EXAM: LEFT WRIST - COMPLETE 3+ VIEW COMPARISON:  None. FINDINGS: No fracture. No subluxation or dislocation. Degenerative changes are seen in the first carpometacarpal joint. IMPRESSION: 1. No acute  findings. 2. Degenerative changes first carpometacarpal joint. Electronically Signed   By: Misty Stanley M.D.   On: 12/31/2016 20:01   Ct Head Wo Contrast  Result Date: 12/31/2016 CLINICAL DATA:  Fall 3 days ago.  Headache.  Initial encounter. EXAM: CT HEAD WITHOUT CONTRAST TECHNIQUE: Contiguous axial images were obtained from the base of the skull through the vertex without intravenous contrast. COMPARISON:  None. FINDINGS: Brain: No evidence of acute infarction, hemorrhage, hydrocephalus, extra-axial collection or mass lesion/mass effect. Mild diffuse cerebral atrophy and chronic small vessel disease. Vascular: No hyperdense vessel or unexpected calcification. Skull: Normal. Negative for fracture or focal lesion. Sinuses/Orbits: Right mastoid effusion noted. Other: None. IMPRESSION: No acute intracranial abnormality. Mild cerebral atrophy and chronic small vessel disease. Right mastoid effusion. Electronically Signed   By: Earle Gell M.D.   On: 12/31/2016 20:36   Ct Soft Tissue Neck W Contrast  Addendum Date: 12/31/2016   ADDENDUM REPORT: 12/31/2016 21:45 ADDENDUM: Postobstructive RIGHT middle ear and mastoid effusions. Bone windows now submitted. Slight widening and irregularity of the RIGHT bony eustachian tube concerning for tumoral invasion. Electronically Signed   By: Elon Alas M.D.   On: 12/31/2016 21:45   Result Date: 12/31/2016 CLINICAL DATA:  Oral mass. Hypotensive, weakness. History of LEFT retroauricular cancer, prostate cancer. EXAM: CT NECK WITH CONTRAST TECHNIQUE: Multidetector CT imaging of the neck was performed using the standard protocol following the bolus administration of intravenous contrast. CONTRAST:  75 cc  ISOVUE-300 IOPAMIDOL (ISOVUE-300) INJECTION 61% COMPARISON:  None. FINDINGS: Pharynx and larynx: Approximately 16 x 22 x 19 mm mass RIGHT nasopharyngeal/ upper palatine tonsillar mass effacing the RIGHT fossa of Rosenmller, possible invasion of the RIGHT cavernous  sinus, incompletely assessed. Hypopharyngeal edema compatible with post radiation change narrowing the LEFT vallecula. Patent piriform sinuses. Asymmetric fullness LEFT palatine tonsil with LEFT parapharyngeal fat stranding. Amorphous LEFT pterygoid muscles. Salivary glands: Fat stranding bilateral submandibular space. Thyroid: Normal. Lymph nodes: Matted LEFT lateral pharyngeal lymph nodes. VASCULAR: Dense presumed hematoma RIGHT internal carotid artery origin with occlusion of the RIGHT internal carotid artery to the included cavernous segment. Limited intracranial:  Normal. Visualized orbits: Normal. Mastoids and visualized paranasal sinuses: Bilateral maxillary sinus mucosal retention cyst. RIGHT middle ear in mastoid effusion. Small LEFT mastoid effusion. Skeleton: Poor dentition with multiple dental caries, periapical lucencies with bony re- absorption of the LEFT greater than RIGHT alveolar ridge and LEFT angle of the mandible. Chronic deformity RIGHT club proximal clavicle. Multilevel severe degenerative changes cervical spine. Upper chest: Ascending aortic aneurysm at 4.2 cm with moderate calcific atherosclerosis. Other: Thickened platysma. IMPRESSION: Limited assessment due to lack of prior imaging. 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 Possible skullbase invasion would be better characterized on MRI neck with contrast. 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 **An incidental finding of potential clinical significance has been found. 4.2 cm ascending aortic aneurysm. Recommend annual imaging followup by CTA or MRA. This recommendation  follows 2010 ACCF/AHA/AATS/ACR/ASA/SCA/SCAI/SIR/STS/SVM Guidelines for the Diagnosis and Management of Patients with Thoracic Aortic Disease. Circulation. 2010; [id number]** Acute findings discussed with and reconfirmed by PA CHRISTOPHER LAWYER on 12/31/2016 at 9:07 pm. Electronically Signed: By: Elon Alas M.D. On: 12/31/2016 21:09   Mr Jodene Nam Neck W Wo Contrast  Result Date: 01/02/2017 CLINICAL DATA:  Right carotid artery occlusion. History of head neck cancer diagnosed in 2012 with treatment in [place] including chemotherapy and radiation therapy. EXAM: MRA NECK WITHOUT AND WITH CONTRAST TECHNIQUE: Multiplanar and multiecho pulse sequences of the neck were obtained without and with intravenous contrast. Angiographic images of the neck were obtained using MRA technique without and with intravenous contrast. CONTRAST:  41mL MULTIHANCE GADOBENATE DIMEGLUMINE 529 MG/ML IV SOLN COMPARISON:  None. FINDINGS: Three vessel arch branching. Abrupt occlusion of the right internal carotid artery at its origin. The lumen is visible and distended along most of its length, compatible with an acute occlusion. Enhanced seen, necrotic appearing right nasopharyngeal mass which involves the skullbase at the level of the carotid canal. This is the favored causes of occlusion. The patient has changes of head neck radiotherapy for cancer, and there could be contributory ICA stenosis in this setting. Faint flow in the right cavernous ICA which progressively normalizes. The right MCA and ACA vessels are likely under filled based on vessel size when compared to the left. Tiny anterior and bilateral posterior communicating artery is noted. The left carotid circulation shows ICA tortuosity without stenosis. Left dominant vertebrobasilar system. There is narrowing at both vertebral artery ostia, with flow gap on the left. No proximal subclavian stenosis. IMPRESSION: 1. Right ICA occlusion beginning at the bulb. The nonenhancing  lumen is not collapsed, suggesting recent occlusion. 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 Patient has changes of head and neck radiotherapy, and obscured ICA stenosis may contribute. Right cavernous ICA reconstitution; limited circle-of-Willis collateral flow due to small communicating arteries. 2. Bilateral advanced vertebral ostial stenoses. Electronically Signed   By: Monte Fantasia M.D.   On: 01/02/2017 11:35   Mr Jeri Cos [id number] Contrast  Result Date: 01/01/2017 CLINICAL DATA:  Initial evaluation for transient left leg weakness, tremor. EXAM: MRI HEAD WITHOUT AND WITH CONTRAST TECHNIQUE: Multiplanar, multiecho pulse sequences of the brain and surrounding structures were obtained without and with intravenous contrast. CONTRAST:  15 cc of MultiHance. COMPARISON:  Prior CT  from 12/31/2016. FINDINGS: Brain: Diffuse prominence of the CSF containing spaces is compatible with generalized cerebral atrophy. Patchy and confluent T2/FLAIR hyperintensity within the periventricular and deep white matter both cerebral hemispheres most compatible chronic small vessel ischemic disease, mild in nature. Mild chronic microvascular ischemic changes noted within the pons as well. No abnormal foci of restricted diffusion to suggest acute or subacute ischemia. Gray-white matter differentiation maintained. No evidence for chronic infarction. No evidence for acute or chronic intracranial hemorrhage. No mass lesion, midline shift, or mass effect. No hydrocephalus. No extra-axial fluid collection. Minimal serpiginous enhancement within the left occipital lobe favored to reflect a small DVA (series 23, image 14). No other abnormal enhancement. Pituitary gland and suprasellar region within normal limits. Vascular: Abnormal flow void within the right ICA and, compatible with previous identified right ICA occlusion. Major intracranial vascular flow voids otherwise maintained.  Skull and upper cervical spine: Asymmetric fullness with enhancement at the right nasopharynx, compatible with previous identified nasopharyngeal mass. This measures approximately 15 x 27 mm on this exam (series 23, image 4). Finding concerning for possible recurrent or metastatic disease. There is asymmetric dural thickening with enhancement along the floor of the adjacent anteromedial left middle cranial fossa (series 24, image 21). Craniocervical junction within normal limits. Visualized upper cervical spine unremarkable. Bone marrow signal intensity normal. No scalp soft tissue abnormality. Sinuses/Orbits: Globes and orbital soft tissues within normal limits. Scattered mucosal thickening within the maxillary sinuses and ethmoidal air cells. No air-fluid level to suggest active sinus infection. Right-sided mastoid effusion, likely postobstructive. Small left mastoid effusion noted as well. MRI NECK FINDINGS: Study moderately degraded by motion artifact. Previously identified right nasopharyngeal mass again seen. Overall, this lesion measures approximately 22 x 34 x 28 mm on this exam (AP by transverse by craniocaudad). Mass appears to extend into the petrous right temporal bone, and is intimately associated with the petrous right ICA (series 20, image 4). Subtle asymmetric dural thickening seen just superiorly at the floor of the left middle cranial fossa may reflect early skullbase invasion or possibly reactive changes (Series 22, image 16). Asymmetric enhancement within the right eustachian tube again concerning for possible tumoral invasion. Postobstructive right mastoid effusion noted. 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 Associated intimal enhancement, suggesting that this is acute in nature. Previously questioned left palatine tonsillar carcinoma is not clearly seen on this exam. The left tonsil itself is mildly prominent as compared to the right with  relative partial effacement of the left parapharyngeal fat. However, no associated enhancement. 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 Mild asymmetric prominence with fullness within the adjacent right oropharyngeal mucosa. Inferiorly, remainder of the hypopharynx and supraglottic larynx unremarkable. True cords grossly normal. Subglottic airway clear. 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 Changes are worse within the right mandibular body as compared to the left. Suspected post radiation changes within the bilateral submandibular spaces, slightly greater on the left. No appreciable adenopathy identified within the neck. Thyroid gland is normal. Salivary glands including the parotid and submandibular glands within normal limits. Visualized upper mediastinum grossly unremarkable. Partially visualized lungs are grossly clear. Visualized osseous structures demonstrate no acute abnormality. Moderate multilevel degenerate spondylolysis noted within the visualized cervical spine, greatest at C6-7. IMPRESSION: MRI HEAD IMPRESSION: 1. No acute intracranial process  identified. 2. Mild chronic microvascular ischemic disease. MRI NECK IMPRESSION: 1. 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 Mass involves the petrous aspect of the right temporal bone superiorly, and is intimately associated with the petrous right ICA. Subtle asymmetric dural thickening and enhancement within the adjacent left middle cranial fossa may reflect early skullbase/intracranial extension and/or reactive changes. 2. 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 3. Asymmetric prominence of the left palatine tonsil without associated enhancement, with asymmetric enhancement within the right palatine tonsil (see above discussion). Correlation with direct visualization recommended. 4. 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 Again, possible infection/osteomyelitis not excluded. Electronically Signed   By: Jeannine Boga M.D.   On: 01/01/2017 05:36   Mr Neck Soft Tissue Only W Wo Contrast  Result Date: 01/01/2017 CLINICAL DATA:  Initial evaluation for transient left leg weakness, tremor. EXAM: MRI HEAD WITHOUT AND WITH CONTRAST TECHNIQUE: Multiplanar, multiecho pulse sequences of the brain and surrounding structures were obtained without and with intravenous contrast. CONTRAST:  15 cc of MultiHance. COMPARISON:  Prior CT from 12/31/2016. FINDINGS: Brain: Diffuse prominence of the CSF containing spaces is compatible with generalized cerebral atrophy. Patchy and confluent T2/FLAIR hyperintensity within the periventricular and deep white matter both cerebral hemispheres most compatible chronic small vessel ischemic disease, mild in nature. Mild chronic microvascular ischemic changes noted within the pons as well. No abnormal foci of restricted diffusion to suggest acute or subacute ischemia. Gray-white matter differentiation maintained. No evidence for chronic infarction. No evidence for acute or chronic intracranial hemorrhage. No mass lesion, midline shift, or mass effect. No hydrocephalus. No extra-axial fluid collection. Minimal serpiginous enhancement within the left occipital lobe favored to reflect a small DVA (series 23, image 14). No other abnormal enhancement. Pituitary gland and suprasellar region within normal limits. Vascular: Abnormal flow void within the right ICA and, compatible with previous identified right ICA occlusion. Major intracranial vascular flow voids otherwise maintained.  Skull and upper cervical spine: Asymmetric fullness with enhancement at the right nasopharynx, compatible with previous identified nasopharyngeal mass. This measures approximately 15 x 27 mm on this exam (series 23, image 4). Finding concerning for possible recurrent or metastatic disease. There is asymmetric dural thickening with enhancement along the floor of the adjacent anteromedial left middle cranial fossa (series 24, image 21). Craniocervical junction within normal limits. Visualized upper cervical spine unremarkable. Bone marrow signal intensity normal. No scalp soft tissue abnormality. Sinuses/Orbits: Globes and orbital soft tissues within normal limits. Scattered mucosal thickening within the maxillary sinuses and ethmoidal air cells. No air-fluid level to suggest active sinus infection. Right-sided mastoid effusion, likely postobstructive. Small left mastoid effusion noted as well. MRI NECK FINDINGS: Study moderately degraded by motion artifact. Previously identified right nasopharyngeal mass again seen. Overall, this lesion measures approximately 22 x 34 x 28 mm on this exam (AP by transverse by craniocaudad). Mass appears to extend into the petrous right temporal bone, and is intimately associated with the petrous right ICA (series 20, image 4). Subtle asymmetric dural thickening seen just superiorly at the floor of the left middle cranial fossa may reflect early skullbase invasion or possibly reactive changes (Series 22, image 16). Asymmetric enhancement within the right eustachian tube again concerning for possible tumoral invasion. Postobstructive right mastoid effusion noted.  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 Associated intimal enhancement, suggesting that this is acute in nature. Previously questioned left palatine tonsillar carcinoma is not clearly seen on this exam. The left tonsil itself is mildly prominent as compared to the right with  relative partial effacement of the left parapharyngeal fat. However, no associated enhancement. 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 Mild asymmetric prominence with fullness within the adjacent right oropharyngeal mucosa. Inferiorly, remainder of the hypopharynx and supraglottic larynx unremarkable. True cords grossly normal. Subglottic airway clear. 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 Changes are worse within the right mandibular body as compared to the left. Suspected post radiation changes within the bilateral submandibular spaces, slightly greater on the left. No appreciable adenopathy identified within the neck. Thyroid gland is normal. Salivary glands including the parotid and submandibular glands within normal limits. Visualized upper mediastinum grossly unremarkable. Partially visualized lungs are grossly clear. Visualized osseous structures demonstrate no acute abnormality. Moderate multilevel degenerate spondylolysis noted within the visualized cervical spine, greatest at C6-7. IMPRESSION: MRI HEAD IMPRESSION: 1. No acute intracranial process identified. 2. Mild chronic microvascular ischemic disease. MRI NECK IMPRESSION: 1. 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 Mass involves the petrous aspect of the right temporal bone superiorly, and is intimately associated with the petrous right ICA. Subtle asymmetric dural thickening and enhancement within the adjacent left middle cranial fossa may reflect early skullbase/intracranial extension and/or reactive changes. 2. 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 3. Asymmetric prominence of the left palatine tonsil without associated enhancement, with asymmetric enhancement within the right palatine tonsil (see above discussion). Correlation with direct visualization recommended. 4. 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 Again, possible infection/osteomyelitis not excluded. Electronically Signed   By: Jeannine Boga M.D.   On: 01/01/2017 05:36   Dg Chest Port 1 View  Result Date: 01/03/2017 CLINICAL DATA:  Chest congestion.  Shortness of breath. EXAM: PORTABLE CHEST 1 VIEW COMPARISON:  12/31/2016 FINDINGS: The cardiac silhouette is stably enlarged. Mediastinal contours appear intact. There is no evidence of pneumothorax. There are mildly increased interstitial markings. Patchy airspace consolidation is seen in the right more than left lower lobes. There is probable left lower lobe atelectasis. Small subpulmonic effusions cannot be excluded. Osseous structures are without acute abnormality. Soft tissues are grossly normal. IMPRESSION: Interval development of mild increase of the interstitial markings and patchy airspace consolidation in right more than left lower lobe. This may represent multifocal pneumonia or asymmetric interstitial pulmonary edema. Electronically Signed   By: Fidela Salisbury M.D.   On: 01/03/2017 13:17   Dg Swallowing Func-speech Pathology  Result Date: 01/06/2017 Objective Swallowing Evaluation: Type of Study: MBS-Modified Barium Swallow Study Patient Details Name: Thang Craver MRN: [id number] Date of Birth: Dec 17, 1945 Today's Date: 01/06/2017 Time: SLP Start Time (ACUTE ONLY): 1417-SLP Stop Time (ACUTE ONLY): 1430 SLP Time Calculation (min) (ACUTE ONLY): 13 min Past Medical History: Past Medical History: Diagnosis Date . A-fib ([place])  . Atrial flutter ([place])  . Cancer ([place])   behind L ear . Hypertension  .  Prostate cancer ([place]) 2002 . Status post radiation 09/2011  Past Surgical History: Past Surgical History: Procedure Laterality Date . PROSTATECTOMY  2002 HPI: 71 y.o. gentleman with a history of head and neck cancer (diagnosed in 2012, treated in [place] with chemotherapy and radiation, details in dental clinic visit progress note from 11/25/16 by Dr. Enrique Sack), prostate cancer, HTN, and atrial fibrillation/flutter (CHADS-Vasc score of 3, he is not anticoagulated) who presents to the ED on 1/24/18for evaluation after having two falls at home on Saturday. The patient reports that he has had two distinct episodes of acute onset, involuntary, uncontrollable left leg tremor that ultimately causes him to fall. Both times, he has had increased difficulty getting back on his feet, reporting that it takes him 30 minutes to "recover". He denies LOC or associated confusion/post-ictal state. Likely secondary to probable TIA in the setting of high-grade carotid lesion/carotid stenosis. 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 States that he has dysphagia over the past 2 months and severe pain in him mouth. CXR 1/26: Interval development of mild increase of the interstitial markings and patchy airspace consolidation in right more than left lower lobe. This may represent multifocal pneumonia or asymmetric interstitial pulmonary edema. Note that patient seen by Dentist on 12/18 who noted Oral mass involving lower right quadrant. Patient given options including possible biopsy but has not made a decision to proceed.  Subjective: Pt cooperative/pleasant Assessment / Plan / Recommendation CHL IP CLINICAL IMPRESSIONS 01/06/2017 Therapy Diagnosis Moderate oral phase  dysphagia;Moderate pharyngeal phase dysphagia  Clinical Impression Pt has a moderate oropharyngeal dysphagia, suspect secondary to presence of nasopharyngeal mass. He has weak lingual propulsion and incomplete velopharyngeal closure that allows for nasal regurgitation of all consistencies. This mostly clears after the swallow as nasopharyngeal residue spills downward into the pharynx, but larger straw sips do travel deeper through the nasal cavity. He has a delayed swallow trigger that results in flash penetration of thin liquids via straw as well. Pt has decreased ability to establish intraoral pressure to assist in bolus propulsion through the pharynx, resulting in moderate, diffuse residue post-swallow. This can be reduced with a cued second swallow, but attempt at a chin tuck results in increased nasal regurgitation. Recommend to continue with Dys 1 diet and thin liquids by cup, although with strict control of bolus size and use of second swallow per bolus. Will continue to follow.  Impact on safety and function Mild aspiration risk;Moderate aspiration risk   CHL IP TREATMENT RECOMMENDATION 01/06/2017 Treatment Recommendations Therapy as outlined in treatment plan below   Prognosis 01/06/2017 Prognosis for Safe Diet Advancement Fair Barriers to Reach Goals Other (Comment) Barriers/Prognosis Comment -- CHL IP DIET RECOMMENDATION 01/06/2017 SLP Diet Recommendations Dysphagia 1 (Puree) solids;Thin liquid Liquid Administration via Cup;No straw Medication Administration Crushed with puree Compensations Slow rate;Small sips/bites;Multiple dry swallows after each bite/sip Postural Changes Remain semi-upright after after feeds/meals (Comment);Seated upright at 90 degrees   CHL IP OTHER RECOMMENDATIONS 01/06/2017 Recommended Consults Consider ENT evaluation Oral Care Recommendations Oral care BID Other Recommendations Have oral suction available   CHL IP FOLLOW UP RECOMMENDATIONS 01/06/2017 Follow up Recommendations Skilled  Nursing facility   [facility] IP FREQUENCY AND DURATION 01/06/2017 Speech Therapy Frequency (ACUTE ONLY) min 2x/week  Treatment Duration 2 weeks      CHL IP ORAL PHASE 01/06/2017 Oral Phase Impaired Oral - Pudding Teaspoon -- Oral - Pudding Cup -- Oral - Honey Teaspoon -- Oral - Honey Cup -- Oral - Nectar Teaspoon -- Oral - Nectar Cup -- Oral - Nectar Straw -- Oral - Thin Teaspoon -- Oral - Thin Cup Decreased velopharyngeal closure;Nasal reflux;Weak lingual manipulation Oral - Thin Straw Decreased velopharyngeal closure;Nasal reflux;Weak lingual manipulation Oral - Puree Decreased velopharyngeal closure;Nasal reflux;Weak lingual manipulation Oral - Mech Soft -- Oral - Regular -- Oral - Multi-Consistency -- Oral - Pill -- Oral Phase - Comment --  CHL IP PHARYNGEAL PHASE 01/06/2017 Pharyngeal Phase Impaired Pharyngeal- Pudding Teaspoon -- Pharyngeal -- Pharyngeal- Pudding Cup -- Pharyngeal -- Pharyngeal- Honey Teaspoon -- Pharyngeal -- Pharyngeal- Honey Cup -- Pharyngeal -- Pharyngeal- Nectar Teaspoon -- Pharyngeal -- Pharyngeal- Nectar Cup -- Pharyngeal -- Pharyngeal- Nectar Straw -- Pharyngeal -- Pharyngeal- Thin Teaspoon -- Pharyngeal -- Pharyngeal- Thin Cup Delayed swallow initiation-pyriform sinuses;Pharyngeal residue - valleculae;Pharyngeal residue - pyriform Pharyngeal -- Pharyngeal- Thin Straw Delayed swallow initiation-pyriform sinuses;Pharyngeal residue - valleculae;Pharyngeal residue - pyriform;Penetration/Aspiration before swallow Pharyngeal Material enters airway, remains ABOVE vocal cords then ejected out Pharyngeal- Puree Delayed swallow initiation-pyriform sinuses;Pharyngeal residue - valleculae;Pharyngeal residue - pyriform Pharyngeal -- Pharyngeal- Mechanical Soft -- Pharyngeal -- Pharyngeal- Regular -- Pharyngeal -- Pharyngeal- Multi-consistency -- Pharyngeal -- Pharyngeal- Pill -- Pharyngeal -- Pharyngeal Comment --  CHL IP CERVICAL ESOPHAGEAL PHASE 01/06/2017 Cervical Esophageal Phase WFL Pudding Teaspoon --  Pudding Cup -- Honey Teaspoon -- Honey Cup -- Nectar Teaspoon -- Nectar Cup -- Nectar Straw -- Thin Teaspoon -- Thin Cup -- Thin Straw -- Puree -- Mechanical Soft -- Regular -- Multi-consistency -- Pill -- Cervical Esophageal Comment -- CHL IP GO 01/02/2017 Functional Assessment Tool Used (No Data) Functional Limitations Other Speech Language Pathology Swallow Current Status [phone]) (None) Swallow Goal Status [id number]) (None) Swallow Discharge Status [id number]) (None) Motor Speech Current Status [id number]) (None) Motor Speech Goal Status [id number]) (None) Motor Speech Goal Status [id number]) (None) Spoken Language Comprehension Current Status [id number]) (None) Spoken Language Comprehension Goal Status [id number]) (None) Spoken Language Comprehension Discharge Status [phone]) (None) Spoken Language Expression Current Status [phone]) (None) Spoken Language Expression Goal Status [id number]) (None) Spoken Language Expression Discharge Status [phone]) (None) Attention Current Status [id number]) (None) Attention Goal Status [id number]) (None) Attention Discharge Status [id number]) (None) Memory Current Status [id number]) (None) Memory Goal Status [id number]) (None) Memory Discharge Status [id number]) (None) Voice Current Status [id number]) (None) Voice Goal Status [id number]) (None) Voice Discharge Status [id number]) (None) Other Speech-Language Pathology Functional Limitation [phone]) [facility] Other Speech-Language Pathology Functional Limitation Goal Status [id number]) (None) Other Speech-Language Pathology Functional Limitation Discharge Status [phone]) (None) Germain Osgood 01/06/2017, 3:12 PM  Germain Osgood, M.A. CCC-SLP [phone]              ASSESSMENT/PLAN:  Generalized weakness  - for rehabilitation, PT and OT, or therapeutic strengthening exercises; fall precautions  Probable TIA - had left leg tremors/weakness, in the setting of high-grade carotid lesion/carotid stenosis; Neurology was consulted. he was started on IV heparin and changed to Eliquis; follow-up with  neurology in 6 weeks  Atrial fibrillation with RVR - rate controlled; he was initially placed on Cardizem drip which was discontinued due to hypotension, he was put on amiodarone drip, TSH within normal limits; cardiac enzymes negative and 2-D echo with EF of 25-30% with global hypokinesis; he was transitioned to oral amiodarone 200 mg twice a day; cardiology was consulted; was started on  Eliquis, continue digoxin  Post obstructive right middle ear and mastoid effusions - follow-up with ENT, Dr. Constance Holster  Nasopharyngeal mass - concern for recurrent G Moore versus metastatic disease; he has a history of head and neck cancer involving the left side of the head behind his ear approximately 5 years ago for which she had chemoradiation in [place], [place]; will follow up with ENT, Dr. Constance Holster  Acute complete occlusion of right ICA - was started on IV heparin and then changed to Eliquis; neurology, Dr. 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 by the duplex on 08/16/15 revealing 4.3 cm distal aneurysm; monitor   Tobacco abuse - start nicotine 21 mg/24 hour 1 patch to skin Q D  Dysphagia -  ST evaluation for swallowing functions; aspiration precautions  Mouth pain - was evaluated by dentistry on 11/25/16 and was noted to have a history of acute pulpitis symptoms, chronic apical periodontitis, dental , multiple retained roots segments, chronic periodontitis with bone loss, mobility, gingival recession and multiple missing  teeth; patient refused treatment options; will use Viscous Lidocaine when necessary; follow-up with dentist; continue Norco 7.5/325 mg 1/2 to 1 tab by mouth every 6 hours when necessary  Anxiety - mood this is stable; continue Klonopin 0.5 mg 1 tab by mouth twice a day when necessary   Hyponatremia - Na 138; check BMP on 01/13/17  Hypomagnesemia - MG 1.6, check Mg level     Goals of care:  Short-term rehabilitation   Aynsley Fleet C. Livingston - NP [employer] [phone]

## 2017-01-13 LAB — BASIC METABOLIC PANEL
BUN: 12 mg/dL (ref 4–21)
Creatinine: 0.7 mg/dL (ref 0.6–1.3)
GLUCOSE: 85 mg/dL
Potassium: 4.4 mmol/L (ref 3.4–5.3)
Sodium: 132 mmol/L — AB (ref 137–147)

## 2017-01-16 ENCOUNTER — Ambulatory Visit (INDEPENDENT_AMBULATORY_CARE_PROVIDER_SITE_OTHER): Payer: Medicare Other | Admitting: Internal Medicine

## 2017-01-16 ENCOUNTER — Ambulatory Visit: Payer: Self-pay

## 2017-01-16 DIAGNOSIS — I959 Hypotension, unspecified: Secondary | ICD-10-CM

## 2017-01-16 DIAGNOSIS — I6521 Occlusion and stenosis of right carotid artery: Secondary | ICD-10-CM

## 2017-01-16 DIAGNOSIS — Z9221 Personal history of antineoplastic chemotherapy: Secondary | ICD-10-CM

## 2017-01-16 DIAGNOSIS — Z9079 Acquired absence of other genital organ(s): Secondary | ICD-10-CM

## 2017-01-16 DIAGNOSIS — I5022 Chronic systolic (congestive) heart failure: Secondary | ICD-10-CM

## 2017-01-16 DIAGNOSIS — R001 Bradycardia, unspecified: Secondary | ICD-10-CM

## 2017-01-16 DIAGNOSIS — G459 Transient cerebral ischemic attack, unspecified: Secondary | ICD-10-CM

## 2017-01-16 DIAGNOSIS — I4891 Unspecified atrial fibrillation: Secondary | ICD-10-CM | POA: Diagnosis not present

## 2017-01-16 DIAGNOSIS — Z7901 Long term (current) use of anticoagulants: Secondary | ICD-10-CM

## 2017-01-16 DIAGNOSIS — Z8589 Personal history of malignant neoplasm of other organs and systems: Secondary | ICD-10-CM

## 2017-01-16 DIAGNOSIS — F17210 Nicotine dependence, cigarettes, uncomplicated: Secondary | ICD-10-CM

## 2017-01-16 DIAGNOSIS — Z79899 Other long term (current) drug therapy: Secondary | ICD-10-CM

## 2017-01-16 DIAGNOSIS — Z923 Personal history of irradiation: Secondary | ICD-10-CM

## 2017-01-16 DIAGNOSIS — Z8673 Personal history of transient ischemic attack (TIA), and cerebral infarction without residual deficits: Secondary | ICD-10-CM

## 2017-01-16 DIAGNOSIS — J392 Other diseases of pharynx: Secondary | ICD-10-CM

## 2017-01-16 HISTORY — DX: Chronic systolic (congestive) heart failure: I50.22

## 2017-01-16 MED ORDER — METOPROLOL SUCCINATE ER 25 MG PO TB24: 12.5000 mg | ORAL_TABLET | Freq: Two times a day (BID) | ORAL | 0 refills | Status: AC

## 2017-01-16 NOTE — Progress Notes (Signed)
CC: follow up of atrial fibrillation   HPI: Mr.Tyler Holland is a 71 y.o. with past medical history as outlined below who presents to clinic to establish care and for follow up of his chronic medical conditions. He denies any new concerns or complaints.  Please see problem list for status of the pt's chronic medical problems.  Past Medical History:  Diagnosis Date  . A-fib ([place])   . Ascending aortic aneurysm ([place])   . Atrial flutter ([place])   . Cancer ([place])    behind L ear  . Cardiomyopathy ([place])   . Dehydration 12/2016  . Dysphagia   . Head and neck cancer ([place])   . Hypertension   . Hypomagnesemia   . ICAO (internal carotid artery occlusion), right   . Nasopharyngeal mass   . Prostate cancer ([place]) 2002  . Status post radiation 09/2011  . TIA (transient ischemic attack) 12/2016  . Transient left leg weakness    Past Surgical History:  Procedure Laterality Date  . PROSTATECTOMY  2002   He believes he has some cardiac disease in his family but is not completely sure of family history.   Social History   Social History  . Marital status: Single    Spouse name: N/A  . Number of children: 0  . Years of education: N/A   Occupational History  . Not on file.   Social History Main Topics  . Smoking status: Current Some Day Smoker    Packs/day: 0.50    Years: 50.00  . Smokeless tobacco: Never Used  . Alcohol use Yes     Comment: 2-3 drinks per day  . Drug use: No  . Sexual activity: Not on file   Other Topics Concern  . Not on file   Social History Narrative  . No narrative on file     Review of Systems:  Please see each problem below for a pertinent review of systems.  Physical Exam:  Vitals:   01/16/17 1434  BP: (!) 91/50  Pulse: (!) 54  Temp: 98 F (36.7 C)  TempSrc: Oral  SpO2: 95%  Weight: 177 lb (80.3 kg)   Physical Exam  HENT:  Head: Normocephalic and atraumatic.  Eyes: Conjunctivae are normal. No scleral icterus.  Cardiovascular: Normal  rate, regular rhythm and intact distal pulses.   Pulmonary/Chest: Effort normal and breath sounds normal. No respiratory distress.  Abdominal: Soft. He exhibits no distension. There is no tenderness.  Neurological: He is alert.  Skin: Skin is warm and dry.  Psychiatric: He has a normal mood and affect. His behavior is normal.   Assessment & Plan:   See Encounters Tab for problem based charting.  Hypotension and bradycardia  Home dose of metoprolol was increased to 25mg  BID from 12.5 mg BID in an attempt to achieve rate control during recent hospitalization. Today he is hypotensive and bradycardic and describes lightheadedness with standing. He was found to have right ICA occlusion as well so preventing hypotension to allow for perfusion through this area of stenosis is vital. - Decreased to metoprolol 12.5 mg BID  -RTC 1 month   Nasopharyngeal mass Found incidentally on MRI head during hospitalization last week. He has a history of head and neck cancer, does not know the origin but went through chemotherapy x2 rounds and radiation. Was treated at the [facility] by Dr. Guinevere Scarlet ph# [phone], F# [phone]. Has apt scheduled with ENT Dr. Constance Holster 2/12, may have biopsy  performed at that time.  -referral to oncology  Afib with RVR/Atrial flutter  Currently in regular rhythm. CHADSVASC =3. Meds include amiodarone and digoxin for rhythm control, metoprolol for rate control, and Eliquis for secondary stroke prevention.  -He is overly beta blocked today so I have decreased metoprolol to 12.5 mg BID ( was previously on 25 mg BID) -continue current medication regiment.   Hx of TIA  Recently presented to the ED with transient left leg tremor and found to have high grade carotid lesions. Symptoms have resolved. He is on Eliquis for secondary stroke prevention.  -continue Eliquis   Patient seen with Dr. Beryle Beams

## 2017-01-16 NOTE — Patient Instructions (Addendum)
It was a pleasure to meet you today Mr. Sapp,   For the mass on your neck, call to schedule a follow up appointment with Dr. Flonnie Overman [facility] [place]  [address]  [place], [place] [address]  [phone]  For your lightheadedness, start taking a lower dose of the metoprolol 12.5 mg twice daily.  Please schedule a follow up appointment in 1 month

## 2017-01-16 NOTE — Progress Notes (Signed)
Medicine attending: I personally interviewed and briefly examined this patient on the day of the patient visit and reviewed pertinent clinical ,laboratory, and radiographic data  with resident physician Dr. Ledell Noss and we discussed a management plan. New onset A fib; recently hospitalized; low EF; now on digoxin and a beta-blocker which needs to be decreased - pulse 50; getting orthostatic. 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 Trismus. Poor dentition. Dysphagia for solids. Dysarthria.   He does not want aggressive Rx but I told him there may be new options which have only limited toxicity (i.e. EGFR receptor inhibitors or immune checkpoint inhibitors). He is scheduled for bx confirmation with Dr Constance Holster, ENT. Will will make referral to medical Oncology, Dr Alvy Bimler. He will also need Rad Onc eval.

## 2017-01-17 ENCOUNTER — Encounter: Payer: Self-pay | Admitting: *Deleted

## 2017-01-17 ENCOUNTER — Encounter: Payer: Self-pay | Admitting: Internal Medicine

## 2017-01-17 ENCOUNTER — Non-Acute Institutional Stay (SKILLED_NURSING_FACILITY): Payer: Medicare Other | Admitting: Internal Medicine

## 2017-01-17 ENCOUNTER — Telehealth: Payer: Self-pay | Admitting: *Deleted

## 2017-01-17 DIAGNOSIS — C760 Malignant neoplasm of head, face and neck: Secondary | ICD-10-CM

## 2017-01-17 DIAGNOSIS — J392 Other diseases of pharynx: Secondary | ICD-10-CM

## 2017-01-17 DIAGNOSIS — I4891 Unspecified atrial fibrillation: Secondary | ICD-10-CM | POA: Diagnosis not present

## 2017-01-17 DIAGNOSIS — I6521 Occlusion and stenosis of right carotid artery: Secondary | ICD-10-CM | POA: Diagnosis not present

## 2017-01-17 DIAGNOSIS — I5022 Chronic systolic (congestive) heart failure: Secondary | ICD-10-CM

## 2017-01-17 DIAGNOSIS — I959 Hypotension, unspecified: Secondary | ICD-10-CM | POA: Diagnosis not present

## 2017-01-17 DIAGNOSIS — R1310 Dysphagia, unspecified: Secondary | ICD-10-CM | POA: Diagnosis not present

## 2017-01-17 DIAGNOSIS — G459 Transient cerebral ischemic attack, unspecified: Secondary | ICD-10-CM | POA: Diagnosis not present

## 2017-01-17 NOTE — Progress Notes (Signed)
Provider:  Rexene Edison. Mariea Clonts, D.O., C.M.D. Location:  [place] Room Number: 702-P Place of Service:  SNF (31)  PCP: No PCP Per Patient Patient Care Team: No Pcp Per Patient as PCP - General (General Practice) Jule Ser, DO as PCP - Internal Medicine (Internal Medicine)  Extended Emergency Contact Information Primary Emergency Contact: [name],[name] Address: [address] [place]          [place], [place] [address] [place] Phone: [phone] Relation: Relative  Code Status: DNR Goals of Care: Advanced Directive information Advanced Directives 01/17/2017  Does Patient Have a Medical Advance Directive? Yes  Type of Advance Directive Out of facility DNR (pink MOST or yellow form)  Does patient want to make changes to medical advance directive? No - Patient declined  Would patient like information on creating a medical advance directive? -  8 Chief Complaint  Patient presents with  . New Admit To SNF    New Admission Visit     HPI: Patient is a 71 y.o. male seen today for admission to [facility] for short term rehab s/p complicated hospitalization.  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    He was admitted 1/24 to the hospital with transietn leg tremors, weakness and two falls.  He was felt to have had a TIA and was noted to have a high grade carotid lesion (R ICA).  He is to f/u with Dr. Leonie Man in 6 wks from his discharge 1/31.  He's receiving PT, OT.  In the midst, he also had afib with RVR with CHADS2vasc of 6.  His EF was noted to be 25-30%.  He was placed on eliquis, aldactone, amiodarone and dig.  He's not on an ace/arb or entresto (ace was stopped in hospital).  He's had some low bps and when he went to the internal med clinic today his metoprolol as reduced to 12.5mg  po bid (lopressor)--done by Dr. Ledell Noss  in the clinic.  He requested that Dr. Einar Gip be informed of this med change.    He also had postobstructive right middle ear and mastoid effusions.  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 Constance Holster next week.  He's been c/o mouth pain and appears to have an abscess possibly as well.  He was continuing to chew snuff packets though and requested more frequent lidodaine.    Re: his dysphagia, he is getting ST and is on aspiration precautions.    His electrolytes were abnormal with hyponatremia and hypomagnesemia which were repleted and he has a f/u bmp 2/5.    His nicoderm patch needs tapering and counseled that he should not be wearing that and still chewing tobacco.    Discussed palliative care with him to discuss pain management.    Past Medical History:  Diagnosis Date  . A-fib ([place])   . Ascending aortic aneurysm ([place])   . Atrial flutter ([place])   . Cancer ([place])    behind L ear  . Cardiomyopathy ([place])   . Chronic systolic congestive heart failure ([place]) 01/16/2017  . Dysphagia   . Head and neck cancer ([place])   . Hypertension   . ICAO (internal carotid artery occlusion), right   . Nasopharyngeal mass   . Prostate cancer ([place]) 2002  .  Status post radiation 09/2011  . TIA (transient ischemic attack) 12/2016  . Transient left leg weakness    Past Surgical History:  Procedure Laterality Date  . PROSTATECTOMY  2002    Social History   Social History  . Marital status: Single    Spouse name: N/A  . Number of children: 0  . Years of education: N/A   Social History Main Topics  . Smoking status: Current Some Day Smoker    Packs/day: 0.50    Years: 50.00  . Smokeless tobacco: Never Used  . Alcohol use Yes     Comment: 2-3 drinks per day  . Drug use: No  . Sexual activity: Not Asked   Other Topics Concern  . None   Social History Narrative  . None    reports that he has been smoking.  He has a 25.00  pack-year smoking history. He has never used smokeless tobacco. He reports that he drinks alcohol. He reports that he does not use drugs.  Functional Status Survey:    No family history on file.  Health Maintenance  Topic Date Due  . Hepatitis C Screening  [birth year]  . TETANUS/TDAP  09/26/1965  . COLONOSCOPY  09/26/1996  . ZOSTAVAX  09/26/2006  . INFLUENZA VACCINE  07/09/2016  . PNA vac Low Risk Adult (2 of 2 - PCV13) 01/04/2018    Allergies  Allergen Reactions  . Fish Allergy Swelling    Fresh water fish caused throat to swell (child)  . Penicillins Other (See Comments)    Unknown childhood allergic reaction    Allergies as of 01/17/2017      Reactions   Fish Allergy Swelling   Fresh water fish caused throat to swell (child)   Penicillins Other (See Comments)   Unknown childhood allergic reaction      Medication List       Accurate as of 01/17/17  4:03 PM. Always use your most recent med list.          amiodarone 200 MG tablet Commonly known as:  PACERONE Take 1 tablet (200 mg total) by mouth 2 (two) times daily.   apixaban 5 MG Tabs tablet Commonly known as:  ELIQUIS Take 1 tablet (5 mg total) by mouth 2 (two) times daily.   atorvastatin 20 MG tablet Commonly known as:  LIPITOR Take 1 tablet (20 mg total) by mouth daily.   clonazePAM 0.5 MG tablet Commonly known as:  KLONOPIN Take 0.5 mg by mouth 2 (two) times daily as needed for anxiety.   digoxin 0.25 MG tablet Commonly known as:  LANOXIN Take 1 tablet (0.25 mg total) by mouth daily.   HYDROcodone-acetaminophen 7.5-325 MG tablet Commonly known as:  NORCO Take 0.5-1 tablets by mouth every 6 (six) hours as needed for moderate pain.   lidocaine 2 % solution Commonly known as:  XYLOCAINE Use as directed 15 mLs in the mouth or throat every 4 (four) hours as needed for mouth pain.   magnesium oxide 400 MG tablet Commonly known as:  MAG-OX Take 400 mg by mouth daily.   metoprolol succinate 25 MG 24  hr tablet Commonly known as:  TOPROL-XL Take 0.5 tablets (12.5 mg total) by mouth 2 (two) times daily.   nicotine 21 mg/24hr patch Commonly known as:  NICODERM CQ - dosed in mg/24 hours Place 21 mg onto the skin daily.   spironolactone 25 MG tablet Commonly known as:  ALDACTONE Take 1 tablet (25 mg total) by mouth daily.  UNABLE TO FIND Med Name: Med pass 120 mL by mouth daily       Review of Systems  Constitutional: Positive for malaise/fatigue. Negative for chills and fever.  HENT: Positive for congestion, ear pain and sinus pain. Negative for hearing loss and sore throat.        Mouth pain  Eyes: Negative for blurred vision.  Respiratory: Negative for cough, sputum production and shortness of breath.   Cardiovascular: Negative for chest pain, palpitations and leg swelling.  Gastrointestinal: Negative for abdominal pain and constipation.  Genitourinary: Negative for dysuria.  Musculoskeletal: Positive for falls. Negative for joint pain.  Skin: Negative for itching and rash.  Neurological: Positive for dizziness and weakness. Negative for tingling, tremors, sensory change, speech change, focal weakness, seizures, loss of consciousness and headaches.  Psychiatric/Behavioral: Negative for depression and memory loss. The patient does not have insomnia.     Vitals:   01/17/17 1557  BP: 118/64  Pulse: (!) 56  Resp: 16  Temp: 98.7 F (37.1 C)  TempSrc: Oral  SpO2: 98%  Weight: 179 lb 3.2 oz (81.3 kg)  Height: 5\' 10"  (1.778 m)   Body mass index is 25.71 kg/m. Physical Exam  Constitutional: He is oriented to person, place, and time.  Chronically ill appearing male resting in bed, poor hygiene  HENT:  Head: Normocephalic and atraumatic.  Right Ear: External ear normal.  Left Ear: External ear normal.  Halitosis, possible abscess right lower tooth (yellow appearance, but pt also had a snuff packet that he had to take out for me to check his mouth and throat)  Eyes: EOM  are normal. Pupils are equal, round, and reactive to light.  Neck: Neck supple. No JVD present.  Cardiovascular: Normal rate, regular rhythm and normal heart sounds.   Pulmonary/Chest: Effort normal and breath sounds normal. He has no wheezes. He has no rales.  Abdominal: Soft. Bowel sounds are normal.  Musculoskeletal: Normal range of motion.  Lymphadenopathy:    He has no cervical adenopathy.  Neurological: He is alert and oriented to person, place, and time.  Skin: Skin is warm and dry. Capillary refill takes less than 2 seconds.  Psychiatric: He has a normal mood and affect.  Sarcastic responses    Labs reviewed: Basic Metabolic Panel:  Recent Labs  01/06/17 0137 01/07/17 0222 01/08/17 0718 01/13/17  NA 133* 134* 128* 132*  K 4.0 3.9 4.2 4.4  CL 98* 98* 95*  --   CO2 26 28 25   --   GLUCOSE 105* 97 97  --   BUN 11 13 9 12   CREATININE 0.69 0.64 0.66 0.7  CALCIUM 8.8* 8.7* 8.7*  --   MG 1.7 1.7 1.6*  --    Liver Function Tests:  Recent Labs  12/31/16 1441 01/01/17 0934  AST 22 21  ALT 15* 15*  ALKPHOS 75 60  BILITOT 1.3* 1.0  PROT 6.8 5.5*  ALBUMIN 3.2* 2.7*    Recent Labs  12/31/16 1441  LIPASE 19   No results for input(s): AMMONIA in the last 8760 hours. CBC:  Recent Labs  01/01/17 0934  01/05/17 0207 01/06/17 0137 01/08/17 0718  WBC 7.1  < > 6.7 5.8 7.2  NEUTROABS 5.8  --   --   --   --   HGB 15.4  < > 14.8 14.9 15.8  HCT 45.2  < > 43.9 44.5 46.5  MCV 99.8  < > 99.1 99.1 98.7  PLT 152  < > 175 192  289  < > = values in this interval not displayed. Cardiac Enzymes:  Recent Labs  12/31/16 1850 01/01/17 0934 01/01/17 1516 01/01/17 2119  CKTOTAL 80  --   --   --   TROPONINI 0.03* <0.03 <0.03 <0.03   BNP: Invalid input(s): POCBNP Lab Results  Component Value Date   HGBA1C 5.7 (H) 01/03/2017   Lab Results  Component Value Date   TSH 2.810 01/01/2017   No results found for: VITAMINB12 No results found for: FOLATE No results found  for: IRON, TIBC, FERRITIN  Imaging and Procedures obtained prior to SNF admission: Dg Chest 2 View  Result Date: 12/31/2016 CLINICAL DATA:  Patient fell 5 days ago. For age trial fibrillation, atrial flutter and hypertension EXAM: CHEST  2 VIEW COMPARISON:  None. FINDINGS: Cardiomegaly with aortic atherosclerosis. No pneumonic consolidation. Bibasilar atelectasis and/or scarring left greater than right. No overt pulmonary edema or pneumothorax. Vascular calcifications are seen bilaterally along the axillary arteries. IMPRESSION: Cardiomegaly with aortic atherosclerosis. No acute pulmonary disease. Bibasilar atelectasis and/or scarring. Electronically Signed   By: Ashley Royalty M.D.   On: 12/31/2016 20:03   Dg Wrist Complete Left  Result Date: 12/31/2016 CLINICAL DATA:  Fall with pain.  Patient fell 5 days ago. EXAM: LEFT WRIST - COMPLETE 3+ VIEW COMPARISON:  None. FINDINGS: No fracture. No subluxation or dislocation. Degenerative changes are seen in the first carpometacarpal joint. IMPRESSION: 1. No acute findings. 2. Degenerative changes first carpometacarpal joint. Electronically Signed   By: Misty Stanley M.D.   On: 12/31/2016 20:01   Ct Head Wo Contrast  Result Date: 12/31/2016 CLINICAL DATA:  Fall 3 days ago.  Headache.  Initial encounter. EXAM: CT HEAD WITHOUT CONTRAST TECHNIQUE: Contiguous axial images were obtained from the base of the skull through the vertex without intravenous contrast. COMPARISON:  None. FINDINGS: Brain: No evidence of acute infarction, hemorrhage, hydrocephalus, extra-axial collection or mass lesion/mass effect. Mild diffuse cerebral atrophy and chronic small vessel disease. Vascular: No hyperdense vessel or unexpected calcification. Skull: Normal. Negative for fracture or focal lesion. Sinuses/Orbits: Right mastoid effusion noted. Other: None. IMPRESSION: No acute intracranial abnormality. Mild cerebral atrophy and chronic small vessel disease. Right mastoid effusion.  Electronically Signed   By: Earle Gell M.D.   On: 12/31/2016 20:36   Ct Soft Tissue Neck W Contrast  Addendum Date: 12/31/2016   ADDENDUM REPORT: 12/31/2016 21:45 ADDENDUM: Postobstructive RIGHT middle ear and mastoid effusions. Bone windows now submitted. Slight widening and irregularity of the RIGHT bony eustachian tube concerning for tumoral invasion. Electronically Signed   By: Elon Alas M.D.   On: 12/31/2016 21:45   Result Date: 12/31/2016 CLINICAL DATA:  Oral mass. Hypotensive, weakness. History of LEFT retroauricular cancer, prostate cancer. EXAM: CT NECK WITH CONTRAST TECHNIQUE: Multidetector CT imaging of the neck was performed using the standard protocol following the bolus administration of intravenous contrast. CONTRAST:  75 cc  ISOVUE-300 IOPAMIDOL (ISOVUE-300) INJECTION 61% COMPARISON:  None. FINDINGS: Pharynx and larynx: Approximately 16 x 22 x 19 mm mass RIGHT nasopharyngeal/ upper palatine tonsillar mass effacing the RIGHT fossa of Rosenmller, possible invasion of the RIGHT cavernous sinus, incompletely assessed. Hypopharyngeal edema compatible with post radiation change narrowing the LEFT vallecula. Patent piriform sinuses. Asymmetric fullness LEFT palatine tonsil with LEFT parapharyngeal fat stranding. Amorphous LEFT pterygoid muscles. Salivary glands: Fat stranding bilateral submandibular space. Thyroid: Normal. Lymph nodes: Matted LEFT lateral pharyngeal lymph nodes. VASCULAR: Dense presumed hematoma RIGHT internal carotid artery origin with occlusion of the RIGHT internal carotid  artery to the included cavernous segment. Limited intracranial: Normal. Visualized orbits: Normal. Mastoids and visualized paranasal sinuses: Bilateral maxillary sinus mucosal retention cyst. RIGHT middle ear in mastoid effusion. Small LEFT mastoid effusion. Skeleton: Poor dentition with multiple dental caries, periapical lucencies with bony re- absorption of the LEFT greater than RIGHT alveolar ridge  and LEFT angle of the mandible. Chronic deformity RIGHT club proximal clavicle. Multilevel severe degenerative changes cervical spine. Upper chest: Ascending aortic aneurysm at 4.2 cm with moderate calcific atherosclerosis. Other: Thickened platysma. IMPRESSION: Limited assessment due to lack of prior imaging. 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 Possible skullbase invasion would be better characterized on MRI neck with contrast. 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 **An incidental finding of potential clinical significance has been found. 4.2 cm ascending aortic aneurysm. Recommend annual imaging followup by CTA or MRA. This recommendation follows 2010 ACCF/AHA/AATS/ACR/ASA/SCA/SCAI/SIR/STS/SVM Guidelines for the Diagnosis and Management of Patients with Thoracic Aortic Disease. Circulation. 2010; [id number]** Acute findings discussed with and reconfirmed by PA CHRISTOPHER LAWYER on 12/31/2016 at 9:07 pm. Electronically Signed: By: Elon Alas M.D. On: 12/31/2016 21:09   Mr Jeri Cos [id number] Contrast  Result Date: 01/01/2017 CLINICAL DATA:  Initial evaluation for transient left leg weakness, tremor. EXAM: MRI HEAD WITHOUT AND WITH CONTRAST TECHNIQUE: Multiplanar, multiecho pulse sequences of the brain and surrounding structures were obtained without and with intravenous contrast. CONTRAST:  15 cc of MultiHance. COMPARISON:  Prior CT from 12/31/2016. FINDINGS: Brain: Diffuse prominence of the CSF containing spaces is compatible with generalized cerebral atrophy. Patchy and confluent T2/FLAIR hyperintensity within the periventricular and  deep white matter both cerebral hemispheres most compatible chronic small vessel ischemic disease, mild in nature. Mild chronic microvascular ischemic changes noted within the pons as well. No abnormal foci of restricted diffusion to suggest acute or subacute ischemia. Gray-white matter differentiation maintained. No evidence for chronic infarction. No evidence for acute or chronic intracranial hemorrhage. No mass lesion, midline shift, or mass effect. No hydrocephalus. No extra-axial fluid collection. Minimal serpiginous enhancement within the left occipital lobe favored to reflect a small DVA (series 23, image 14). No other abnormal enhancement. Pituitary gland and suprasellar region within normal limits. Vascular: Abnormal flow void within the right ICA and, compatible with previous identified right ICA occlusion. Major intracranial vascular flow voids otherwise maintained. Skull and upper cervical spine: Asymmetric fullness with enhancement at the right nasopharynx, compatible with previous identified nasopharyngeal mass. This measures approximately 15 x 27 mm on this exam (series 23, image 4). Finding concerning for possible recurrent or metastatic disease. There is asymmetric dural thickening with enhancement along the floor of the adjacent anteromedial left middle cranial fossa (series 24, image 21). Craniocervical junction within normal limits. Visualized upper cervical spine unremarkable. Bone marrow signal intensity normal. No scalp soft tissue abnormality. Sinuses/Orbits: Globes and orbital soft tissues within normal limits. Scattered mucosal thickening within the maxillary sinuses and ethmoidal air cells. No air-fluid level to suggest active sinus infection. Right-sided mastoid effusion, likely postobstructive. Small left mastoid effusion noted as well. MRI NECK FINDINGS: Study moderately degraded by motion artifact. Previously identified right nasopharyngeal mass again seen. Overall, this lesion  measures approximately 22 x 34 x 28 mm on this exam (AP  by transverse by craniocaudad). Mass appears to extend into the petrous right temporal bone, and is intimately associated with the petrous right ICA (series 20, image 4). Subtle asymmetric dural thickening seen just superiorly at the floor of the left middle cranial fossa may reflect early skullbase invasion or possibly reactive changes (Series 22, image 16). Asymmetric enhancement within the right eustachian tube again concerning for possible tumoral invasion. Postobstructive right mastoid effusion noted. 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 Associated intimal enhancement, suggesting that this is acute in nature. Previously questioned left palatine tonsillar carcinoma is not clearly seen on this exam. The left tonsil itself is mildly prominent as compared to the right with relative partial effacement of the left parapharyngeal fat. However, no associated enhancement. 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 Mild asymmetric prominence with fullness within the adjacent right oropharyngeal mucosa. Inferiorly, remainder of the hypopharynx and supraglottic larynx unremarkable. True cords grossly normal. Subglottic airway clear. 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 Changes are worse within the right mandibular body as compared to the left. Suspected post radiation changes within the bilateral submandibular spaces, slightly greater on the left. No appreciable adenopathy identified within the neck. Thyroid gland is normal. Salivary glands including the parotid and submandibular glands within normal limits. Visualized upper mediastinum grossly unremarkable. Partially visualized lungs are grossly clear. Visualized osseous  structures demonstrate no acute abnormality. Moderate multilevel degenerate spondylolysis noted within the visualized cervical spine, greatest at C6-7. IMPRESSION: MRI HEAD IMPRESSION: 1. No acute intracranial process identified. 2. Mild chronic microvascular ischemic disease. MRI NECK IMPRESSION: 1. 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 Mass involves the petrous aspect of the right temporal bone superiorly, and is intimately associated with the petrous right ICA. Subtle asymmetric dural thickening and enhancement within the adjacent left middle cranial fossa may reflect early skullbase/intracranial extension and/or reactive changes. 2. 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 3. Asymmetric prominence of the left palatine tonsil without associated enhancement, with asymmetric enhancement within the right palatine tonsil (see above discussion). Correlation with direct visualization recommended. 4. 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 Again, possible infection/osteomyelitis not excluded. Electronically Signed   By: Jeannine Boga M.D.   On: 01/01/2017 05:36   Mr Neck Soft Tissue Only W Wo Contrast  Result Date: 01/01/2017 CLINICAL DATA:  Initial evaluation for transient left leg weakness, tremor. EXAM: MRI HEAD WITHOUT AND WITH CONTRAST TECHNIQUE: Multiplanar, multiecho pulse sequences of the brain and surrounding structures were obtained without and with intravenous contrast. CONTRAST:  15 cc of MultiHance. COMPARISON:  Prior CT from 12/31/2016. FINDINGS: Brain: Diffuse prominence of the CSF containing spaces is compatible with generalized cerebral atrophy. Patchy and confluent T2/FLAIR hyperintensity within the  periventricular and deep white matter both cerebral hemispheres most compatible chronic small vessel ischemic disease, mild in nature. Mild chronic microvascular ischemic changes noted within the pons as well. No abnormal foci of restricted diffusion to suggest acute or subacute ischemia. Gray-white matter differentiation maintained. No evidence for chronic infarction. No evidence for acute or chronic intracranial hemorrhage.  No mass lesion, midline shift, or mass effect. No hydrocephalus. No extra-axial fluid collection. Minimal serpiginous enhancement within the left occipital lobe favored to reflect a small DVA (series 23, image 14). No other abnormal enhancement. Pituitary gland and suprasellar region within normal limits. Vascular: Abnormal flow void within the right ICA and, compatible with previous identified right ICA occlusion. Major intracranial vascular flow voids otherwise maintained. Skull and upper cervical spine: Asymmetric fullness with enhancement at the right nasopharynx, compatible with previous identified nasopharyngeal mass. This measures approximately 15 x 27 mm on this exam (series 23, image 4). Finding concerning for possible recurrent or metastatic disease. There is asymmetric dural thickening with enhancement along the floor of the adjacent anteromedial left middle cranial fossa (series 24, image 21). Craniocervical junction within normal limits. Visualized upper cervical spine unremarkable. Bone marrow signal intensity normal. No scalp soft tissue abnormality. Sinuses/Orbits: Globes and orbital soft tissues within normal limits. Scattered mucosal thickening within the maxillary sinuses and ethmoidal air cells. No air-fluid level to suggest active sinus infection. Right-sided mastoid effusion, likely postobstructive. Small left mastoid effusion noted as well. MRI NECK FINDINGS: Study moderately degraded by motion artifact. Previously identified right nasopharyngeal mass again seen. Overall,  this lesion measures approximately 22 x 34 x 28 mm on this exam (AP by transverse by craniocaudad). Mass appears to extend into the petrous right temporal bone, and is intimately associated with the petrous right ICA (series 20, image 4). Subtle asymmetric dural thickening seen just superiorly at the floor of the left middle cranial fossa may reflect early skullbase invasion or possibly reactive changes (Series 22, image 16). Asymmetric enhancement within the right eustachian tube again concerning for possible tumoral invasion. Postobstructive right mastoid effusion noted. 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 Associated intimal enhancement, suggesting that this is acute in nature. Previously questioned left palatine tonsillar carcinoma is not clearly seen on this exam. The left tonsil itself is mildly prominent as compared to the right with relative partial effacement of the left parapharyngeal fat. However, no associated enhancement. 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 Mild asymmetric prominence with fullness within the adjacent right oropharyngeal mucosa. Inferiorly, remainder of the hypopharynx and supraglottic larynx unremarkable. True cords grossly normal. Subglottic airway clear. 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 Changes are worse within the right mandibular body as compared to the left. Suspected post radiation changes within the bilateral submandibular spaces, slightly greater on the left. No appreciable adenopathy identified within the neck. Thyroid gland is normal. Salivary glands including the parotid and submandibular glands within normal limits. Visualized upper mediastinum grossly unremarkable. Partially visualized lungs are grossly clear. Visualized  osseous structures demonstrate no acute abnormality. Moderate multilevel degenerate spondylolysis noted within the visualized cervical spine, greatest at C6-7. IMPRESSION: MRI HEAD IMPRESSION: 1. No acute intracranial process identified. 2. Mild chronic microvascular ischemic disease. MRI NECK IMPRESSION: 1. 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 Mass involves the petrous aspect of the right temporal bone superiorly, and is intimately associated with the petrous right ICA. Subtle asymmetric dural thickening and enhancement within the adjacent left middle cranial fossa may reflect early skullbase/intracranial extension and/or reactive changes. 2. 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 3. Asymmetric prominence of the left palatine tonsil without associated enhancement, with asymmetric enhancement within the right palatine tonsil (see above discussion). Correlation with direct visualization recommended. 4. 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 Again, possible infection/osteomyelitis not excluded. Electronically Signed   By: Jeannine Boga M.D.   On: 01/01/2017 05:36    Assessment/Plan 1. 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 Transient cerebral ischemia, unspecified type -cont PT, OT for his generalized weakness -keep f/u Dr. Leonie Man  4. ICAO (internal carotid artery occlusion), right -was cause of #3, but pt reports he was told he didn't have a TIA and it was muscle  spasms--d/c summary says it was a TIA  6. Hypotension, unspecified hypotension type -had lopressor reduced to 12.5mg  po bid this am and too early to tell if this will help  7. Chronic systolic congestive heart failure ([place]) -could benefit from entresto if his other meds can be lowered to accomodate its' diuretic effect in place of the ace that was stopped in the hospital  8. Atrial fibrillation with RVR ([facility]) -cont lopressor 12.5mg  po bid, dig, amio  9. Dysphagia, unspecified type -ongoing, working with ST, drinking a lot of fluids he says, but then they are all setting around his room near full -cont aspiration precautions  Palliative care consult due to ongoing mouth pain complaints despite the viscous lidodaine I increased to q2h today and to discuss goals of care given overall poor health status with his heart failure. Obstructed internal carotid and now recurrent head and neck cancer--does have DNR code status  Family/ staff Communication: discussed with nursing  Labs/tests ordered:  Bmp 2/5  Annastacia Duba L. Danen Lapaglia, D.O. [facility] [address] [place], [place] [address] Cell Phone (Mon-Fri 8am-5pm):  [phone] On Call:  [phone] & follow prompts after 5pm & weekends Office Phone:  [phone] Office Fax:  [phone]

## 2017-01-17 NOTE — Assessment & Plan Note (Signed)
Found incidentally on MRI head during hospitalization last week. He has a history of head and neck cancer, does not know the origin but went through chemotherapy x2 rounds and radiation. Was treated at the [facility] by Dr. Guinevere Scarlet ph# [phone], F# [phone]. Has apt scheduled with ENT Dr. Constance Holster 2/12, may have biopsy performed at that time.  -referral to oncology

## 2017-01-17 NOTE — Assessment & Plan Note (Signed)
Home dose of metoprolol was increased to 25mg  BID from 12.5 mg BID in an attempt to achieve rate control during recent hospitalization. Today he is hypotensive and bradycardic and describes lightheadedness with standing. He was found to have right ICA occlusion as well so preventing hypotension to allow for perfusion through this area of stenosis is vital. - Decreased to metoprolol 12.5 mg BID  -RTC 1 month

## 2017-01-17 NOTE — Assessment & Plan Note (Signed)
Currently in regular rhythm. CHADSVASC =3. Meds include amiodarone and digoxin for rhythm control, metoprolol for rate control, and Eliquis for secondary stroke prevention.  -He is overly beta blocked today so I have decreased metoprolol to 12.5 mg BID ( was previously on 25 mg BID) -continue current medication regiment.

## 2017-01-17 NOTE — Telephone Encounter (Signed)
Oncology Nurse Navigator Documentation  Placed introductory phone call to Mr. Kaiser. He confirmed he has appt with ENT Dr. Constance Holster 01/20/17 0900.   He understands I will contact him early next week to schedule an appt with Dr. Alvy Bimler.  I provided him my contact information, encouraged him to call with questions/concerns. He voiced understanding, appreciation for my call.  [name], RN, BSN, [place] [facility] Oncology Nurse [employer] [facility] [place] [phone]

## 2017-01-17 NOTE — Assessment & Plan Note (Signed)
Recently presented to the ED with transient left leg tremor and found to have high grade carotid lesions. Symptoms have resolved. He is on Eliquis for secondary stroke prevention.  -continue Eliquis

## 2017-01-17 NOTE — Progress Notes (Signed)
A user error has taken place: encounter opened in error, closed for administrative reasons.

## 2017-01-21 ENCOUNTER — Other Ambulatory Visit: Payer: Self-pay | Admitting: Otolaryngology

## 2017-01-21 ENCOUNTER — Telehealth: Payer: Self-pay | Admitting: *Deleted

## 2017-01-21 DIAGNOSIS — Z923 Personal history of irradiation: Secondary | ICD-10-CM

## 2017-01-21 DIAGNOSIS — K1379 Other lesions of oral mucosa: Secondary | ICD-10-CM

## 2017-01-21 DIAGNOSIS — K089 Disorder of teeth and supporting structures, unspecified: Secondary | ICD-10-CM

## 2017-01-21 NOTE — Telephone Encounter (Signed)
Oncology Nurse Navigator Documentation  Spoke with Dr. Janeice Robinson MA Tyler Holland, requested as part of yesterday's post-visit with Tyler Holland, he order CT neck with contrast (including entire base of skull due to nasopharyngeal primary) and staging PET; Dr. Janeice Robinson Office Visit note indicated he was going to order CT neck.  Tracey voiced understanding.  [name], RN, BSN, [place] [facility] Oncology Nurse [place] [facility] [place] [phone]

## 2017-01-22 LAB — BASIC METABOLIC PANEL
BUN: 16 mg/dL (ref 4–21)
Creatinine: 0.7 mg/dL (ref 0.6–1.3)
Glucose: 111 mg/dL
Potassium: 4.4 mmol/L (ref 3.4–5.3)
Sodium: 136 mmol/L — AB (ref 137–147)

## 2017-01-24 ENCOUNTER — Encounter: Payer: Self-pay | Admitting: Adult Health

## 2017-01-24 ENCOUNTER — Non-Acute Institutional Stay (SKILLED_NURSING_FACILITY): Payer: Medicare Other | Admitting: Adult Health

## 2017-01-24 DIAGNOSIS — J392 Other diseases of pharynx: Secondary | ICD-10-CM

## 2017-01-24 DIAGNOSIS — I429 Cardiomyopathy, unspecified: Secondary | ICD-10-CM | POA: Diagnosis not present

## 2017-01-24 DIAGNOSIS — K1379 Other lesions of oral mucosa: Secondary | ICD-10-CM | POA: Diagnosis not present

## 2017-01-24 DIAGNOSIS — I4891 Unspecified atrial fibrillation: Secondary | ICD-10-CM

## 2017-01-24 DIAGNOSIS — Z720 Tobacco use: Secondary | ICD-10-CM | POA: Diagnosis not present

## 2017-01-24 DIAGNOSIS — I959 Hypotension, unspecified: Secondary | ICD-10-CM

## 2017-01-24 DIAGNOSIS — I6521 Occlusion and stenosis of right carotid artery: Secondary | ICD-10-CM | POA: Diagnosis not present

## 2017-01-24 DIAGNOSIS — I712 Thoracic aortic aneurysm, without rupture: Secondary | ICD-10-CM | POA: Diagnosis not present

## 2017-01-24 DIAGNOSIS — I7121 Aneurysm of the ascending aorta, without rupture: Secondary | ICD-10-CM

## 2017-01-24 DIAGNOSIS — G459 Transient cerebral ischemic attack, unspecified: Secondary | ICD-10-CM | POA: Diagnosis not present

## 2017-01-24 DIAGNOSIS — R531 Weakness: Secondary | ICD-10-CM

## 2017-01-24 DIAGNOSIS — H6591 Unspecified nonsuppurative otitis media, right ear: Secondary | ICD-10-CM | POA: Diagnosis not present

## 2017-01-24 NOTE — Progress Notes (Signed)
DATE:    01/24/17   MRN:  [id number]  BIRTHDAY: [birth year]  Facility:  Nursing Home Location:  [place] Room Number: [id number]  LEVEL OF CARE:  SNF (31)  Contact Information    Name Relation Home Work Mobile   Tyler Holland Relative [phone]         Code Status History    Date Active Date Inactive Code Status Order ID Comments User Context   01/02/2017  7:55 AM 01/08/2017  8:26 PM DNR [id number]  Tyler Filler, MD Inpatient   01/01/2017  3:44 AM 01/01/2017  3:44 AM DNR [id number]  Tyler Kocher, MD ED   01/01/2017  3:44 AM 01/02/2017  7:55 AM DNR [id number]  Tyler Kocher, MD ED    Questions for Most Recent Historical Code Status (Order [id number])    Question Answer Comment   In the event of cardiac or respiratory ARREST Do not call a "code blue"    In the event of cardiac or respiratory ARREST Do not perform Intubation, CPR, defibrillation or ACLS    In the event of cardiac or respiratory ARREST Use medication by any route, position, wound care, and other measures to relive pain and suffering. May use oxygen, suction and manual treatment of airway obstruction as needed for comfort.        Chief Complaint  Patient presents with  . Discharge Note    HISTORY OF PRESENT ILLNESS:  This is a 71-YO male who is for discharge home with Home health PT, OT, CNA and Nursing.  He was admitted to [facility] on 01/08/17 following an admission at [facility] 12/31/2016-01/08/2017 fall due to an acute onset of uncontrollable LLE tremor and significant weakness.  In the ED he was noted to be in A. Fib with RVR and borderline hypotension. Cardiology was consulted. He was initially on IV Cardizem and subsequently transitioned to IV amiodarone. 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  2-D echo done with EF of 25-30%. There was a concern for probable TIA and stroke 4, done. He is on Eliquis for stroke prevention.  Patient was admitted  to this facility for short-term rehabilitation after the patient's recent hospitalization.  Patient has completed SNF rehabilitation and therapy has cleared the patient for discharge.   PAST MEDICAL HISTORY:  Past Medical History:  Diagnosis Date  . A-fib ([place])   . Ascending aortic aneurysm ([place])   . Atrial flutter ([place])   . 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 Cardiomyopathy ([place])   . Chronic systolic congestive heart failure ([place]) 01/16/2017  . Dysphagia   . 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 Hypertension   . ICAO (internal carotid artery occlusion), right   . Nasopharyngeal mass   . Prostate cancer ([place]) 2002  . Status post radiation 09/2011  . TIA (transient ischemic attack) 12/2016  . Transient left leg weakness      CURRENT MEDICATIONS: Reviewed  Patient's Medications  New Prescriptions   No medications on file  Previous Medications   AMIODARONE (PACERONE) 200 MG TABLET    Take 1 tablet (200 mg total) by mouth 2 (two) times daily.   APIXABAN (ELIQUIS) 5 MG TABS TABLET    Take 1 tablet (5 mg total) by mouth 2 (two) times daily.   ATORVASTATIN (LIPITOR) 20 MG TABLET    Take 1 tablet (20 mg total) by mouth daily.   DIGOXIN (LANOXIN) 0.25 MG  TABLET    Take 1 tablet (0.25 mg total) by mouth daily.   HYDROCODONE-ACETAMINOPHEN (NORCO) 7.5-325 MG TABLET    Take 0.5-1 tablets by mouth every 6 (six) hours as needed for moderate pain.   LIDOCAINE (XYLOCAINE) 2 % SOLUTION    Use as directed 15 mLs in the mouth or throat every 4 (four) hours as needed for mouth pain.   MAGNESIUM OXIDE (MAG-OX) 400 MG TABLET    Take 400 mg by mouth daily.   METOPROLOL SUCCINATE (TOPROL-XL) 25 MG 24 HR TABLET    Take 0.5 tablets (12.5 mg total) by mouth 2 (two) times daily.   NICOTINE (NICODERM CQ - DOSED IN MG/24 HOURS) 14 MG/24HR PATCH    Place 14 mg onto the skin daily.   NICOTINE (NICODERM CQ - DOSED IN MG/24 HR) 7 MG/24HR PATCH    Place 7 mg onto the skin daily. x1 week and then DC   SPIRONOLACTONE  (ALDACTONE) 25 MG TABLET    Take 1 tablet (25 mg total) by mouth daily.   UNABLE TO FIND    Med Name: Med pass 120 mL by mouth daily  Modified Medications   No medications on file  Discontinued Medications   CLONAZEPAM (KLONOPIN) 0.5 MG TABLET    Take 0.5 mg by mouth 2 (two) times daily as needed for anxiety.   NICOTINE (NICODERM CQ - DOSED IN MG/24 HOURS) 21 MG/24HR PATCH    Place 21 mg onto the skin daily.     Allergies  Allergen Reactions  . Fish Allergy Swelling    Fresh water fish caused throat to swell (child)  . Penicillins Other (See Comments)    Unknown childhood allergic reaction     REVIEW OF SYSTEMS:  GENERAL: no change in appetite, no fatigue, no weight changes, no fever, chills or weakness EYES: Denies change in vision, dry eyes, eye pain, itching or discharge EARS: Denies change in hearing, ringing in ears, or earache NOSE: Denies nasal congestion or epistaxis MOUTH and THROAT: + mouth pain RESPIRATORY: no cough, SOB, DOE, wheezing, hemoptysis CARDIAC: no chest pain, edema or palpitations GI: no abdominal pain, diarrhea, constipation, heart burn, nausea or vomiting GU: Denies dysuria, frequency, hematuria, incontinence, or discharge PSYCHIATRIC: Denies feeling of depression or anxiety. No report of hallucinations, insomnia, paranoia, or agitation   PHYSICAL EXAMINATION  GENERAL APPEARANCE: Well nourished. In no acute distress. Normal body habitus SKIN:  Skin is warm and dry.  HEAD: Normal in size and contour. No evidence of trauma EYES: Lids open and close normally. No blepharitis, entropion or ectropion. PERRL. Conjunctivae are clear and sclerae are white. Lenses are without opacity EARS: Pinnae are normal. Patient hears normal voice tunes of the examiner MOUTH and THROAT: Lips are without lesions. Tongue is normal in shape, size, and color and without lesions, +dental caries NECK: supple, trachea midline, no neck masses, no thyroid tenderness, no  thyromegaly RESPIRATORY: breathing is even & unlabored, BS CTAB CARDIAC: RRR, no murmur,no extra heart sounds, no edema GI: abdomen soft, normal BS, no masses, no tenderness, no hepatomegaly, no splenomegaly EXTREMITIES:  Able to move X 4 extremities, BLE with generalized weakness (left weaker than right) PSYCHIATRIC: Alert and oriented X 3. Affect and behavior are appropriate    LABS/RADIOLOGY: Labs reviewed: Basic Metabolic Panel:  Recent Labs  01/06/17 0137 01/07/17 0222 01/08/17 0718 01/13/17 01/22/17  NA 133* 134* 128* 132* 136*  K 4.0 3.9 4.2 4.4 4.4  CL 98* 98* 95*  --   --  CO2 26 28 25   --   --   GLUCOSE 105* 97 97  --   --   BUN 11 13 9 12 16   CREATININE 0.69 0.64 0.66 0.7 0.7  CALCIUM 8.8* 8.7* 8.7*  --   --   MG 1.7 1.7 1.6*  --   --    Liver Function Tests:  Recent Labs  12/31/16 1441 01/01/17 0934  AST 22 21  ALT 15* 15*  ALKPHOS 75 60  BILITOT 1.3* 1.0  PROT 6.8 5.5*  ALBUMIN 3.2* 2.7*    Recent Labs  12/31/16 1441  LIPASE 19   CBC:  Recent Labs  01/01/17 0934  01/05/17 0207 01/06/17 0137 01/08/17 0718  WBC 7.1  < > 6.7 5.8 7.2  NEUTROABS 5.8  --   --   --   --   HGB 15.4  < > 14.8 14.9 15.8  HCT 45.2  < > 43.9 44.5 46.5  MCV 99.8  < > 99.1 99.1 98.7  PLT 152  < > 175 192 289  < > = values in this interval not displayed. Lipid Panel:  Recent Labs  01/01/17 1507  HDL 37*   Cardiac Enzymes:  Recent Labs  12/31/16 1850 01/01/17 0934 01/01/17 1516 01/01/17 2119  CKTOTAL 80  --   --   --   TROPONINI 0.03* <0.03 <0.03 <0.03     Dg Chest 2 View  Result Date: 12/31/2016 CLINICAL DATA:  Patient fell 5 days ago. For age trial fibrillation, atrial flutter and hypertension EXAM: CHEST  2 VIEW COMPARISON:  None. FINDINGS: Cardiomegaly with aortic atherosclerosis. No pneumonic consolidation. Bibasilar atelectasis and/or scarring left greater than right. No overt pulmonary edema or pneumothorax. Vascular calcifications are seen  bilaterally along the axillary arteries. IMPRESSION: Cardiomegaly with aortic atherosclerosis. No acute pulmonary disease. Bibasilar atelectasis and/or scarring. Electronically Signed   By: Ashley Royalty M.D.   On: 12/31/2016 20:03   Dg Wrist Complete Left  Result Date: 12/31/2016 CLINICAL DATA:  Fall with pain.  Patient fell 5 days ago. EXAM: LEFT WRIST - COMPLETE 3+ VIEW COMPARISON:  None. FINDINGS: No fracture. No subluxation or dislocation. Degenerative changes are seen in the first carpometacarpal joint. IMPRESSION: 1. No acute findings. 2. Degenerative changes first carpometacarpal joint. Electronically Signed   By: Misty Stanley M.D.   On: 12/31/2016 20:01   Ct Head Wo Contrast  Result Date: 12/31/2016 CLINICAL DATA:  Fall 3 days ago.  Headache.  Initial encounter. EXAM: CT HEAD WITHOUT CONTRAST TECHNIQUE: Contiguous axial images were obtained from the base of the skull through the vertex without intravenous contrast. COMPARISON:  None. FINDINGS: Brain: No evidence of acute infarction, hemorrhage, hydrocephalus, extra-axial collection or mass lesion/mass effect. Mild diffuse cerebral atrophy and chronic small vessel disease. Vascular: No hyperdense vessel or unexpected calcification. Skull: Normal. Negative for fracture or focal lesion. Sinuses/Orbits: Right mastoid effusion noted. Other: None. IMPRESSION: No acute intracranial abnormality. Mild cerebral atrophy and chronic small vessel disease. Right mastoid effusion. Electronically Signed   By: Earle Gell M.D.   On: 12/31/2016 20:36   Ct Soft Tissue Neck W Contrast  Addendum Date: 12/31/2016   ADDENDUM REPORT: 12/31/2016 21:45 ADDENDUM: Postobstructive RIGHT middle ear and mastoid effusions. Bone windows now submitted. Slight widening and irregularity of the RIGHT bony eustachian tube concerning for tumoral invasion. Electronically Signed   By: Elon Alas M.D.   On: 12/31/2016 21:45   Result Date: 12/31/2016 CLINICAL DATA:  Oral mass.  Hypotensive, weakness. History  of LEFT retroauricular cancer, prostate cancer. EXAM: CT NECK WITH CONTRAST TECHNIQUE: Multidetector CT imaging of the neck was performed using the standard protocol following the bolus administration of intravenous contrast. CONTRAST:  75 cc  ISOVUE-300 IOPAMIDOL (ISOVUE-300) INJECTION 61% COMPARISON:  None. FINDINGS: Pharynx and larynx: Approximately 16 x 22 x 19 mm mass RIGHT nasopharyngeal/ upper palatine tonsillar mass effacing the RIGHT fossa of Rosenmller, possible invasion of the RIGHT cavernous sinus, incompletely assessed. Hypopharyngeal edema compatible with post radiation change narrowing the LEFT vallecula. Patent piriform sinuses. Asymmetric fullness LEFT palatine tonsil with LEFT parapharyngeal fat stranding. Amorphous LEFT pterygoid muscles. Salivary glands: Fat stranding bilateral submandibular space. Thyroid: Normal. Lymph nodes: Matted LEFT lateral pharyngeal lymph nodes. VASCULAR: Dense presumed hematoma RIGHT internal carotid artery origin with occlusion of the RIGHT internal carotid artery to the included cavernous segment. Limited intracranial: Normal. Visualized orbits: Normal. Mastoids and visualized paranasal sinuses: Bilateral maxillary sinus mucosal retention cyst. RIGHT middle ear in mastoid effusion. Small LEFT mastoid effusion. Skeleton: Poor dentition with multiple dental caries, periapical lucencies with bony re- absorption of the LEFT greater than RIGHT alveolar ridge and LEFT angle of the mandible. Chronic deformity RIGHT club proximal clavicle. Multilevel severe degenerative changes cervical spine. Upper chest: Ascending aortic aneurysm at 4.2 cm with moderate calcific atherosclerosis. Other: Thickened platysma. IMPRESSION: Limited assessment due to lack of prior imaging. 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 16 x 22 x 19 mm  RIGHT skullbase mass, suspicious for metastatic disease, less likely second nasopharyngeal neoplasm. Possible skullbase invasion would be better characterized on MRI neck with contrast. 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 **An incidental finding of potential clinical significance has been found. 4.2 cm ascending aortic aneurysm. Recommend annual imaging followup by CTA or MRA. This recommendation follows 2010 ACCF/AHA/AATS/ACR/ASA/SCA/SCAI/SIR/STS/SVM Guidelines for the Diagnosis and Management of Patients with Thoracic Aortic Disease. Circulation. 2010; [id number]** Acute findings discussed with and reconfirmed by PA CHRISTOPHER LAWYER on 12/31/2016 at 9:07 pm. Electronically Signed: By: Elon Alas M.D. On: 12/31/2016 21:09   Mr Jodene Nam Neck W Wo Contrast  Result Date: 01/02/2017 CLINICAL DATA:  Right carotid artery occlusion. History of head neck cancer diagnosed in 2012 with treatment in [place] including chemotherapy and radiation therapy. EXAM: MRA NECK WITHOUT AND WITH CONTRAST TECHNIQUE: Multiplanar and multiecho pulse sequences of the neck were obtained without and with intravenous contrast. Angiographic images of the neck were obtained using MRA technique without and with intravenous contrast. CONTRAST:  63mL MULTIHANCE GADOBENATE DIMEGLUMINE 529 MG/ML IV SOLN COMPARISON:  None. FINDINGS: Three vessel arch branching. Abrupt occlusion of the right internal carotid artery at its origin. The lumen is visible and distended along most of its length, compatible with an acute occlusion. Enhanced seen, necrotic appearing right nasopharyngeal mass which involves the skullbase at the level of the carotid canal. This is the favored causes of occlusion. The patient has changes of head neck radiotherapy for cancer, and there could be contributory ICA stenosis in this  setting. Faint flow in the right cavernous ICA which progressively normalizes. The right MCA and ACA vessels are likely under filled based on vessel size when compared to the left. Tiny anterior and bilateral posterior communicating artery is noted. The left carotid circulation shows ICA tortuosity without stenosis. Left dominant vertebrobasilar system. There is narrowing at both vertebral artery ostia,  with flow gap on the left. No proximal subclavian stenosis. IMPRESSION: 1. Right ICA occlusion beginning at the bulb. The nonenhancing lumen is not collapsed, suggesting recent occlusion. 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 Patient has changes of head and neck radiotherapy, and obscured ICA stenosis may contribute. Right cavernous ICA reconstitution; limited circle-of-Willis collateral flow due to small communicating arteries. 2. Bilateral advanced vertebral ostial stenoses. Electronically Signed   By: Monte Fantasia M.D.   On: 01/02/2017 11:35   Mr Jeri Cos [id number] Contrast  Result Date: 01/01/2017 CLINICAL DATA:  Initial evaluation for transient left leg weakness, tremor. EXAM: MRI HEAD WITHOUT AND WITH CONTRAST TECHNIQUE: Multiplanar, multiecho pulse sequences of the brain and surrounding structures were obtained without and with intravenous contrast. CONTRAST:  15 cc of MultiHance. COMPARISON:  Prior CT from 12/31/2016. FINDINGS: Brain: Diffuse prominence of the CSF containing spaces is compatible with generalized cerebral atrophy. Patchy and confluent T2/FLAIR hyperintensity within the periventricular and deep white matter both cerebral hemispheres most compatible chronic small vessel ischemic disease, mild in nature. Mild chronic microvascular ischemic changes noted within the pons as well. No abnormal foci of restricted diffusion to suggest acute or subacute ischemia. Gray-white matter differentiation maintained. No evidence for chronic  infarction. No evidence for acute or chronic intracranial hemorrhage. No mass lesion, midline shift, or mass effect. No hydrocephalus. No extra-axial fluid collection. Minimal serpiginous enhancement within the left occipital lobe favored to reflect a small DVA (series 23, image 14). No other abnormal enhancement. Pituitary gland and suprasellar region within normal limits. Vascular: Abnormal flow void within the right ICA and, compatible with previous identified right ICA occlusion. Major intracranial vascular flow voids otherwise maintained. Skull and upper cervical spine: Asymmetric fullness with enhancement at the right nasopharynx, compatible with previous identified nasopharyngeal mass. This measures approximately 15 x 27 mm on this exam (series 23, image 4). Finding concerning for possible recurrent or metastatic disease. There is asymmetric dural thickening with enhancement along the floor of the adjacent anteromedial left middle cranial fossa (series 24, image 21). Craniocervical junction within normal limits. Visualized upper cervical spine unremarkable. Bone marrow signal intensity normal. No scalp soft tissue abnormality. Sinuses/Orbits: Globes and orbital soft tissues within normal limits. Scattered mucosal thickening within the maxillary sinuses and ethmoidal air cells. No air-fluid level to suggest active sinus infection. Right-sided mastoid effusion, likely postobstructive. Small left mastoid effusion noted as well. MRI NECK FINDINGS: Study moderately degraded by motion artifact. Previously identified right nasopharyngeal mass again seen. Overall, this lesion measures approximately 22 x 34 x 28 mm on this exam (AP by transverse by craniocaudad). Mass appears to extend into the petrous right temporal bone, and is intimately associated with the petrous right ICA (series 20, image 4). Subtle asymmetric dural thickening seen just superiorly at the floor of the left middle cranial fossa may reflect early  skullbase invasion or possibly reactive changes (Series 22, image 16). Asymmetric enhancement within the right eustachian tube again concerning for possible tumoral invasion. Postobstructive right mastoid effusion noted. 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 Associated intimal enhancement, suggesting that this is acute in nature. Previously questioned left palatine tonsillar carcinoma is not clearly seen on this exam. The left tonsil itself is mildly prominent as compared to the right with relative partial effacement of the left parapharyngeal fat. However, no associated enhancement. In fact, there is relative hyperenhancement at the posterior and medial  aspect of the right palatine tonsil, suspected to be related to the nasopharyngeal tumor (series 20, image 12). Mild asymmetric prominence with fullness within the adjacent right oropharyngeal mucosa. Inferiorly, remainder of the hypopharynx and supraglottic larynx unremarkable. True cords grossly normal. Subglottic airway clear. 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 Changes are worse within the right mandibular body as compared to the left. Suspected post radiation changes within the bilateral submandibular spaces, slightly greater on the left. No appreciable adenopathy identified within the neck. Thyroid gland is normal. Salivary glands including the parotid and submandibular glands within normal limits. Visualized upper mediastinum grossly unremarkable. Partially visualized lungs are grossly clear. Visualized osseous structures demonstrate no acute abnormality. Moderate multilevel degenerate spondylolysis noted within the visualized cervical spine, greatest at C6-7. IMPRESSION: MRI HEAD IMPRESSION: 1. No acute intracranial process identified. 2. Mild chronic microvascular ischemic disease. MRI NECK IMPRESSION: 1. 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 Mass involves the petrous aspect of the right temporal bone superiorly, and is intimately associated with the petrous right ICA. Subtle asymmetric dural thickening and enhancement within the adjacent left middle cranial fossa may reflect early skullbase/intracranial extension and/or reactive changes. 2. 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 3. Asymmetric prominence of the left palatine tonsil without associated enhancement, with asymmetric enhancement within the right palatine tonsil (see above discussion). Correlation with direct visualization recommended. 4. 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 Again, possible infection/osteomyelitis not excluded. Electronically Signed   By: Jeannine Boga M.D.   On: 01/01/2017 05:36   Mr Neck Soft Tissue Only W Wo Contrast  Result Date: 01/01/2017 CLINICAL DATA:  Initial evaluation for transient left leg weakness, tremor. EXAM: MRI HEAD WITHOUT AND WITH CONTRAST TECHNIQUE: Multiplanar, multiecho pulse sequences of the brain and surrounding structures were obtained without and with intravenous contrast. CONTRAST:  15 cc of MultiHance. COMPARISON:  Prior CT from 12/31/2016. FINDINGS: Brain: Diffuse prominence of the CSF containing spaces is compatible with generalized cerebral atrophy. Patchy and confluent T2/FLAIR hyperintensity within the periventricular and deep white matter both cerebral hemispheres most compatible chronic small vessel ischemic disease, mild in nature. Mild chronic microvascular ischemic changes noted within the pons as well. No abnormal foci of restricted diffusion to suggest acute or subacute ischemia. Gray-white matter differentiation maintained. No evidence for chronic  infarction. No evidence for acute or chronic intracranial hemorrhage. No mass lesion, midline shift, or mass effect. No hydrocephalus. No extra-axial fluid collection. Minimal serpiginous enhancement within the left occipital lobe favored to reflect a small DVA (series 23, image 14). No other abnormal enhancement. Pituitary gland and suprasellar region within normal limits. Vascular: Abnormal flow void within the right ICA and, compatible with previous identified right ICA occlusion. Major intracranial vascular flow voids otherwise maintained. Skull and upper cervical spine: Asymmetric fullness with enhancement at the right nasopharynx, compatible with previous identified nasopharyngeal mass. This measures approximately 15 x 27 mm on this exam (series 23, image 4). Finding concerning for possible recurrent or metastatic disease. There is asymmetric dural thickening with enhancement along the floor of the adjacent anteromedial left middle cranial fossa (series 24, image 21). Craniocervical junction within normal limits. Visualized upper cervical spine unremarkable. Bone marrow signal intensity normal. No scalp soft tissue  abnormality. Sinuses/Orbits: Globes and orbital soft tissues within normal limits. Scattered mucosal thickening within the maxillary sinuses and ethmoidal air cells. No air-fluid level to suggest active sinus infection. Right-sided mastoid effusion, likely postobstructive. Small left mastoid effusion noted as well. MRI NECK FINDINGS: Study moderately degraded by motion artifact. Previously identified right nasopharyngeal mass again seen. Overall, this lesion measures approximately 22 x 34 x 28 mm on this exam (AP by transverse by craniocaudad). Mass appears to extend into the petrous right temporal bone, and is intimately associated with the petrous right ICA (series 20, image 4). Subtle asymmetric dural thickening seen just superiorly at the floor of the left middle cranial fossa may reflect early  skullbase invasion or possibly reactive changes (Series 22, image 16). Asymmetric enhancement within the right eustachian tube again concerning for possible tumoral invasion. Postobstructive right mastoid effusion noted. 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 Associated intimal enhancement, suggesting that this is acute in nature. Previously questioned left palatine tonsillar carcinoma is not clearly seen on this exam. The left tonsil itself is mildly prominent as compared to the right with relative partial effacement of the left parapharyngeal fat. However, no associated enhancement. 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 Mild asymmetric prominence with fullness within the adjacent right oropharyngeal mucosa. Inferiorly, remainder of the hypopharynx and supraglottic larynx unremarkable. True cords grossly normal. Subglottic airway clear. 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 Changes are worse within the right mandibular body as compared to the left. Suspected post radiation changes within the bilateral submandibular spaces, slightly greater on the left. No appreciable adenopathy identified within the neck. Thyroid gland is normal. Salivary glands including the parotid and submandibular glands within normal limits. Visualized upper mediastinum grossly unremarkable. Partially visualized lungs are grossly clear. Visualized osseous structures demonstrate no acute abnormality. Moderate multilevel degenerate spondylolysis noted within the visualized cervical spine, greatest at C6-7. IMPRESSION: MRI HEAD IMPRESSION: 1. No acute intracranial process identified. 2. Mild chronic microvascular ischemic disease. MRI NECK IMPRESSION: 1. 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 Mass involves the petrous aspect of the right temporal bone superiorly, and is intimately associated with the petrous right ICA. Subtle asymmetric dural thickening and enhancement within the adjacent left middle cranial fossa may reflect early skullbase/intracranial extension and/or reactive changes. 2. 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 3. Asymmetric prominence of the left palatine tonsil without associated enhancement, with asymmetric enhancement within the right palatine tonsil (see above discussion). Correlation with direct visualization recommended. 4. 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 Again, possible infection/osteomyelitis not excluded. Electronically Signed   By: Jeannine Boga M.D.   On: 01/01/2017 05:36   Dg Chest Port 1 View  Result Date: 01/03/2017 CLINICAL DATA:  Chest congestion.  Shortness of breath. EXAM: PORTABLE CHEST 1 VIEW COMPARISON:  12/31/2016 FINDINGS: The cardiac silhouette is stably enlarged. Mediastinal contours appear intact. There is no evidence of pneumothorax. There are mildly increased interstitial markings. Patchy airspace consolidation is seen in the right more than left lower lobes. There is probable left lower lobe  atelectasis. Small subpulmonic effusions cannot be excluded. Osseous structures are without acute abnormality. Soft tissues are grossly normal. IMPRESSION: Interval development of mild increase of the interstitial markings and patchy airspace consolidation in right more than left lower lobe. This may represent multifocal pneumonia or asymmetric interstitial pulmonary edema. Electronically Signed   By: Fidela Salisbury M.D.   On: 01/03/2017 13:17   Dg Swallowing Func-speech  Pathology  Result Date: 01/06/2017 Objective Swallowing Evaluation: Type of Study: MBS-Modified Barium Swallow Study Patient Details Name: Jordain Kreller MRN: [id number] Date of Birth: 07/30/1946 Today's Date: 01/06/2017 Time: SLP Start Time (ACUTE ONLY): 1417-SLP Stop Time (ACUTE ONLY): 1430 SLP Time Calculation (min) (ACUTE ONLY): 13 min Past Medical History: Past Medical History: Diagnosis Date . A-fib ([place])  . Atrial flutter ([place])  . 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 Hypertension  . Prostate cancer ([place]) 2002 . Status post radiation 09/2011 Past Surgical History: Past Surgical History: Procedure Laterality Date . PROSTATECTOMY  2002 HPI: 71 y.o. gentleman with a history of head and neck cancer (diagnosed in 2012, treated in [place] with chemotherapy and radiation, details in dental clinic visit progress note from 11/25/16 by Dr. Enrique Sack), prostate cancer, HTN, and atrial fibrillation/flutter (CHADS-Vasc score of 3, he is not anticoagulated) who presents to the ED on 1/24/18for evaluation after having two falls at home on Saturday. The patient reports that he has had two distinct episodes of acute onset, involuntary, uncontrollable left leg tremor that ultimately causes him to fall. Both times, he has had increased difficulty getting back on his feet, reporting that it takes him 30 minutes to "recover". He denies LOC or associated confusion/post-ictal state. Likely secondary to probable TIA in the setting of high-grade carotid lesion/carotid stenosis. 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 States that he has dysphagia over the past 2 months and severe pain in him mouth. CXR 1/26: Interval  development of mild increase of the interstitial markings and patchy airspace consolidation in right more than left lower lobe. This may represent multifocal pneumonia or asymmetric interstitial pulmonary edema. Note that patient seen by Dentist on 12/18 who noted Oral mass involving lower right quadrant. Patient given options including possible biopsy but has not made a decision to proceed.  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 He has weak lingual propulsion and incomplete velopharyngeal closure that allows for nasal regurgitation of all consistencies. This mostly clears after the swallow as nasopharyngeal residue spills downward into the pharynx, but larger straw sips do travel deeper through the nasal cavity. He has a delayed swallow trigger that results in flash penetration of thin liquids via straw as well. Pt has decreased ability to establish intraoral pressure to assist in bolus propulsion through the pharynx, resulting in moderate, diffuse residue post-swallow. This can be reduced with a cued second swallow, but attempt at a chin tuck results in increased nasal regurgitation. Recommend to continue with Dys 1 diet and thin liquids by cup, although with strict control of bolus size and use of second swallow per bolus. Will continue  to follow.  Impact on safety and function Mild aspiration risk;Moderate aspiration risk   CHL IP TREATMENT RECOMMENDATION 01/06/2017 Treatment Recommendations Therapy as outlined in treatment plan below   Prognosis 01/06/2017 Prognosis for Safe Diet Advancement Fair Barriers to Reach Goals Other (Comment) Barriers/Prognosis Comment -- CHL IP DIET RECOMMENDATION 01/06/2017 SLP Diet Recommendations Dysphagia 1 (Puree)  solids;Thin liquid Liquid Administration via Cup;No straw Medication Administration Crushed with puree Compensations Slow rate;Small sips/bites;Multiple dry swallows after each bite/sip Postural Changes Remain semi-upright after after feeds/meals (Comment);Seated upright at 90 degrees   CHL IP OTHER RECOMMENDATIONS 01/06/2017 Recommended Consults Consider ENT evaluation Oral Care Recommendations Oral care BID Other Recommendations Have oral suction available   CHL IP FOLLOW UP RECOMMENDATIONS 01/06/2017 Follow up Recommendations Skilled Nursing facility   [facility] IP FREQUENCY AND DURATION 01/06/2017 Speech Therapy Frequency (ACUTE ONLY) min 2x/week Treatment Duration 2 weeks      CHL IP ORAL PHASE 01/06/2017 Oral Phase Impaired Oral - Pudding Teaspoon -- Oral - Pudding Cup -- Oral - Honey Teaspoon -- Oral - Honey Cup -- Oral - Nectar Teaspoon -- Oral - Nectar Cup -- Oral - Nectar Straw -- Oral - Thin Teaspoon -- Oral - Thin Cup Decreased velopharyngeal closure;Nasal reflux;Weak lingual manipulation Oral - Thin Straw Decreased velopharyngeal closure;Nasal reflux;Weak lingual manipulation Oral - Puree Decreased velopharyngeal closure;Nasal reflux;Weak lingual manipulation Oral - Mech Soft -- Oral - Regular -- Oral - Multi-Consistency -- Oral - Pill -- Oral Phase - Comment --  CHL IP PHARYNGEAL PHASE 01/06/2017 Pharyngeal Phase Impaired Pharyngeal- Pudding Teaspoon -- Pharyngeal -- Pharyngeal- Pudding Cup -- Pharyngeal -- Pharyngeal- Honey Teaspoon -- Pharyngeal -- Pharyngeal- Honey Cup -- Pharyngeal -- Pharyngeal- Nectar Teaspoon -- Pharyngeal -- Pharyngeal- Nectar Cup -- Pharyngeal -- Pharyngeal- Nectar Straw -- Pharyngeal -- Pharyngeal- Thin Teaspoon -- Pharyngeal -- Pharyngeal- Thin Cup Delayed swallow initiation-pyriform sinuses;Pharyngeal residue - valleculae;Pharyngeal residue - pyriform Pharyngeal -- Pharyngeal- Thin Straw Delayed swallow initiation-pyriform sinuses;Pharyngeal residue - valleculae;Pharyngeal residue -  pyriform;Penetration/Aspiration before swallow Pharyngeal Material enters airway, remains ABOVE vocal cords then ejected out Pharyngeal- Puree Delayed swallow initiation-pyriform sinuses;Pharyngeal residue - valleculae;Pharyngeal residue - pyriform Pharyngeal -- Pharyngeal- Mechanical Soft -- Pharyngeal -- Pharyngeal- Regular -- Pharyngeal -- Pharyngeal- Multi-consistency -- Pharyngeal -- Pharyngeal- Pill -- Pharyngeal -- Pharyngeal Comment --  CHL IP CERVICAL ESOPHAGEAL PHASE 01/06/2017 Cervical Esophageal Phase WFL Pudding Teaspoon -- Pudding Cup -- Honey Teaspoon -- Honey Cup -- Nectar Teaspoon -- Nectar Cup -- Nectar Straw -- Thin Teaspoon -- Thin Cup -- Thin Straw -- Puree -- Mechanical Soft -- Regular -- Multi-consistency -- Pill -- Cervical Esophageal Comment -- CHL IP GO 01/02/2017 Functional Assessment Tool Used (No Data) Functional Limitations Other Speech Language Pathology Swallow Current Status [phone]) (None) Swallow Goal Status [id number]) (None) Swallow Discharge Status [id number]) (None) Motor Speech Current Status [id number]) (None) Motor Speech Goal Status [id number]) (None) Motor Speech Goal Status [id number]) (None) Spoken Language Comprehension Current Status [id number]) (None) Spoken Language Comprehension Goal Status [id number]) (None) Spoken Language Comprehension Discharge Status [id number]) (None) Spoken Language Expression Current Status [phone]) (None) Spoken Language Expression Goal Status [id number]) (None) Spoken Language Expression Discharge Status [id number]) (None) Attention Current Status [id number]) (None) Attention Goal Status [id number]) (None) Attention Discharge Status [id number]) (None) Memory Current Status [id number]) (None) Memory Goal Status [id number]) (None) Memory Discharge Status [id number]) (None) Voice Current Status [id number]) (None) Voice Goal Status [id number]) (None) Voice Discharge Status [id number]) (None) Other Speech-Language Pathology Functional Limitation [id number]) CH Other Speech-Language Pathology Functional Limitation Goal Status  [id number]) (None)  Other Speech-Language Pathology Functional Limitation Discharge Status [phone]) (None) Tyler Holland 01/06/2017, 3:12 PM  Tyler Holland, M.A. CCC-SLP [phone]              ASSESSMENT/PLAN:  Generalized weakness  - for Home health PT and OT, or therapeutic strengthening exercises; fall precautions  TIA - had left leg tremors/weakness, in the setting of high-grade carotid lesion/carotid stenosis; continue  Eliquis 5 mg BID; follow-up with neurology in 1 month  Atrial fibrillation with RVR - rate controlled;  EF of 25-30% with global hypokinesis; continue amiodarone 200 mg twice a day and Eliquis 5 mg BID, continue digoxin 0.25 mg 1 tab by mouth daily and metoprolol 12.5 mg 1 tab by mouth twice a day  Post obstructive right middle ear and mastoid effusions - was recently seen on 01/20/17 by ENT,  Dr. 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 Constance Holster on 01/20/17 and has ordered CT neck with contrast  Acute complete occlusion of right ICA - continue Eliquis; neurology, Dr. 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  Patient will receive home health PT, OT Nursing and CNA.  DME provided:  Rolling walker  Total discharge time: Greater than 30 minutes Greater than 50% was spent in counseling and coordination of care with the patient.   Discharge time involved coordination of the discharge process with social worker, nursing staff and therapy department. Medical justification for home health services/DME verified.    Cherisse Carrell C. [place] - NP [employer] [phone]

## 2017-02-10 ENCOUNTER — Telehealth: Payer: Self-pay | Admitting: *Deleted

## 2017-02-10 NOTE — Telephone Encounter (Signed)
Oncology Nurse Navigator Documentation  LVMM for Tyler Holland asking for update on CT ordered by ENT Dr. Constance Holster, update on his well-being.  [name], RN, BSN, [place] [facility] Oncology Nurse [place] at [place] [phone]

## 2017-02-20 ENCOUNTER — Telehealth: Payer: Self-pay | Admitting: General Practice

## 2017-02-20 ENCOUNTER — Telehealth: Payer: Self-pay | Admitting: *Deleted

## 2017-02-20 NOTE — Telephone Encounter (Signed)
Oncology Nurse Navigator Documentation  Spoke with Tyler Holland nephew, [name], who informed his uncle began hospice care at [facility] last week.  I notified ENT Dr. Janeice Robinson office.  [name], RN, BSN, [place] [facility] Oncology Nurse [place] at [place] [phone]

## 2017-02-20 NOTE — Telephone Encounter (Signed)
APT. REMINDER CALL, NO ANSWER, MAILBOX FULL °

## 2017-02-21 ENCOUNTER — Encounter: Payer: Self-pay | Admitting: Internal Medicine

## 2017-03-05 ENCOUNTER — Ambulatory Visit: Payer: Medicare Other | Admitting: Neurology

## 2017-04-08 DEATH — deceased

## 2017-11-27 IMAGING — MR MR HEAD WO/W CM
12 of 23 series · 26 of 48 positions shown · IV contrast (Yes M)
Comparison: Prior CT from 12/31/2016.

CLINICAL DATA: Initial evaluation for transient left leg weakness,
tremor.

EXAM:
MRI HEAD WITHOUT AND WITH CONTRAST
TECHNIQUE: Multiplanar, multiecho pulse sequences of the brain and surrounding
structures were obtained without and with intravenous contrast.
CONTRAST:  15 cc of MultiHance.

[Series 2: FLAIR · sagittal · 5.0mm · 0.47mm/px · 2 of 25 slices shown (1 of 2)]
[im 1/25]
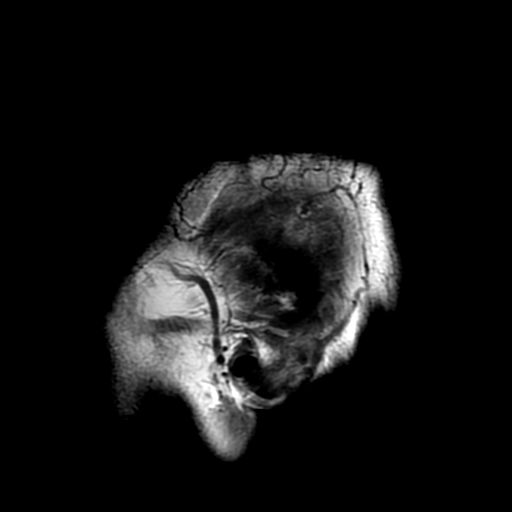
[im 25/25]
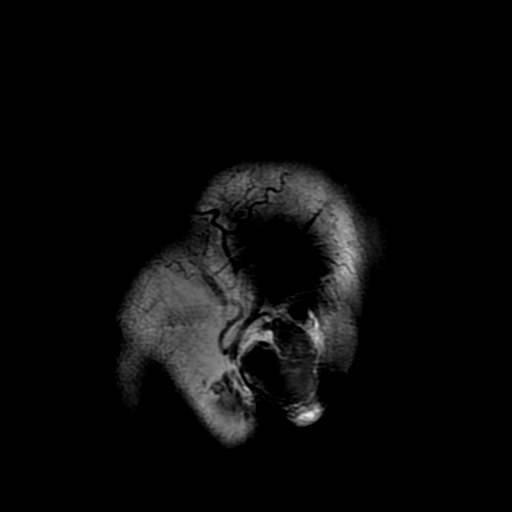

[Series 4: DWI · axial · 3.0mm · 0.94mm/px · z∈[-20,+127]mm · 6 of 98 slices shown (1 of 2)]
[im 1/98]
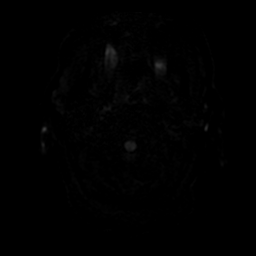
[im 20/98]
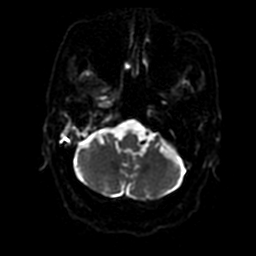
[im 39/98]
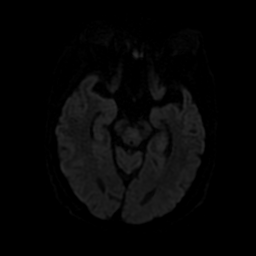
[im 59/98]
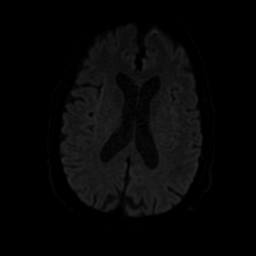
[im 78/98]
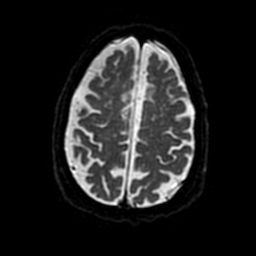
[im 98/98]
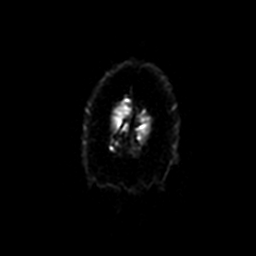

[Series 5: T2 · axial · 5.0mm · 0.47mm/px · z∈[-18,+126]mm · 2 of 25 slices shown (1 of 2)]
[im 1/25]
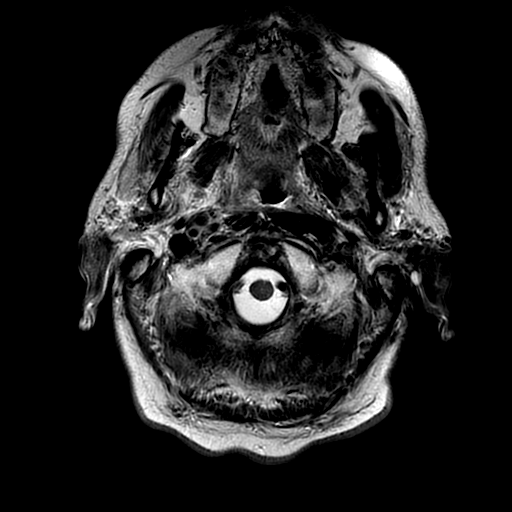
[im 25/25]
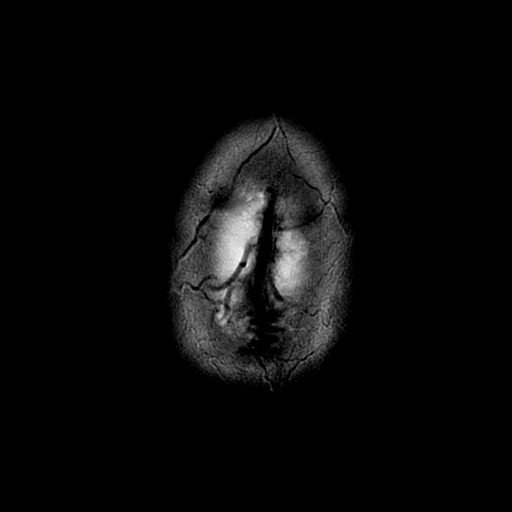

[Series 6: FLAIR · axial · 5.0mm · 0.47mm/px · 1 of 25 slices shown (2 of 2)]
[im 1/25]
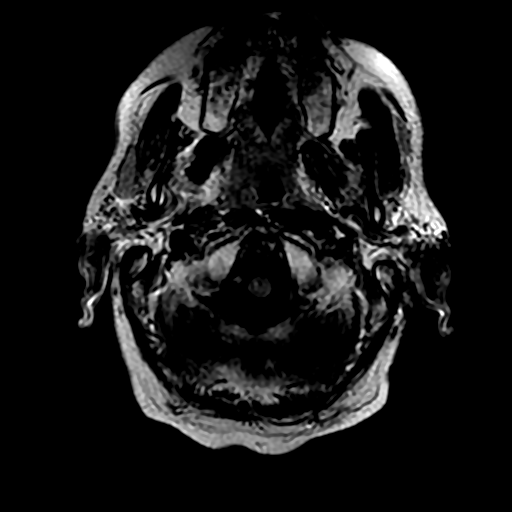

[Series 7: DWI · coronal · 4.0mm · 0.94mm/px · 4 of 78 slices shown (2 of 2)]
[im 1/78]
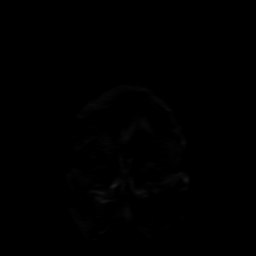
[im 26/78]
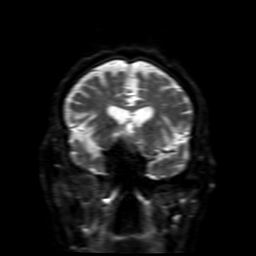
[im 52/78]
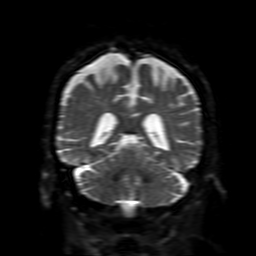
[im 78/78]
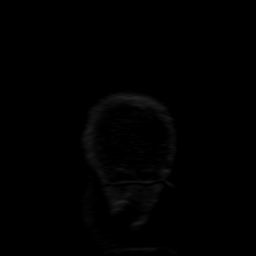

[Series 8: GRE · axial · 5.0mm · 0.47mm/px · 1 of 25 slices shown]
[im 1/25]
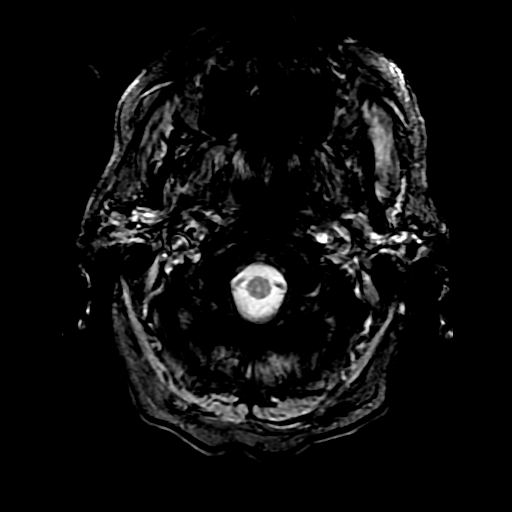

[Series 9: T1 · axial · 5.0mm · 0.47mm/px · 1 of 25 slices shown (1 of 2)]
[im 1/25]
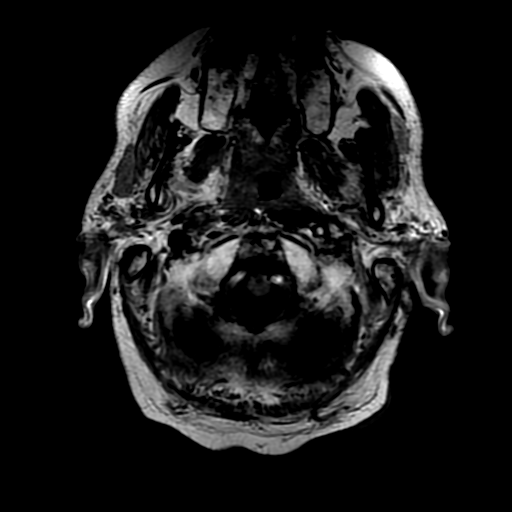

[Series 11: T2 · coronal · 5.0mm · 0.39mm/px · 2 of 33 slices shown (2 of 2)]
[im 1/33]
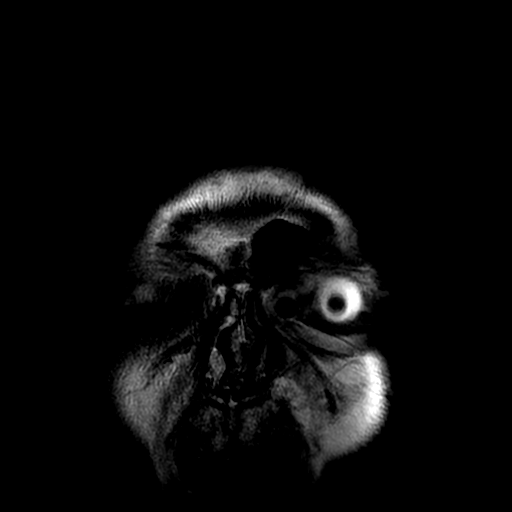
[im 33/33]
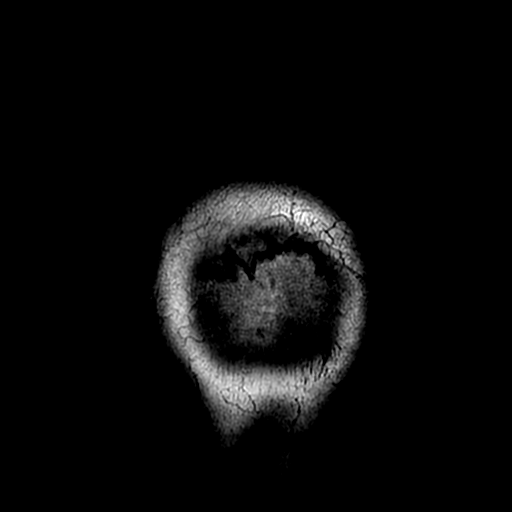

[Series 13: T1 · sagittal · 4.0mm · 0.47mm/px · 1 of 27 slices shown (2 of 2)]
[im 1/27]
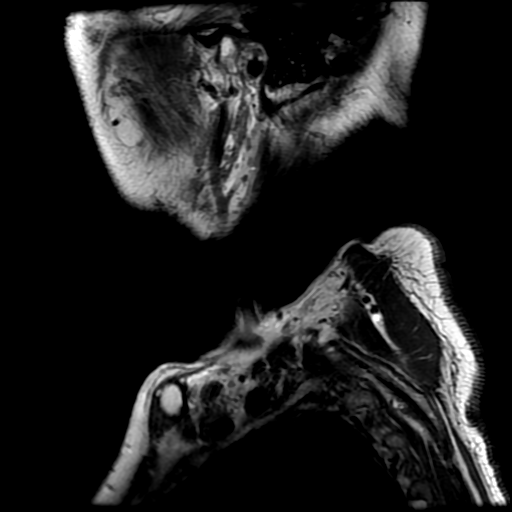

[Series 25: FLAIR post-contrast · sagittal · 5.0mm · 0.47mm/px · 1 of 25 slices shown]
[im 1/25]
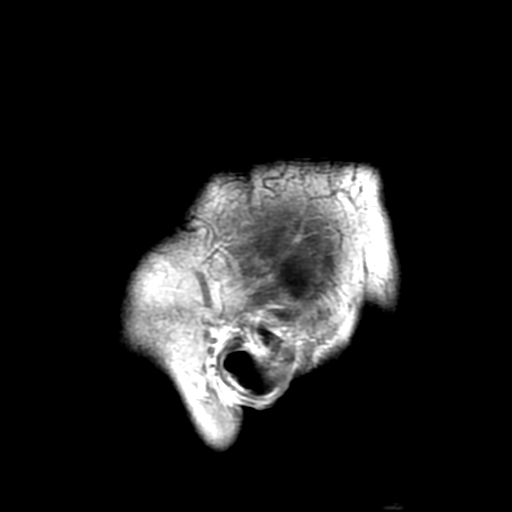

[Series 450: ADC · axial · 3.0mm · 0.94mm/px · z∈[-20,+127]mm · 3 of 48 slices shown (1 of 2)]
[im 1/48]
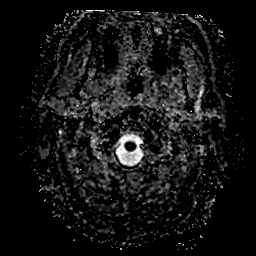
[im 24/48]
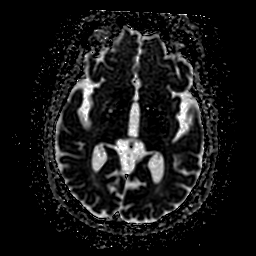
[im 48/48]
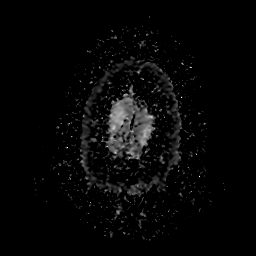

[Series 750: ADC · coronal · 4.0mm · 0.94mm/px · 2 of 39 slices shown (2 of 2)]
[im 1/39]
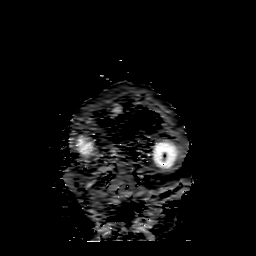
[im 39/39]
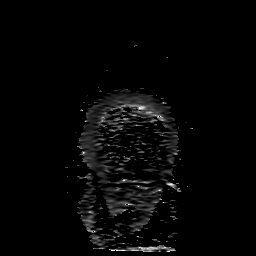

[26 of 48 positions shown; findings below may reference images not displayed]

FINDINGS: Brain: Diffuse prominence of the CSF containing spaces is compatible
with generalized cerebral atrophy. Patchy and confluent T2/FLAIR
hyperintensity within the periventricular and deep white matter both
cerebral hemispheres most compatible chronic small vessel ischemic
disease, mild in nature. Mild chronic microvascular ischemic changes
noted within the pons as well.

No abnormal foci of restricted diffusion to suggest acute or
subacute ischemia. Gray-white matter differentiation maintained. No
evidence for chronic infarction. No evidence for acute or chronic
intracranial hemorrhage.

No mass lesion, midline shift, or mass effect. No hydrocephalus. No
extra-axial fluid collection. Minimal serpiginous enhancement within
the left occipital lobe favored to reflect a small DVA (series 23,
image 14). No other abnormal enhancement.

Pituitary gland and suprasellar region within normal limits.

Vascular: Abnormal flow void within the right ICA and, compatible
with previous identified right ICA occlusion. Major intracranial
vascular flow voids otherwise maintained.

Skull and upper cervical spine: Asymmetric fullness with enhancement
at the right nasopharynx, compatible with previous identified
nasopharyngeal mass. This measures approximately 15 x 27 mm on this
exam (series 23, image 4). Finding concerning for possible recurrent
or metastatic disease. There is asymmetric dural thickening with
enhancement along the floor of the adjacent anteromedial left middle
cranial fossa (series 24, image 21).

Craniocervical junction within normal limits. Visualized upper
cervical spine unremarkable. Bone marrow signal intensity normal. No
scalp soft tissue abnormality.

Sinuses/Orbits: Globes and orbital soft tissues within normal
limits.

Scattered mucosal thickening within the maxillary sinuses and
ethmoidal air cells. No air-fluid level to suggest active sinus
infection.

Right-sided mastoid effusion, likely postobstructive. Small left
mastoid effusion noted as well.

MRI NECK FINDINGS:

Study moderately degraded by motion artifact.

Previously identified right nasopharyngeal mass again seen. Overall,
this lesion measures approximately 22 x 34 x 28 mm on this exam (AP
by transverse by craniocaudad). Mass appears to extend into the
petrous right temporal bone, and is intimately associated with the
petrous right ICA (series 20, image 4). Subtle asymmetric dural
thickening seen just superiorly at the floor of the left middle
cranial fossa may reflect early skullbase invasion or possibly
reactive changes (Series 22, image 16). Asymmetric enhancement
within the right eustachian tube again concerning for possible
tumoral invasion. Postobstructive right mastoid effusion noted.
Complete occlusion of the right ICA from the bifurcation to the
circle of Willis again seen. Associated intimal enhancement,
suggesting that this is acute in nature.

Previously questioned left palatine tonsillar carcinoma is not
clearly seen on this exam. The left tonsil itself is mildly
prominent as compared to the right with relative partial effacement
of the left parapharyngeal fat. However, no associated enhancement.
In fact, there is relative hyperenhancement at the posterior and
medial aspect of the right palatine tonsil, suspected to be related
to the nasopharyngeal tumor (series 20, image 12). Mild asymmetric
prominence with fullness within the adjacent right oropharyngeal
mucosa. Inferiorly, remainder of the hypopharynx and supraglottic
larynx unremarkable. True cords grossly normal. Subglottic airway
clear.

Changes related to probable osteo radio necrosis involving the
mandible again seen. Changes are worse within the right mandibular
body as compared to the left. Suspected post radiation changes
within the bilateral submandibular spaces, slightly greater on the
left.

No appreciable adenopathy identified within the neck.

Thyroid gland is normal.

Salivary glands including the parotid and submandibular glands
within normal limits.

Visualized upper mediastinum grossly unremarkable. Partially
visualized lungs are grossly clear.

Visualized osseous structures demonstrate no acute abnormality.
Moderate multilevel degenerate spondylolysis noted within the
visualized cervical spine, greatest at C6-7.
IMPRESSION: MRI HEAD IMPRESSION:

1. No acute intracranial process identified.
2. Mild chronic microvascular ischemic disease.
MRI NECK IMPRESSION:

1. 22 x 34 x 28 mm right nasopharyngeal mass as above, stable from
prior neck CT, and again suspicious for possible metastatic disease
or possibly primary/recurrent nasopharyngeal carcinoma. Mass
involves the petrous aspect of the right temporal bone superiorly,
and is intimately associated with the petrous right ICA. Subtle
asymmetric dural thickening and enhancement within the adjacent left
middle cranial fossa may reflect early skullbase/intracranial
extension and/or reactive changes.
2. Acute appearing complete occlusion of the right ICA from the
bifurcation to the circle of Willis, possibly due to the tumor or
related to prior therapy.
3. Asymmetric prominence of the left palatine tonsil without
associated enhancement, with asymmetric enhancement within the right
palatine tonsil (see above discussion). Correlation with direct
visualization recommended.
4. Findings suggestive of osteoradionecrosis involving the mandible.
Again, possible infection/osteomyelitis not excluded.

## 2017-11-29 IMAGING — DX DG CHEST 1V PORT
1 series · 1 of 1 positions shown · non-contrast
Comparison: 12/31/2016

CLINICAL DATA: Chest congestion.  Shortness of breath.

EXAM:
PORTABLE CHEST 1 VIEW

[chest ap]
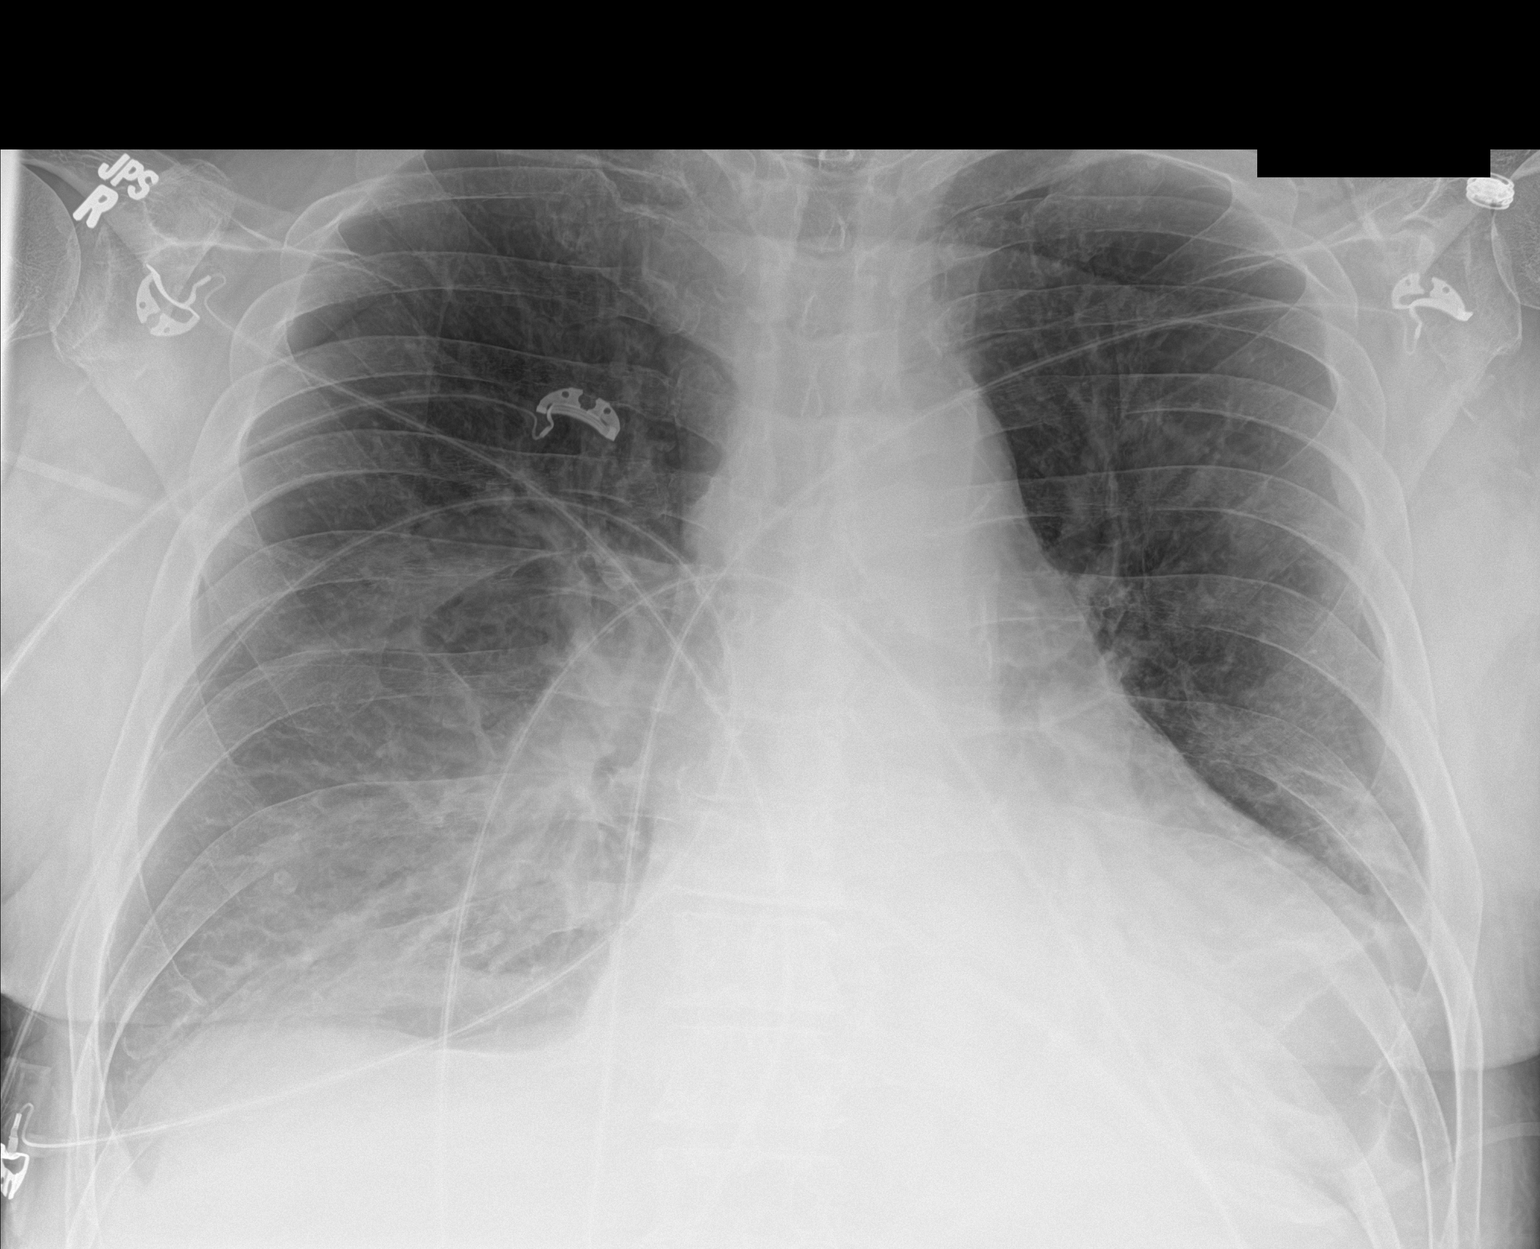

[1 of 1 positions shown; findings below may reference images not displayed]

FINDINGS: The cardiac silhouette is stably enlarged. Mediastinal contours
appear intact.

There is no evidence of pneumothorax. There are mildly increased
interstitial markings. Patchy airspace consolidation is seen in the
right more than left lower lobes. There is probable left lower lobe
atelectasis. Small subpulmonic effusions cannot be excluded.

Osseous structures are without acute abnormality. Soft tissues are
grossly normal.
IMPRESSION: Interval development of mild increase of the interstitial markings
and patchy airspace consolidation in right more than left lower
lobe. This may represent multifocal pneumonia or asymmetric
interstitial pulmonary edema.

## 2017-12-02 IMAGING — RF DG SWALLOWING FUNCTION - NRPT [facility]
1 series · 18 of 24 positions shown · non-contrast
Comparison: none

[Series 1: run · 16 acquisitions, 18 frames shown]
[im 1/16]
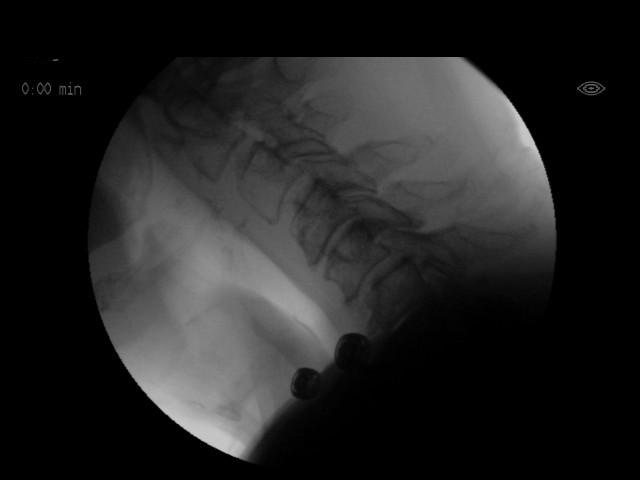
[im 2/16]
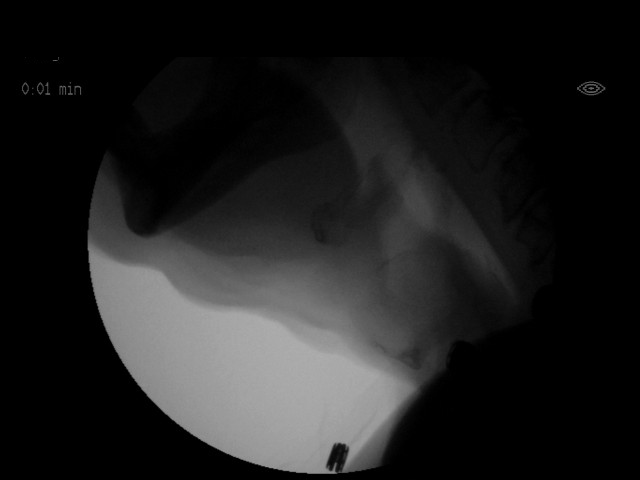
[im 3/16]
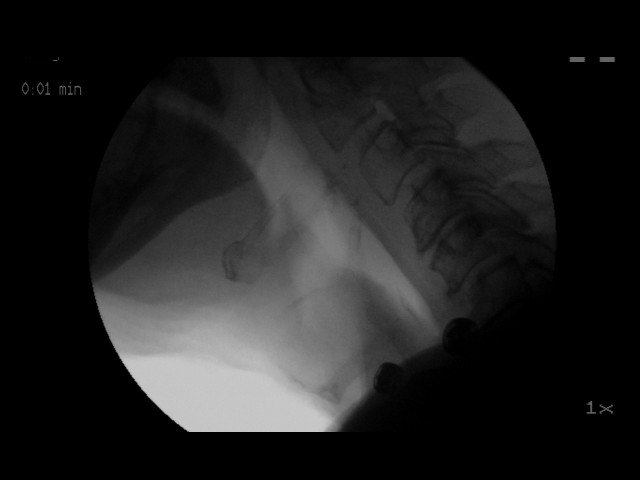
[im 3/16]
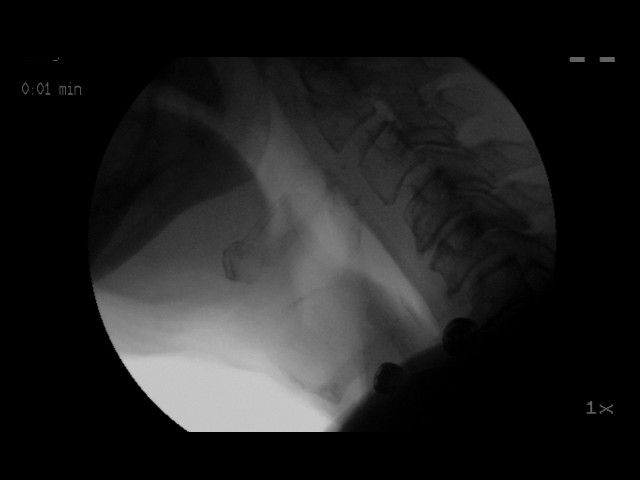
[im 5/16]
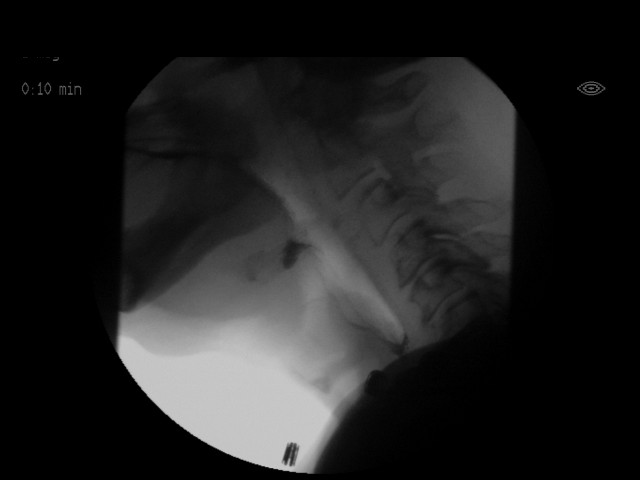
[im 5/16]
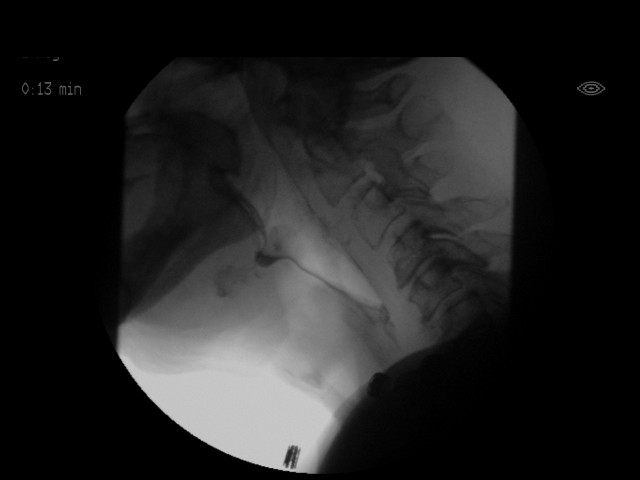
[im 6/16]
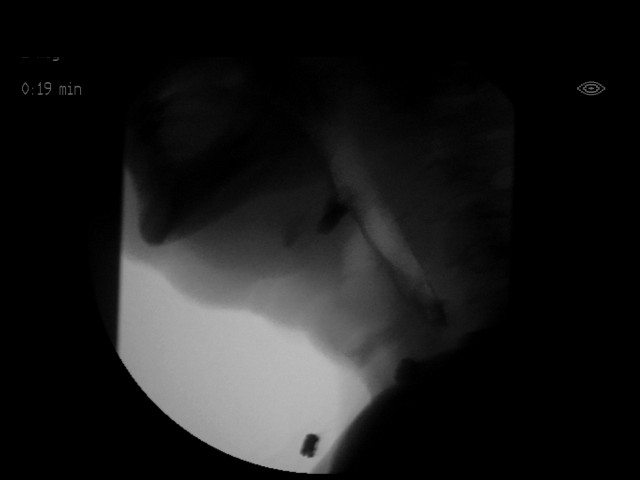
[im 7/16]
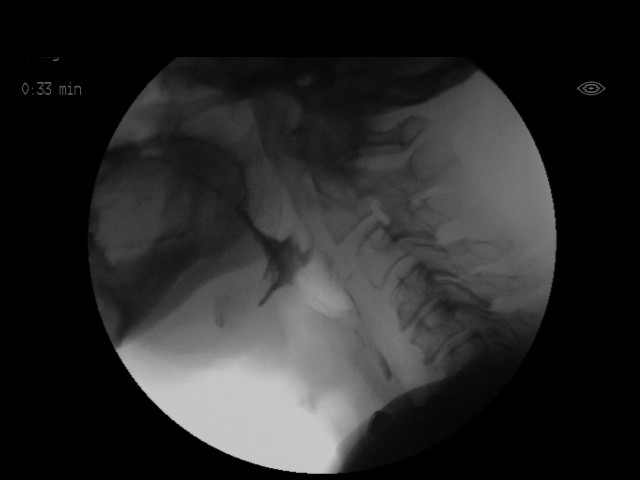
[im 8/16]
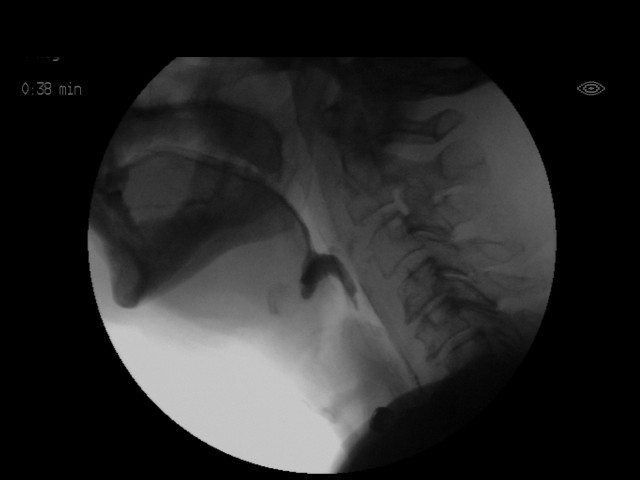
[im 9/16]
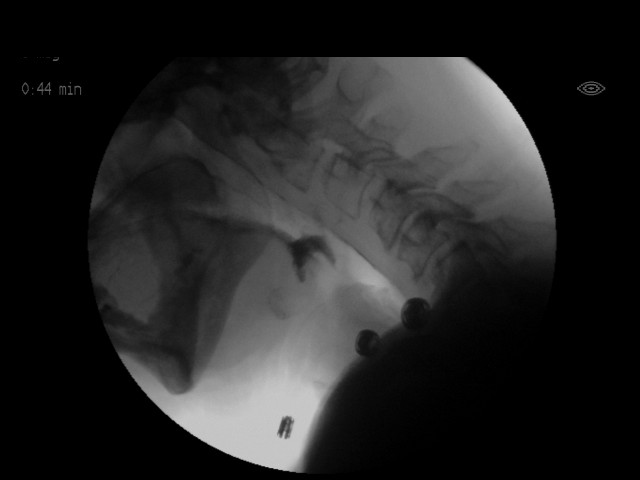
[im 10/16]
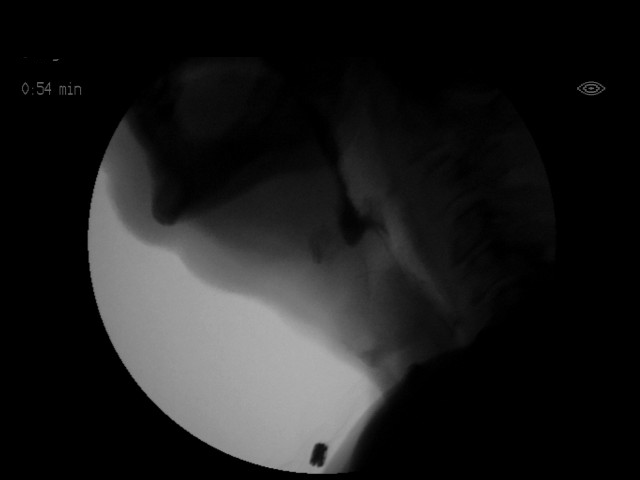
[im 11/16]
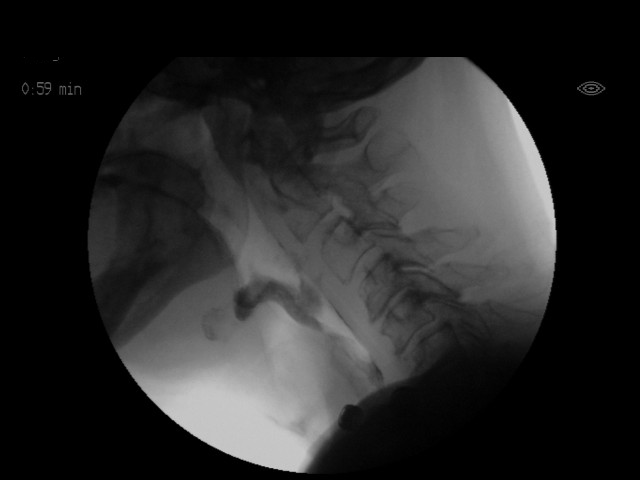
[im 12/16]
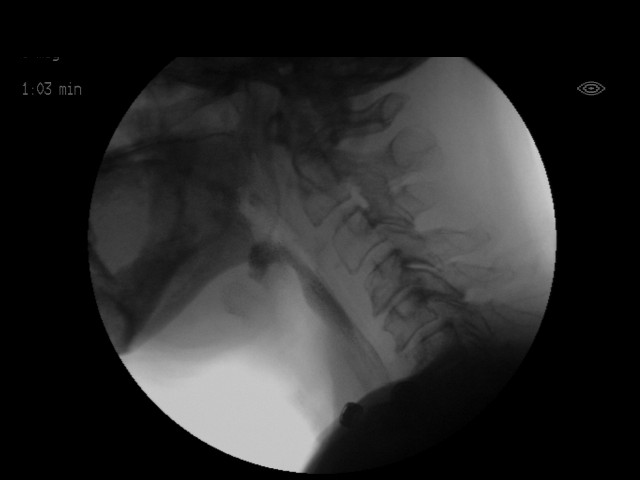
[im 13/16]
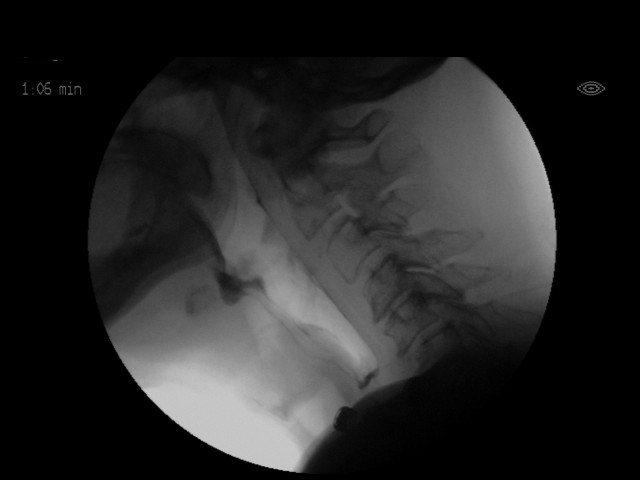
[im 14/16]
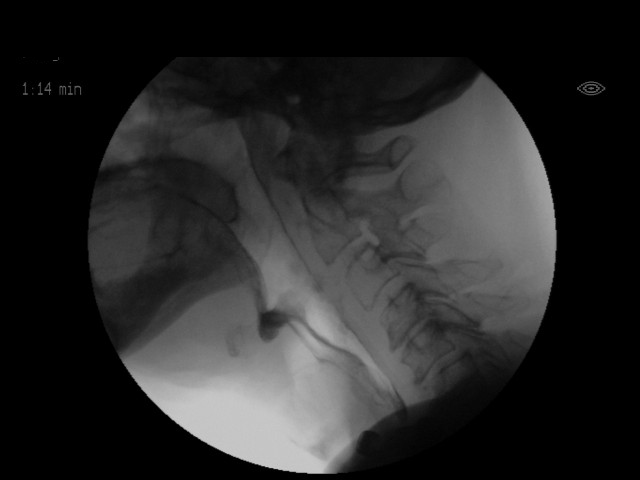
[im 14/16]
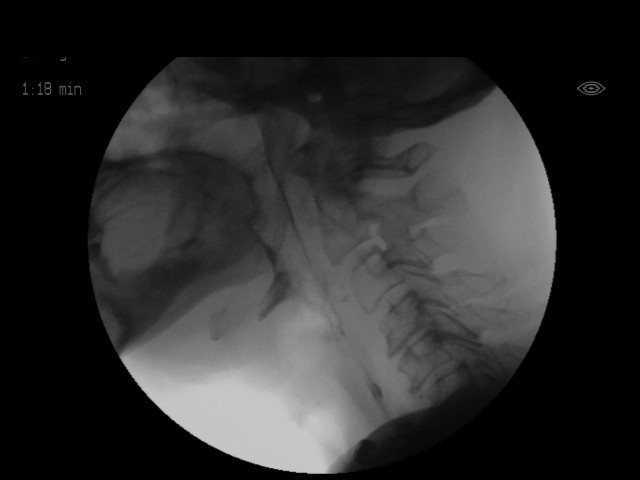
[im 16/16]
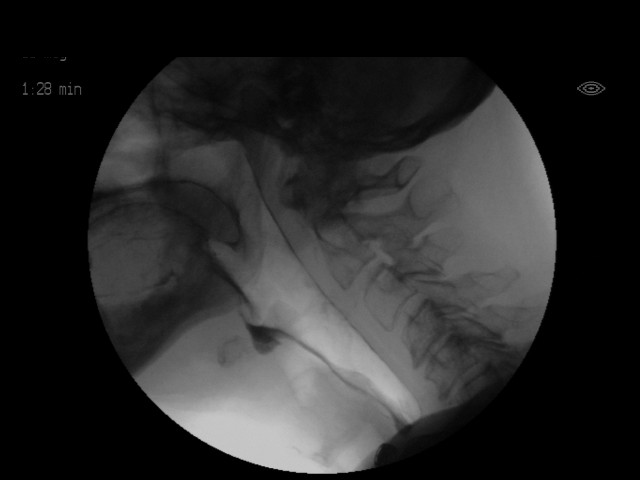
[im 16/16]
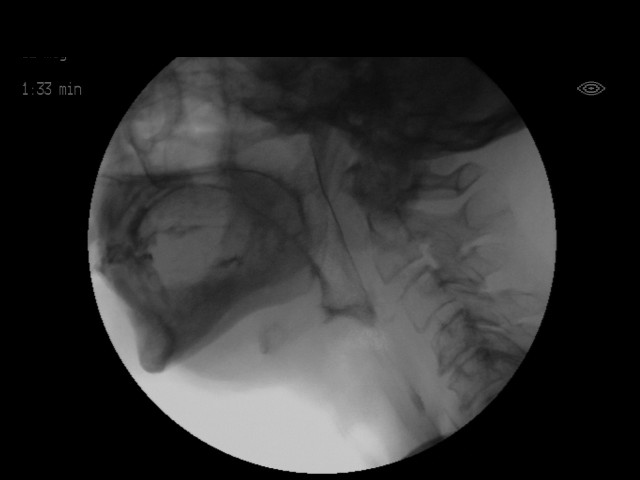

[18 of 24 positions shown; findings below may reference images not displayed]

FLUOROSCOPY FOR SWALLOWING FUNCTION STUDY:
Fluoroscopy was provided for swallowing function study, which was administered by a speech pathologist.  Final results and recommendations from this study are contained within the speech pathology report.
